# Patient Record
Sex: Male | Born: 1970 | Race: Black or African American | Hispanic: No | Marital: Married | Smoking: Never smoker
Health system: Southern US, Community
[De-identification: ages and names within clinical notes are randomized; demographics above are authoritative.]

## PROBLEM LIST (undated history)

## (undated) DIAGNOSIS — I519 Heart disease, unspecified: Principal | ICD-10-CM

## (undated) DIAGNOSIS — E119 Type 2 diabetes mellitus without complications: Secondary | ICD-10-CM

## (undated) DIAGNOSIS — N289 Disorder of kidney and ureter, unspecified: Secondary | ICD-10-CM

## (undated) DIAGNOSIS — E7800 Pure hypercholesterolemia, unspecified: Secondary | ICD-10-CM

## (undated) DIAGNOSIS — I160 Hypertensive urgency: Secondary | ICD-10-CM

## (undated) DIAGNOSIS — R079 Chest pain, unspecified: Secondary | ICD-10-CM

## (undated) DIAGNOSIS — I10 Essential (primary) hypertension: Secondary | ICD-10-CM

## (undated) HISTORY — DX: Essential (primary) hypertension: I10

## (undated) HISTORY — DX: Heart disease, unspecified: I51.9

## (undated) HISTORY — DX: Type 2 diabetes mellitus without complications: E11.9

## (undated) HISTORY — DX: Hypertensive urgency: I16.0

## (undated) HISTORY — DX: Pure hypercholesterolemia, unspecified: E78.00

## (undated) HISTORY — DX: Chest pain, unspecified: R07.9

## (undated) HISTORY — PX: KNEE ARTHROSCOPY: SUR90

---

## 2001-12-18 ENCOUNTER — Encounter: Admission: RE | Admit: 2001-12-18 | Discharge: 2002-03-18 | Payer: Self-pay | Admitting: Pulmonary Disease

## 2005-01-17 ENCOUNTER — Emergency Department (HOSPITAL_COMMUNITY): Admission: EM | Admit: 2005-01-17 | Discharge: 2005-01-17 | Payer: Self-pay | Admitting: Emergency Medicine

## 2005-01-22 ENCOUNTER — Ambulatory Visit: Payer: Self-pay | Admitting: Pulmonary Disease

## 2005-01-22 LAB — CONVERTED CEMR LAB
ALT: 42 units/L — ABNORMAL HIGH (ref 0–40)
AST: 28 units/L (ref 0–37)
BUN: 11 mg/dL (ref 6–23)
CRP, High Sensitivity: 16 — ABNORMAL HIGH (ref 0.00–5.00)
Calcium: 9.7 mg/dL (ref 8.4–10.5)
Chloride: 97 meq/L (ref 96–112)
Creatinine, Ser: 1 mg/dL (ref 0.5–1.7)
Potassium: 4.2 meq/L (ref 3.5–5.5)
TSH: 1.05 microintl units/mL (ref 0.35–5.50)

## 2005-06-17 ENCOUNTER — Encounter: Admission: RE | Admit: 2005-06-17 | Discharge: 2005-06-17 | Payer: Self-pay | Admitting: Family Medicine

## 2005-06-26 ENCOUNTER — Ambulatory Visit (HOSPITAL_COMMUNITY): Admission: RE | Admit: 2005-06-26 | Discharge: 2005-06-26 | Payer: Self-pay | Admitting: Orthopedic Surgery

## 2006-03-12 ENCOUNTER — Ambulatory Visit: Payer: Self-pay | Admitting: Pulmonary Disease

## 2006-10-27 ENCOUNTER — Ambulatory Visit: Payer: Self-pay | Admitting: Pulmonary Disease

## 2006-10-27 LAB — CONVERTED CEMR LAB
AST: 23 units/L (ref 0–37)
Albumin: 4 g/dL (ref 3.5–5.2)
Alkaline Phosphatase: 75 units/L (ref 39–117)
BUN: 10 mg/dL (ref 6–23)
Basophils Absolute: 0 10*3/uL (ref 0.0–0.1)
Basophils Relative: 0.7 % (ref 0.0–1.0)
CO2: 34 meq/L — ABNORMAL HIGH (ref 19–32)
Chloride: 101 meq/L (ref 96–112)
Creatinine, Ser: 1.1 mg/dL (ref 0.4–1.5)
HCT: 41.4 % (ref 39.0–52.0)
Hemoglobin: 14.3 g/dL (ref 13.0–17.0)
MCHC: 34.5 g/dL (ref 30.0–36.0)
Monocytes Absolute: 0.3 10*3/uL (ref 0.2–0.7)
Monocytes Relative: 5 % (ref 3.0–11.0)
Neutrophils Relative %: 56 % (ref 43.0–77.0)
Potassium: 4.1 meq/L (ref 3.5–5.1)
RBC: 5.11 M/uL (ref 4.22–5.81)
RDW: 12.6 % (ref 11.5–14.6)
TSH: 0.83 microintl units/mL (ref 0.35–5.50)
Total Bilirubin: 1.1 mg/dL (ref 0.3–1.2)
Total Protein: 7.2 g/dL (ref 6.0–8.3)

## 2006-11-25 ENCOUNTER — Ambulatory Visit: Payer: Self-pay | Admitting: Endocrinology

## 2007-03-20 ENCOUNTER — Encounter: Payer: Self-pay | Admitting: Endocrinology

## 2007-03-20 DIAGNOSIS — I10 Essential (primary) hypertension: Secondary | ICD-10-CM

## 2007-03-20 DIAGNOSIS — E119 Type 2 diabetes mellitus without complications: Secondary | ICD-10-CM

## 2007-03-20 HISTORY — DX: Type 2 diabetes mellitus without complications: E11.9

## 2007-03-20 HISTORY — DX: Essential (primary) hypertension: I10

## 2007-03-24 ENCOUNTER — Encounter: Admission: RE | Admit: 2007-03-24 | Discharge: 2007-03-24 | Payer: Self-pay | Admitting: Family Medicine

## 2007-06-25 ENCOUNTER — Telehealth: Payer: Self-pay | Admitting: Endocrinology

## 2008-07-21 ENCOUNTER — Ambulatory Visit: Payer: Self-pay | Admitting: Endocrinology

## 2008-07-21 DIAGNOSIS — E7800 Pure hypercholesterolemia, unspecified: Secondary | ICD-10-CM | POA: Insufficient documentation

## 2008-07-21 HISTORY — DX: Pure hypercholesterolemia, unspecified: E78.00

## 2008-07-21 LAB — CONVERTED CEMR LAB
Albumin: 4.1 g/dL (ref 3.5–5.2)
Alkaline Phosphatase: 68 units/L (ref 39–117)
BUN: 11 mg/dL (ref 6–23)
Bilirubin Urine: NEGATIVE
Calcium: 9.5 mg/dL (ref 8.4–10.5)
Cholesterol: 369 mg/dL (ref 0–200)
Creatinine, Ser: 1 mg/dL (ref 0.4–1.5)
Creatinine,U: 69.7 mg/dL
GFR calc Af Amer: 109 mL/min
Glucose, Bld: 408 mg/dL — ABNORMAL HIGH (ref 70–99)
HDL: 39.6 mg/dL (ref >39.0)
Hemoglobin, Urine: NEGATIVE
Ketones, ur: NEGATIVE mg/dL
Leukocytes, UA: NEGATIVE
Microalb Creat Ratio: 5.7 mg/g (ref 0.0–30.0)
Potassium: 3.9 meq/L (ref 3.5–5.1)
Total CHOL/HDL Ratio: 9.3
Total Protein: 7.5 g/dL (ref 6.0–8.3)
Triglycerides: 427 mg/dL (ref 0–149)
Urine Glucose: >=1000 mg/dL — CR
Urobilinogen, UA: 0.2 (ref 0.0–1.0)
VLDL: 85 mg/dL — ABNORMAL HIGH (ref 0–40)

## 2009-08-25 ENCOUNTER — Telehealth: Payer: Self-pay | Admitting: Endocrinology

## 2009-08-28 ENCOUNTER — Ambulatory Visit: Payer: Self-pay | Admitting: Endocrinology

## 2009-08-28 DIAGNOSIS — R079 Chest pain, unspecified: Secondary | ICD-10-CM | POA: Insufficient documentation

## 2009-08-28 HISTORY — DX: Chest pain, unspecified: R07.9

## 2009-08-31 ENCOUNTER — Telehealth (INDEPENDENT_AMBULATORY_CARE_PROVIDER_SITE_OTHER): Payer: Self-pay | Admitting: *Deleted

## 2009-09-04 ENCOUNTER — Ambulatory Visit: Payer: Self-pay | Admitting: Internal Medicine

## 2009-09-04 ENCOUNTER — Encounter (HOSPITAL_COMMUNITY): Admission: RE | Admit: 2009-09-04 | Discharge: 2009-11-07 | Payer: Self-pay | Admitting: Endocrinology

## 2009-09-04 ENCOUNTER — Ambulatory Visit: Payer: Self-pay

## 2009-09-20 ENCOUNTER — Telehealth: Payer: Self-pay | Admitting: Endocrinology

## 2010-02-08 ENCOUNTER — Telehealth: Payer: Self-pay | Admitting: Endocrinology

## 2010-08-07 NOTE — Progress Notes (Signed)
Summary: OV due  Phone Note Outgoing Call   Call placed by: Rebeca Alert MA,  February 08, 2010 8:45 AM Details for Reason: OV due Summary of Call: Per MD, pt is due for OV. Left message to callback office.  Follow-up for Phone Call        pt states that he will callback in to sched  OV Follow-up by: Rebeca Alert MA,  February 09, 2010 4:04 PM

## 2010-08-07 NOTE — Progress Notes (Signed)
  Phone Note Call from Patient Call back at Work Phone [phone]   Caller: Patient Summary of Call: pt called stating that he has made appt for CPX with MD 02/21. pt is requesting refill of Metformin, Actos and Januvia to [facility] [place] [facility] Initial call taken by: Crissie Sickles, [place],  August 25, 2009 3:21 PM    Prescriptions: JANUVIA 100 MG TABS (SITAGLIPTIN PHOSPHATE) qd  #30 Tablet x 11   Entered by:   Crissie Sickles, CMA   Authorized by:   Donavan Foil MD   Signed by:   Crissie Sickles, CMA on 08/25/2009   Method used:   Electronically to        [place]. [address] [phone]* (retail)       [address], [place]  [address]       Ph: [phone] or [phone]       Fax: [phone]   RxID:   [id number] METFORMIN HCL 500 MG XR24H-TAB (METFORMIN HCL) 4 qd  #120 Tablet x 11   Entered by:   Crissie Sickles, CMA   Authorized by:   Donavan Foil MD   Signed by:   Crissie Sickles, CMA on 08/25/2009   Method used:   Electronically to        [place]. [address] [phone]* (retail)       [address], [place]  [address]       Ph: [phone] or [phone]       Fax: [phone]   RxID:   [id number] ACTOS 45 MG TABS (PIOGLITAZONE HCL) qd  #30 Tablet x 11   Entered by:   Crissie Sickles, CMA   Authorized by:   Donavan Foil MD   Signed by:   Crissie Sickles, CMA on 08/25/2009   Method used:   Electronically to        [place]. [address] [phone]* (retail)       [address]       [place], [place]  [address]       Ph: [phone] or [phone]       Fax: [phone]   RxID:   [id number]

## 2010-08-07 NOTE — Progress Notes (Signed)
  Phone Note Call from Patient   Caller: Patient [phone] Summary of Call: pt called requesting RX for new Freestyle meter and test strips to [facility] on [address] Initial call taken by: Crissie Sickles, [place],  September 20, 2009 4:01 PM    New/Updated Medications: FREESTYLE SYSTEM  KIT (BLOOD GLUCOSE MONITORING SUPPL) use as directed FREESTYLE TEST  STRP (GLUCOSE BLOOD) use as directed two times a day Prescriptions: FREESTYLE TEST  STRP (GLUCOSE BLOOD) use as directed two times a day  #60 x 11   Entered by:   Crissie Sickles, CMA   Authorized by:   Donavan Foil MD   Signed by:   Crissie Sickles, CMA on 09/20/2009   Method used:   Electronically to        [place]. [address] [phone]* (retail)       [address]       [place], [place]  [address]       Ph: [phone] or [phone]       Fax: [phone]   RxID:   [id number] FREESTYLE SYSTEM  KIT (BLOOD GLUCOSE MONITORING SUPPL) use as directed  #1 x 0   Entered by:   Crissie Sickles, CMA   Authorized by:   Donavan Foil MD   Signed by:   Crissie Sickles, CMA on 09/20/2009   Method used:   Electronically to        [place]. [address] [phone]* (retail)       [address]       [place], [place]  [address]       Ph: [phone] or [phone]       Fax: [phone]   RxID:   [id number]

## 2010-08-07 NOTE — Assessment & Plan Note (Signed)
Summary: Cardiology Nuclear Study  Nuclear Med Background Indications for Stress Test: Evaluation for Ischemia     Symptoms: Chest Pain, Fatigue    Nuclear Pre-Procedure Cardiac Risk Factors: Hypertension, Lipids, NIDDM Caffeine/Decaff Intake: None NPO After: 8:30 AM Lungs: clear IV 0.9% NS with Angio Cath: 22g     IV Site: (R) Hand IV Started by: Irven Baltimore RN Chest Size (in) 48     Height (in): 75 Weight (lb): 245 BMI: 30.73  Nuclear Med Study 1 or 2 day study:  1 day     Stress Test Type:  Stress Reading MD:  Glori Bickers, MD     Referring MD:  S.Ellison Resting Radionuclide:  Technetium 80m Tetrofosmin     Resting Radionuclide Dose:  11.0 mCi  Stress Radionuclide:  Technetium 69m Tetrofosmin     Stress Radionuclide Dose:  33.0 mCi   Stress Protocol Exercise Time (min):  10:01 min     Max HR:  183 bpm     Predicted Max HR:  [id number] bpm  Max Systolic BP: [id number] mm Hg     Percent Max HR:  100 %     METS: 11.70 Rate Pressure Product:  [id number]    Stress Test Technologist:  Ileene Hutchinson EMT-P     Nuclear Technologist:  Mariann Laster Deal RT-N  Rest Procedure  Myocardial perfusion imaging was performed at rest 45 minutes following the intravenous administration of Myoview Technetium 47m Tetrofosmin.  Stress Procedure  The patient exercised for 10:01 mins .  The patient stopped due to leg pain/sob and denied any chest pain.  There were non specific ST-T wave changes.  Myoview was injected at peak exercise and myocardial perfusion imaging was performed after a brief delay.  QPS Raw Data Images:  Normal; no motion artifact; normal heart/lung ratio. Stress Images:  There is normal uptake in all areas. Rest Images:  Normal homogeneous uptake in all areas of the myocardium. Subtraction (SDS):  Normal Transient Ischemic Dilatation:  1.01  (Normal <1.22)  Lung/Heart Ratio:  .28  (Normal <0.45)  Quantitative Gated Spect Images QGS EDV:  89 ml QGS ESV:  42 ml QGS EF:  53 % QGS cine  images:  Normal.  Findings Normal nuclear study      Overall Impression  Exercise Capacity: Good exercise capacity. BP Response: Hypertensive blood pressure response. (222/101) Clinical Symptoms: There is dyspnea. No chest pain. ECG Impression: No significant ST segment change suggestive of ischemia. Overall Impression: Normal stress nuclear study.  Appended Document: Cardiology Nuclear Study please leave message on phone tree--normal  Appended Document: Cardiology Nuclear Study Message left on phone tree

## 2010-08-07 NOTE — Progress Notes (Signed)
Summary: Nuclear pre procedure  Phone Note Outgoing Call Call back at [facility] Phone [phone]   Call placed by: Valetta Fuller, [place],  August 31, 2009 3:03 PM Call placed to: Patient Summary of Call: Left message with information on Myoview Information Sheet (see scanned document for details).      Nuclear Med Background Indications for Stress Test: Evaluation for Ischemia     Symptoms: Chest Pain    Nuclear Pre-Procedure Cardiac Risk Factors: Hypertension, Lipids, NIDDM Height (in): 75

## 2010-08-07 NOTE — Assessment & Plan Note (Signed)
Summary: MED REFILL--FU--STC   Vital Signs:  Patient profile:   40 year old male Height:      75 inches (190.50 cm) Weight:      258 pounds (117.27 kg) BMI:     32.36 O2 Sat:      96 % on Room air Temp:     97.4 degrees F (36.33 degrees C) oral Pulse rate:   80 / minute BP sitting:   138 / 90  (left arm) Cuff size:   large  Vitals Entered By: Gardenia Phlegm RMA (August 28, 2009 4:23 PM)  O2 Flow:  Room air CC: Follow-up visit/ pt states he is no longer taking Clotrimazole/ CF Is Patient Diabetic? Yes   CC:  Follow-up visit/ pt states he is no longer taking Clotrimazole/ CF.  History of Present Illness: no cbg record, but states cbg's are usually well-controlled.   pt states intermittent episodes of 10-15 minutes of slight pain radiating from the back around both sides of the chest.  no associated sob.  sxs  are non-exertional, and happen at night.  the episode last week was the 1st he had in 6 months.  none since then.   pt says itching of his feet has recurred  Current Medications (verified): 1)  Actos 45 Mg Tabs (Pioglitazone Hcl) .... Qd 2)  Metformin Hcl 500 Mg Xr24h-Tab (Metformin Hcl) .... 4 Qd 3)  Januvia 100 Mg Tabs (Sitagliptin Phosphate) .... Qd 4)  Clotrimazole-Betamethasone 1-0.05 % Crea (Clotrimazole-Betamethasone) .... Three Times A Day As Needed Itching 5)  Lipitor 80 Mg Tabs (Atorvastatin Calcium) .... Qhs  Allergies (verified): No Known Drug Allergies  Past History:  Past Medical History: ANTIHYPERLIPIDEMIC USE, LONG TERM (ICD-V58.69) HYPERCHOLESTEROLEMIA (ICD-272.0) HYPERTENSION (ICD-401.9) DIABETES MELLITUS, TYPE II (ICD-250.00)  Social History: Reviewed history from 07/21/2008 and no changes required. 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  The patient denies chest pain and dyspnea on exertion.         correction: strike "denies chest pain."  Physical Exam  General:   normal appearance.   Neck:  Supple without thyroid enlargement or tenderness.  Chest Wall:  nontender Lungs:  Clear to auscultation bilaterally. Normal respiratory effort.  Heart:  Regular rate and rhythm without murmurs or gallops noted. Normal S1,S2.   Abdomen:  abdomen is soft, nontender.  no hepatosplenomegaly.   not distended.  no hernia  Msk:  muscle bulk and strength are grossly normal.  no obvious joint swelling.  gait is normal and steady  Pulses:  dorsalis pedis intact bilat.  no carotid bruit  Extremities:  no deformity.  no ulcer on the feet.  feet are of normal color and temp.  no edema.  no rash  Neurologic:  cn 2-12 grossly intact.   readily moves all 4's.   sensation is intact to touch on the feet  Skin:  not diaphoretic Psych:  Alert and cooperative; normal mood and affect; normal attention span and concentration.   Additional Exam:  i reviewed ecg   Impression & Recommendations:  Problem # 1:  CHEST PAIN (ICD-786.50) Assessment New  Problem # 2:  DIABETES MELLITUS, TYPE II (XX123456) uncertain control  Problem # 3:  tinea pedis  Other Orders: EKG w/ Interpretation (93000) Cardiolite (Cardiolite) Est. Patient Level IV [id number]) Pulmonary Referral (Pulmonary)  Patient Instructions: 1)  come in for fasting lipids 272.4  a1c 250.00 microalbumin 250.00 liver v58.69.  tsh 250.00  bmet 401.9  psa v76.44. 2)  pending the test results, please continue the same medications for now. 3)  aspirin 1/day. 4)  check treadmill test.  you will be called for an appointment. 5)  clotrimazole cream three times a day for the itching of your feet.

## 2010-11-20 NOTE — Consult Note (Signed)
[facility]                          ENDOCRINOLOGY CONSULTATION   Alexander [name], Alexander [name]                      MRN:          [id number]  DATE:11/25/2006                            DOB:          1971-05-04    REFERRING PHYSICIAN:  Deborra Medina. Lenna Gilford, MD   REASON FOR REFERRAL:  Diabetes.   HISTORY OF PRESENT ILLNESS:  A 40 year old man who reports a four year  history of diabetes.  He is unaware of any chronic complications.  He  was on Glucovance only.  He states that this worked well, but he ran out  of it a few months ago and returned with the very high glucose.  He has  resumed this, and his glucoses have improved at the high 100s.  He  describes his diet and exercise as good.  Symptomatically, he has  moderate weight gain over the past year but no associated numbness of  his feet.   PAST MEDICAL HISTORY:  1. Dyslipidemia.  2. Hypertension, for which he has not required medication so far.   SOCIAL HISTORY:  He is married.  He works as a English as a second language teacher.   FAMILY HISTORY:  Positive for diabetes in both parents.   REVIEW OF SYSTEMS:  Denies chest pain, shortness of breath.   PHYSICAL EXAMINATION:  VITAL SIGNS:  Blood pressure 137/91, heart rate  82, temperature 97.1.  The weight is 251.  GENERAL:  Obese.  No distress.  SKIN:  Not diaphoretic.  No rash.  HEENT:  No proptosis.  No periorbital swelling.  Pharynx is normal.  NECK:  Supple.  No goiter.  CHEST:  Clear to auscultation.  No respiratory distress.  CARDIOVASCULAR:  No edema.  Regular rate and rhythm.  No murmur.  Pedal  pulses are intact.  There is no bruit at the carotid arteries.  EXTREMITIES:  Feet:  Normal color and temperature.  There is no ulcer  present on the feet.  NEUROLOGIC:  Alert and oriented.  Does not appear anxious or depressed.  Sensation is intact to touch on the feet.   LABORATORY STUDIES:  Forwarded by Dr. Lenna Gilford, October 27, 2006 (off the  Glucovance):  Glucose 517, hemoglobin  A1C 12, TSH 0.83.  Urine  microalbumin is negative.   IMPRESSION:  1. History of nonadherence with his medication for type 2 diabetes.      There are no known chronic complications.  2. Weight gain will make therapy more difficult.  3. Hypertension, as noted today.  4. Dyslipidemia, well controlled as of the last check by Dr. Lenna Gilford.   PLAN:  1. We discussed the importance of diet and exercise therapy as well as      the important risks of diabetes.  2. Change Glucovance to Actos 45 mg a day, Glucophage XR 2000 mg a      day, and Jenuvia 100 mg a day.  3. Check lipids with next laboratory studies.  4. Return in 30 days.     Sean A. Loanne Drilling, MD  Electronically Signed    SAE/MedQ  DD: 11/26/2006  DT: 11/27/2006  Job #: [id number]   cc:  Deborra Medina. Lenna Gilford, MD

## 2010-11-23 NOTE — Op Note (Signed)
Alexander [name], Alexander [name]             ACCOUNT NO.:  [id number]   MEDICAL RECORD NO.:  [id number]          PATIENT TYPE:  AMB   LOCATION:  DAY                          FACILITY:  [facility]   PHYSICIAN:  Gaynelle Arabian, M.D.    DATE OF BIRTH:  08-May-1971   DATE OF PROCEDURE:  06/26/2005  DATE OF DISCHARGE:                                 OPERATIVE REPORT   PREOPERATIVE DIAGNOSIS:  Right knee medial meniscal tear, bucket handle.   POSTOPERATIVE DIAGNOSIS:  Right knee medial meniscal tear, bucket handle.   PROCEDURE:  Right knee arthroscopy with meniscal debridement.   SURGEON:  Dr. Wynelle Link.   ASSISTANT:  None.   ANESTHESIA:  General.   ESTIMATED BLOOD LOSS:  Minimal.   DRAINS:  None.   COMPLICATIONS:  None.   CONDITION:  Stable to recovery.   BRIEF CLINICAL NOTE:  Alexander [name] is a 40 year old male, who had an injury  approximately 2 weeks ago, sustaining a medial meniscal tear.  He has had  progressive pain and mechanical symptoms.  He presents now for arthroscopy  and debridement.   PROCEDURE IN DETAIL:  After the successful administration of general  anesthetic, a tourniquet is placed high on the right thigh and right lower  extremity prepped and draped in the usual sterile fashion.  Standard  superomedial and inferolateral incisions are made.  Inflow cannula is passed  superomedial and camera passed inferolateral.  Arthroscopic visualization  proceeds.  Undersurface of patella and the trochlea look normal.  Medial and  lateral gutters look normal.  Flexion and valgus force is applied to the  knee, and the medial compartment is entered.  He has evidence of a bucket-  handle tear of the medial meniscus.  Spinal needle is used to localize the  inferomedial portal, small incision made, dilator placed, and a probe  placed.  The tear is unstable.  It is at the body going towards the  posterior horn.  It is flipped outside the joint into the medial gutter.  I  reduced it, and then we used  the arthroscopic scissor to cut off the  fragment.  It only represented about 30% of the posterior horn.  The  fragment is removed from the main body of the meniscus and then with a  grabbing forceps, I was able to remove the fragment in 1 large piece.  We  then debrided the posterior horn and body back to a stable base with a  combination of baskets and a 4.2 mm shaver.  I then used the ArthroCare  device to seal the edge of the meniscus.  The chondral surfaces of the  medial femoral condyle and tibial plateau were normal.  Intercondylar notch  is visualized.  ACL is normal.  Lateral compartment is entered, and it is  normal.  Arthroscopic equipment is removed from the inferior portals which  are  closed with interrupted 4-0 nylon.  Marcaine 20 mL 0.25% with epinephrine  injected through the inflow cannula.  Then that is removed and that portal  closed with nylon.  A bulky sterile dressing is applied, and he is awakened  and transported to recovery in stable condition.      Gaynelle Arabian, M.D.  Electronically Signed     FA/MEDQ  D:  06/26/2005  T:  06/28/2005  Job:  [id number]

## 2011-01-08 ENCOUNTER — Encounter: Payer: Self-pay | Admitting: Endocrinology

## 2011-01-08 ENCOUNTER — Other Ambulatory Visit: Payer: Self-pay | Admitting: Endocrinology

## 2011-01-08 ENCOUNTER — Telehealth: Payer: Self-pay | Admitting: *Deleted

## 2011-01-08 ENCOUNTER — Ambulatory Visit (INDEPENDENT_AMBULATORY_CARE_PROVIDER_SITE_OTHER): Payer: BC Managed Care – PPO | Admitting: Endocrinology

## 2011-01-08 ENCOUNTER — Other Ambulatory Visit (INDEPENDENT_AMBULATORY_CARE_PROVIDER_SITE_OTHER): Payer: BC Managed Care – PPO

## 2011-01-08 DIAGNOSIS — Z79899 Other long term (current) drug therapy: Secondary | ICD-10-CM

## 2011-01-08 DIAGNOSIS — E119 Type 2 diabetes mellitus without complications: Secondary | ICD-10-CM

## 2011-01-08 DIAGNOSIS — E7800 Pure hypercholesterolemia, unspecified: Secondary | ICD-10-CM

## 2011-01-08 DIAGNOSIS — I10 Essential (primary) hypertension: Secondary | ICD-10-CM

## 2011-01-08 LAB — URINALYSIS, ROUTINE W REFLEX MICROSCOPIC
Bilirubin Urine: NEGATIVE
Ketones, ur: NEGATIVE
Leukocytes, UA: NEGATIVE
pH: 5.5 (ref 5.0–8.0)

## 2011-01-08 LAB — HEPATIC FUNCTION PANEL
ALT: 28 U/L (ref 0–53)
AST: 24 U/L (ref 0–37)
Albumin: 4.1 g/dL (ref 3.5–5.2)
Alkaline Phosphatase: 68 U/L (ref 39–117)
Total Protein: 7.4 g/dL (ref 6.0–8.3)

## 2011-01-08 LAB — BASIC METABOLIC PANEL
BUN: 15 mg/dL (ref 6–23)
GFR: 94.8 mL/min (ref >60.00)
Glucose, Bld: 599 mg/dL (ref 70–99)
Potassium: 4.3 mEq/L (ref 3.5–5.1)

## 2011-01-08 LAB — LIPID PANEL
Cholesterol: 396 mg/dL — ABNORMAL HIGH (ref 0–200)
HDL: 53 mg/dL (ref >39.00)
Total CHOL/HDL Ratio: 7
Triglycerides: 423 mg/dL — ABNORMAL HIGH (ref 0.0–149.0)
VLDL: 84.6 mg/dL — ABNORMAL HIGH (ref 0.0–40.0)

## 2011-01-08 NOTE — Telephone Encounter (Signed)
Lab called with critical on pt-pt's glucose was 599

## 2011-01-08 NOTE — Patient Instructions (Addendum)
blood tests are being ordered for you today.  please call [phone] to hear your test results.  You will be prompted to enter the 9-digit "MRN" number that appears at the top left of this page, followed by #.  Then you will hear the message. pending the test results, please reduce the metformin to just 2 pills per day.  good diet and exercise habits significanly improve the control of your diabetes.  please let me know if you wish to be referred to a dietician.  high blood sugar is very risky to your health.  you should see an eye doctor every year. controlling your blood pressure and cholesterol drastically reduces the damage diabetes does to your body.  this also applies to quitting smoking.  please discuss these with your doctor.  you should take an aspirin every day, unless you have been advised by a doctor not to. Please make a follow-up appointment in 6 months. (update: i left message on phone-tree:  You need insulin.  Refer dm educator.  you will be called with a day and time for an appointment.  i also sent rx for crestor).

## 2011-01-08 NOTE — Progress Notes (Signed)
Subjective:    Patient ID: Alexander [name], male    DOB: 1970/08/22, 40 y.o.   MRN: [id number]  HPI pt states he feels well in general.  no cbg record, but states cbg's are well-controlled.  He has few years of slight intermittent diarrhea, in the context of taking metformin.  No assoc brbpr.   Past Medical History  Diagnosis Date  . DIABETES MELLITUS, TYPE II 03/20/2007  . HYPERCHOLESTEROLEMIA 07/21/2008  . HYPERTENSION 03/20/2007  . CHEST PAIN 08/28/2009    No past surgical history on file.  History   Social History  . Marital Status: Married    Spouse Name: N/A    Number of Children: N/A  . Years of Education: N/A   Occupational History  . [employer]   Social History Main Topics  . Smoking status: Never Smoker   . Smokeless tobacco: Not on file  . Alcohol Use: Not on file  . Drug Use: Not on file  . Sexually Active: Not on file   Other Topics Concern  . Not on file   Social History Narrative  . No narrative on file    Current Outpatient Prescriptions on File Prior to Visit  Medication Sig Dispense Refill  . atorvastatin (LIPITOR) 80 MG tablet Take 80 mg by mouth at bedtime.        Marland Kitchen glucose blood (FREESTYLE LITE) test strip Use as instructed two times a day       . metFORMIN (GLUCOPHAGE-XR) 500 MG 24 hr tablet 2 tablets by mouth daily      . pioglitazone (ACTOS) 45 MG tablet Take 45 mg by mouth daily.        . sitaGLIPtin (JANUVIA) 100 MG tablet Take 100 mg by mouth daily.          No Known Allergies  Family History  Problem Relation Age of Onset  . Diabetes Mother   . Diabetes Father     BP 122/74  Pulse 96  Temp(Src) 98.4 F (36.9 C) (Oral)  Ht 6' 2.5" (1.892 m)  Wt 238 lb 9.6 oz (108.228 kg)  BMI 30.22 kg/m2  SpO2 98%    Review of Systems  Constitutional: Negative for unexpected weight change.  Eyes: Negative for visual disturbance.  Respiratory: Negative for shortness of breath.   Cardiovascular: Negative for chest pain.    Gastrointestinal: Negative for abdominal pain.  Genitourinary: Negative for frequency.  Musculoskeletal: Negative for gait problem.  Skin: Negative for wound.  Neurological: Negative for syncope.  Psychiatric/Behavioral: Negative for dysphoric mood. The patient is not nervous/anxious.        Objective:   Physical Exam GENERAL: no distress head: no deformity eyes: no periorbital swelling, no proptosis external nose and ears are normal mouth: no lesion seen Neck - No masses or thyromegaly or limitation in range of motion LUNGS:  Clear to auscultation HEART:  Regular rate and rhythm without murmurs noted. Normal S1,S2.   Pulses: dorsalis pedis intact bilat.   Feet: no deformity.  no ulcer on the feet.  feet are of normal color and temp.  no edema Neuro: sensation is intact to touch on the feet      Lab Results  Component Value Date   HGBA1C 15.7* 01/08/2011   Lab Results  Component Value Date   CHOL 396* 01/08/2011   CHOL 369* 07/21/2008   Lab Results  Component Value Date   HDL 53.00 01/08/2011   HDL 39.6 07/21/2008   No results found for this basename:  [facility]   Lab Results  Component Value Date   TRIG 423.0* 01/08/2011   TRIG 427* 07/21/2008   Lab Results  Component Value Date   CHOLHDL 7 01/08/2011   CHOLHDL 9.3 CALC 07/21/2008   Lab Results  Component Value Date   LDLDIRECT 271.5 01/08/2011   LDLDIRECT 231.5 07/21/2008    Assessment & Plan:  Dm.  Very poor control.  He needs insulin Diarrhea, due to metformin, new Dyslipidemia, severe.  High risk for ascvd.

## 2011-01-09 MED ORDER — ROSUVASTATIN CALCIUM 10 MG PO TABS: 10.0000 mg | ORAL_TABLET | Freq: Every day | ORAL | Status: DC

## 2011-01-10 ENCOUNTER — Telehealth: Payer: Self-pay | Admitting: *Deleted

## 2011-01-10 NOTE — Telephone Encounter (Signed)
Left message for pt to callback office.  

## 2011-01-10 NOTE — Telephone Encounter (Signed)
Message copied by Legrand Como on Thu Jan 10, 2011 10:05 AM ------      Message from: Renato Shin      Created: Wed Jan 09, 2011  5:53 PM       please call patient:      i also sent rx for cholesterol, as his is very high

## 2011-01-11 NOTE — Telephone Encounter (Signed)
Left message for pt to callback office.  

## 2011-01-14 ENCOUNTER — Other Ambulatory Visit: Payer: Self-pay | Admitting: *Deleted

## 2011-01-14 MED ORDER — PIOGLITAZONE HCL 45 MG PO TABS: 45.0000 mg | ORAL_TABLET | Freq: Every day | ORAL | Status: DC

## 2011-01-14 MED ORDER — SITAGLIPTIN PHOSPHATE 100 MG PO TABS: 100.0000 mg | ORAL_TABLET | Freq: Every day | ORAL | Status: DC

## 2011-01-14 MED ORDER — METFORMIN HCL ER 500 MG PO TB24: ORAL_TABLET | ORAL | Status: DC

## 2011-01-14 NOTE — Telephone Encounter (Signed)
PT requesting refill of Metformin, Actos and Januvia be sent to [facility].

## 2011-01-14 NOTE — Telephone Encounter (Signed)
Pt informed of MD's advisement and of new rx.

## 2011-07-16 ENCOUNTER — Ambulatory Visit: Payer: BC Managed Care – PPO | Admitting: Endocrinology

## 2011-07-16 DIAGNOSIS — Z0289 Encounter for other administrative examinations: Secondary | ICD-10-CM

## 2011-08-13 ENCOUNTER — Ambulatory Visit: Payer: Self-pay | Admitting: Endocrinology

## 2011-10-02 ENCOUNTER — Ambulatory Visit (INDEPENDENT_AMBULATORY_CARE_PROVIDER_SITE_OTHER): Payer: BC Managed Care – PPO | Admitting: Endocrinology

## 2011-10-02 ENCOUNTER — Other Ambulatory Visit (INDEPENDENT_AMBULATORY_CARE_PROVIDER_SITE_OTHER): Payer: BC Managed Care – PPO

## 2011-10-02 ENCOUNTER — Encounter: Payer: Self-pay | Admitting: Endocrinology

## 2011-10-02 VITALS — BP 132/82 | HR 98 | Temp 98.6°F | Ht 75.0 in | Wt 238.0 lb

## 2011-10-02 DIAGNOSIS — E119 Type 2 diabetes mellitus without complications: Secondary | ICD-10-CM

## 2011-10-02 NOTE — Patient Instructions (Addendum)
blood tests are being requested for you today.  You will receive a letter with results. Please consider the insulin, and call if you want to start.  good diet and exercise habits significanly improve the control of your diabetes.  please let me know if you wish to be referred to a dietician.  high blood sugar is very risky to your health.  you should see an eye doctor every year. controlling your blood pressure and cholesterol drastically reduces the damage diabetes does to your body.  this also applies to quitting smoking.  please discuss these with your doctor.  you should take an aspirin every day, unless you have been advised by a doctor not to. check your blood sugar 2 times a day.  vary the time of day when you check, between before the 3 meals, and at bedtime.  also check if you have symptoms of your blood sugar being too high or too low.  please keep a record of the readings and bring it to your next appointment here.  please call us sooner if your blood sugar goes below 70, or if it stays over 200. (see letter)

## 2011-10-02 NOTE — Progress Notes (Signed)
  Subjective:    Patient ID: Alexander [name], male    DOB: [birth year], 41 y.o.   MRN: [id number]  HPI Pt returns for f/u of type 2 DM (2004).  He has been advised to take insulin, but has not done so.  He takes 3 oral meds.  no cbg record, but states cbg's are "high." Past Medical History  Diagnosis Date  . DIABETES MELLITUS, TYPE II 03/20/2007  . HYPERCHOLESTEROLEMIA 07/21/2008  . HYPERTENSION 03/20/2007  . CHEST PAIN 08/28/2009    No past surgical history on file.  History   Social History  . Marital Status: Married    Spouse Name: N/A    Number of Children: N/A  . Years of Education: N/A   Occupational History  . [employer]   Social History Main Topics  . Smoking status: Never Smoker   . Smokeless tobacco: Not on file  . Alcohol Use: Not on file  . Drug Use: Not on file  . Sexually Active: Not on file   Other Topics Concern  . Not on file   Social History Narrative  . No narrative on file    Current Outpatient Prescriptions on File Prior to Visit  Medication Sig Dispense Refill  . atorvastatin (LIPITOR) 80 MG tablet Take 80 mg by mouth at bedtime.        Marland Kitchen glucose blood (FREESTYLE LITE) test strip Use as instructed two times a day       . metFORMIN (GLUCOPHAGE-XR) 500 MG 24 hr tablet 2 tablets by mouth daily  120 tablet  3  . pioglitazone (ACTOS) 45 MG tablet Take 1 tablet (45 mg total) by mouth daily.  90 tablet  3  . rosuvastatin (CRESTOR) 10 MG tablet Take 1 tablet (10 mg total) by mouth at bedtime.  30 tablet  11  . sitaGLIPtin (JANUVIA) 100 MG tablet Take 1 tablet (100 mg total) by mouth daily.  90 tablet  3    No Known Allergies  Family History  Problem Relation Age of Onset  . Diabetes Mother   . Diabetes Father     BP 132/82  Pulse 98  Temp(Src) 98.6 F (37 C) (Oral)  Ht 6\' 3"  (1.905 m)  Wt 238 lb (107.956 kg)  BMI 29.75 kg/m2  SpO2 98%  Review of Systems Denies weight change.      Objective:   Physical Exam VITAL SIGNS:  See vs  page GENERAL: no distress Pulses: dorsalis pedis intact bilat.   Feet: no deformity.  no ulcer on the feet.  feet are of normal color and temp.  no edema. Neuro: sensation is intact to touch on the feet.      Lab Results  Component Value Date   HGBA1C 15.3* 10/02/2011      Assessment & Plan:  Dm with severe hyperglycemia.  He has declined insulin, but he says he will now take it qd.  i demonstrated humalog 75/25 pen

## 2011-10-03 ENCOUNTER — Telehealth: Payer: Self-pay | Admitting: *Deleted

## 2011-10-03 NOTE — Telephone Encounter (Signed)
Called pt to inform of A1c results. Left message for pt to callback office. (Letter also mailed to pt) 

## 2011-10-08 NOTE — Telephone Encounter (Signed)
Left message on machine for pt to return my call regarding lab results.

## 2011-10-09 NOTE — Telephone Encounter (Signed)
Pt informed of lab results. 

## 2011-10-23 ENCOUNTER — Telehealth: Payer: Self-pay | Admitting: *Deleted

## 2011-10-23 NOTE — Telephone Encounter (Signed)
Ok to increase and send.  It is extremely unlikely that your blood sugar will be good unless you take insulin.

## 2011-10-23 NOTE — Telephone Encounter (Signed)
Pt wants to increase Metformin rx back to 4 tablets daily (2 pills twice daily). He has been taking 2 tablets a day but he feels that it is not as effective for controlling his CBG's as four times daily. If MD agrees, pt would like rx for Metformin 4 tablets daily for 90 day supply sent to [facility].

## 2011-10-24 MED ORDER — METFORMIN HCL ER 500 MG PO TB24: ORAL_TABLET | ORAL | Status: DC

## 2011-10-24 NOTE — Telephone Encounter (Signed)
Rx sent to pharmacy, pt informed of MD's advisement.

## 2012-01-01 ENCOUNTER — Ambulatory Visit: Payer: BC Managed Care – PPO | Admitting: Endocrinology

## 2012-01-14 ENCOUNTER — Ambulatory Visit (INDEPENDENT_AMBULATORY_CARE_PROVIDER_SITE_OTHER): Payer: BC Managed Care – PPO | Admitting: Endocrinology

## 2012-01-14 ENCOUNTER — Encounter: Payer: Self-pay | Admitting: Endocrinology

## 2012-01-14 VITALS — BP 130/82 | HR 96 | Temp 98.4°F | Ht 75.0 in | Wt 228.0 lb

## 2012-01-14 DIAGNOSIS — H579 Unspecified disorder of eye and adnexa: Secondary | ICD-10-CM

## 2012-01-14 DIAGNOSIS — E1039 Type 1 diabetes mellitus with other diabetic ophthalmic complication: Secondary | ICD-10-CM

## 2012-01-14 DIAGNOSIS — E1065 Type 1 diabetes mellitus with hyperglycemia: Secondary | ICD-10-CM

## 2012-01-14 NOTE — Patient Instructions (Addendum)
Start levemir, 10 units each morning.   Stop the 3 diabetes pills. check your blood sugar 2 times a day.  vary the time of day when you check, between before the 3 meals, and at bedtime.  also check if you have symptoms of your blood sugar being too high or too low.  please keep a record of the readings and bring it to your next appointment here.  please call us sooner if your blood sugar goes below 70, or if it stays over 200. Please come back for a follow-up appointment in 2-3 weeks.

## 2012-01-14 NOTE — Progress Notes (Signed)
  Subjective:    Patient ID: Alexander [name], male    DOB: 05/28/1971, 41 y.o.   MRN: [id number]  HPI Pt returns for f/u of type 2 DM (dx'ed 123XX123; complicated by retinopathy).  He has been advised to take insulin, but has not done so.  He takes 3 oral meds.  no cbg record, but states cbg's are "high." Past Medical History  Diagnosis Date  . DIABETES MELLITUS, TYPE II 03/20/2007  . HYPERCHOLESTEROLEMIA 07/21/2008  . HYPERTENSION 03/20/2007  . CHEST PAIN 08/28/2009    No past surgical history on file.  History   Social History  . Marital Status: Married    Spouse Name: N/A    Number of Children: N/A  . Years of Education: N/A   Occupational History  . [employer]   Social History Main Topics  . Smoking status: Never Smoker   . Smokeless tobacco: Not on file  . Alcohol Use: Not on file  . Drug Use: Not on file  . Sexually Active: Not on file   Other Topics Concern  . Not on file   Social History Narrative  . No narrative on file    Current Outpatient Prescriptions on File Prior to Visit  Medication Sig Dispense Refill  . atorvastatin (LIPITOR) 80 MG tablet Take 80 mg by mouth at bedtime.        Marland Kitchen glucose blood (FREESTYLE LITE) test strip Use as instructed two times a day       . insulin detemir (LEVEMIR) 100 UNIT/ML injection Inject 10 Units into the skin every morning.       . rosuvastatin (CRESTOR) 10 MG tablet Take 1 tablet (10 mg total) by mouth at bedtime.  30 tablet  11   No Known Allergies  Family History  Problem Relation Age of Onset  . Diabetes Mother   . Diabetes Father    BP 130/82  Pulse 96  Temp 98.4 F (36.9 C) (Oral)  Ht 6\' 3"  (1.905 m)  Wt 228 lb (103.42 kg)  BMI 28.50 kg/m2  SpO2 97%   Review of Systems Denies weight change    Objective:   Physical Exam VITAL SIGNS:  See vs page GENERAL: no distress PSYCH: Alert and oriented x 3.  Does not appear anxious nor depressed.     Assessment & Plan:  DM, very poor control.  Pt now  agrees to start insulin.  i have demonstrated insulin pen technique for him.

## 2012-02-06 ENCOUNTER — Encounter: Payer: Self-pay | Admitting: Endocrinology

## 2012-02-06 ENCOUNTER — Ambulatory Visit (INDEPENDENT_AMBULATORY_CARE_PROVIDER_SITE_OTHER): Payer: BC Managed Care – PPO | Admitting: Endocrinology

## 2012-02-06 VITALS — BP 128/82 | HR 81 | Temp 97.7°F | Wt 230.0 lb

## 2012-02-06 DIAGNOSIS — E1065 Type 1 diabetes mellitus with hyperglycemia: Secondary | ICD-10-CM

## 2012-02-06 DIAGNOSIS — E1039 Type 1 diabetes mellitus with other diabetic ophthalmic complication: Secondary | ICD-10-CM

## 2012-02-06 DIAGNOSIS — H579 Unspecified disorder of eye and adnexa: Secondary | ICD-10-CM

## 2012-02-06 NOTE — Progress Notes (Signed)
  Subjective:    Patient ID: Alexander [name], male    DOB: 30-May-1971, 41 y.o.   MRN: [id number]  HPI Pt has increased his levemir to 30 units qam.  As he has increased it, he has noted cbg's improve to the 200's.   It is in general highest in am, and lower in the afternoon.   Past Medical History  Diagnosis Date  . DIABETES MELLITUS, TYPE II 03/20/2007  . HYPERCHOLESTEROLEMIA 07/21/2008  . HYPERTENSION 03/20/2007  . CHEST PAIN 08/28/2009    No past surgical history on file.  History   Social History  . Marital Status: Married    Spouse Name: N/A    Number of Children: N/A  . Years of Education: N/A   Occupational History  . [employer]   Social History Main Topics  . Smoking status: Never Smoker   . Smokeless tobacco: Not on file  . Alcohol Use: Not on file  . Drug Use: Not on file  . Sexually Active: Not on file   Other Topics Concern  . Not on file   Social History Narrative  . No narrative on file    Current Outpatient Prescriptions on File Prior to Visit  Medication Sig Dispense Refill  . atorvastatin (LIPITOR) 80 MG tablet Take 80 mg by mouth at bedtime.        Marland Kitchen glucose blood (FREESTYLE LITE) test strip Use as instructed two times a day       . insulin detemir (LEVEMIR) 100 UNIT/ML injection Inject 40 Units into the skin every morning.       . rosuvastatin (CRESTOR) 10 MG tablet Take 1 tablet (10 mg total) by mouth at bedtime.  30 tablet  11    No Known Allergies  Family History  Problem Relation Age of Onset  . Diabetes Mother   . Diabetes Father     BP 128/82  Pulse 81  Temp 97.7 F (36.5 C) (Oral)  Wt 230 lb (104.327 kg)  SpO2 98%  Review of Systems denies hypoglycemia.      Objective:   Physical Exam VITAL SIGNS:  See vs page GENERAL: no distress.   SKIN:  Insulin injection sites at the anterior abdomen are normal.       Assessment & Plan:  DM.  needs increased rx

## 2012-02-06 NOTE — Patient Instructions (Addendum)
increase levemir to 40 units each morning.  Then continue to increase until the blood sugar comes down to the low-100's at different times of day check your blood sugar 2 times a day.  vary the time of day when you check, between before the 3 meals, and at bedtime.  also check if you have symptoms of your blood sugar being too high or too low.  please keep a record of the readings and bring it to your next appointment here.  please call us sooner if your blood sugar goes below 70, or if it stays over 200.   Please come back for a follow-up appointment in 1 month.

## 2012-03-12 ENCOUNTER — Telehealth: Payer: Self-pay | Admitting: *Deleted

## 2012-03-12 NOTE — Telephone Encounter (Signed)
Pt called for samples of Levemir insulin. Pt has appointment 04/03/2012-pt informed samples are available here for pickup at office (side B larger fridge at [facility]).

## 2012-03-25 ENCOUNTER — Other Ambulatory Visit: Payer: Self-pay | Admitting: *Deleted

## 2012-03-25 MED ORDER — INSULIN DETEMIR 100 UNIT/ML ~~LOC~~ SOLN: 40.0000 [IU] | SUBCUTANEOUS | Status: DC

## 2012-03-25 NOTE — Telephone Encounter (Signed)
Pt request Insulin refill in Flexpen form/SLS

## 2012-04-02 ENCOUNTER — Other Ambulatory Visit: Payer: Self-pay | Admitting: *Deleted

## 2012-04-02 DIAGNOSIS — E1065 Type 1 diabetes mellitus with hyperglycemia: Secondary | ICD-10-CM

## 2012-04-02 DIAGNOSIS — E1039 Type 1 diabetes mellitus with other diabetic ophthalmic complication: Secondary | ICD-10-CM

## 2012-04-02 MED ORDER — INSULIN PEN NEEDLE 31G X 8 MM MISC: Status: DC

## 2012-04-03 ENCOUNTER — Encounter: Payer: Self-pay | Admitting: Endocrinology

## 2012-04-03 ENCOUNTER — Ambulatory Visit (INDEPENDENT_AMBULATORY_CARE_PROVIDER_SITE_OTHER): Payer: BC Managed Care – PPO | Admitting: Endocrinology

## 2012-04-03 VITALS — BP 136/80 | HR 95 | Temp 98.2°F | Ht 75.0 in | Wt 230.0 lb

## 2012-04-03 DIAGNOSIS — E1065 Type 1 diabetes mellitus with hyperglycemia: Secondary | ICD-10-CM

## 2012-04-03 DIAGNOSIS — E1039 Type 1 diabetes mellitus with other diabetic ophthalmic complication: Secondary | ICD-10-CM

## 2012-04-03 DIAGNOSIS — H579 Unspecified disorder of eye and adnexa: Secondary | ICD-10-CM

## 2012-04-03 MED ORDER — INSULIN DETEMIR 100 UNIT/ML ~~LOC~~ SOLN: 55.0000 [IU] | SUBCUTANEOUS | Status: DC

## 2012-04-03 NOTE — Patient Instructions (Addendum)
increase levemir to 55 units each morning.  Then continue to increase until the blood sugar comes down to the low-100's at different times of day check your blood sugar 2 times a day.  vary the time of day when you check, between before the 3 meals, and at bedtime.  also check if you have symptoms of your blood sugar being too high or too low.  please keep a record of the readings and bring it to your next appointment here.  please call us sooner if your blood sugar goes below 70, or if it stays over 200.   Please come back for a follow-up appointment in 6 weeks.

## 2012-04-03 NOTE — Progress Notes (Signed)
  Subjective:    Patient ID: Alexander [name], male    DOB: 01/01/1971, 41 y.o.   MRN: [id number]  HPI Pt returns for f/u of type 2 DM (dx'ed 123XX123; complicated by retinopathy). Pt has increased his levemir to 45 units qam.  no cbg record, but states cbg's are still in the 200's.  It is in general higher as the day goes on. Past Medical History  Diagnosis Date  . DIABETES MELLITUS, TYPE II 03/20/2007  . HYPERCHOLESTEROLEMIA 07/21/2008  . HYPERTENSION 03/20/2007  . CHEST PAIN 08/28/2009    No past surgical history on file.  History   Social History  . Marital Status: Married    Spouse Name: N/A    Number of Children: N/A  . Years of Education: N/A   Occupational History  . [employer]   Social History Main Topics  . Smoking status: Never Smoker   . Smokeless tobacco: Not on file  . Alcohol Use: Not on file  . Drug Use: Not on file  . Sexually Active: Not on file   Other Topics Concern  . Not on file   Social History Narrative  . No narrative on file    Current Outpatient Prescriptions on File Prior to Visit  Medication Sig Dispense Refill  . atorvastatin (LIPITOR) 80 MG tablet Take 80 mg by mouth at bedtime.        Marland Kitchen glucose blood (FREESTYLE LITE) test strip Use as instructed two times a day       . Insulin Pen Needle 31G X 8 MM MISC Use as directed once daily dx 250.53  100 each  3  . DISCONTD: insulin detemir (LEVEMIR) 100 UNIT/ML injection Inject 40 Units into the skin every morning. Please dispense Flexpen.  15 mL  1  . DISCONTD: rosuvastatin (CRESTOR) 10 MG tablet Take 10 mg by mouth daily.      . rosuvastatin (CRESTOR) 10 MG tablet Take 1 tablet (10 mg total) by mouth at bedtime.  30 tablet  11    No Known Allergies  Family History  Problem Relation Age of Onset  . Diabetes Mother   . Diabetes Father     BP 136/80  Pulse 95  Temp 98.2 F (36.8 C) (Oral)  Ht 6\' 3"  (1.905 m)  Wt 230 lb (104.327 kg)  BMI 28.75 kg/m2  SpO2 97%    Review of  Systems denies hypoglycemia    Objective:   Physical Exam VITAL SIGNS:  See vs page GENERAL: no distress Pulses: dorsalis pedis intact bilat.   Feet: no deformity.  no ulcer on the feet.  feet are of normal color and temp.  no edema Neuro: sensation is intact to touch on the feet      Assessment & Plan:  DM: needs increased rx

## 2012-05-15 ENCOUNTER — Ambulatory Visit: Payer: BC Managed Care – PPO | Admitting: Endocrinology

## 2012-05-26 ENCOUNTER — Ambulatory Visit (INDEPENDENT_AMBULATORY_CARE_PROVIDER_SITE_OTHER): Payer: BC Managed Care – PPO | Admitting: Endocrinology

## 2012-05-26 ENCOUNTER — Encounter: Payer: Self-pay | Admitting: Endocrinology

## 2012-05-26 VITALS — BP 136/80 | HR 90 | Temp 98.5°F | Wt 237.0 lb

## 2012-05-26 DIAGNOSIS — E1065 Type 1 diabetes mellitus with hyperglycemia: Secondary | ICD-10-CM

## 2012-05-26 DIAGNOSIS — R209 Unspecified disturbances of skin sensation: Secondary | ICD-10-CM

## 2012-05-26 DIAGNOSIS — E291 Testicular hypofunction: Secondary | ICD-10-CM

## 2012-05-26 DIAGNOSIS — E1039 Type 1 diabetes mellitus with other diabetic ophthalmic complication: Secondary | ICD-10-CM

## 2012-05-26 DIAGNOSIS — N529 Male erectile dysfunction, unspecified: Secondary | ICD-10-CM

## 2012-05-26 DIAGNOSIS — R200 Anesthesia of skin: Secondary | ICD-10-CM | POA: Insufficient documentation

## 2012-05-26 NOTE — Patient Instructions (Addendum)
Please increase levemir to 65 units each morning.  Then continue to increase until the blood sugar comes down to the low-100's at different times of day check your blood sugar 2 times a day.  vary the time of day when you check, between before the 3 meals, and at bedtime.  also check if you have symptoms of your blood sugar being too high or too low.  please keep a record of the readings and bring it to your next appointment here.  please call us sooner if your blood sugar goes below 70, or if you have a lot of readings over 200.   Please come back for a follow-up appointment in 6 weeks.   blood tests are being requested for you today.  We'll contact you with results.   Some specialists say that taking folic acid, 4 mg daily, helps the tingling.

## 2012-05-26 NOTE — Progress Notes (Signed)
  Subjective:    Patient ID: Alexander [name], male    DOB: [birth year], 41 y.o.   MRN: [id number]  HPI Pt returns for f/u of type 2 DM (dx'ed 123XX123; complicated by retinopathy; he has requested qd insulin). Pt has increased his levemir to 55 units qam.  no cbg record, but states cbg's are still in the low-200's.  It is in general higher as the day goes on.  Pt says he has a few mos of slight tingling of the feet, and assoc numbness Past Medical History  Diagnosis Date  . DIABETES MELLITUS, TYPE II 03/20/2007  . HYPERCHOLESTEROLEMIA 07/21/2008  . HYPERTENSION 03/20/2007  . CHEST PAIN 08/28/2009    No past surgical history on file.  History   Social History  . Marital Status: Married    Spouse Name: N/A    Number of Children: N/A  . Years of Education: N/A   Occupational History  . [employer]   Social History Main Topics  . Smoking status: Never Smoker   . Smokeless tobacco: Not on file  . Alcohol Use: Not on file  . Drug Use: Not on file  . Sexually Active: Not on file   Other Topics Concern  . Not on file   Social History Narrative  . No narrative on file    Current Outpatient Prescriptions on File Prior to Visit  Medication Sig Dispense Refill  . atorvastatin (LIPITOR) 80 MG tablet Take 80 mg by mouth at bedtime.        Marland Kitchen glucose blood (FREESTYLE LITE) test strip Use as instructed two times a day       . insulin detemir (LEVEMIR) 100 UNIT/ML injection Inject 65 Units into the skin every morning. Please dispense Flexpen.      . Insulin Pen Needle 31G X 8 MM MISC Use as directed once daily dx 250.53  100 each  3  . rosuvastatin (CRESTOR) 10 MG tablet Take 1 tablet (10 mg total) by mouth at bedtime.  30 tablet  11    No Known Allergies  Family History  Problem Relation Age of Onset  . Diabetes Mother   . Diabetes Father     BP 136/80  Pulse 90  Temp 98.5 F (36.9 C) (Oral)  Wt 237 lb (107.502 kg)  SpO2 98%  Review of Systems denies hypoglycemia, but he  has ED sxs.    Objective:   Physical Exam VITAL SIGNS:  See vs page GENERAL: no distress. Pulses: dorsalis pedis intact bilat.   Feet: no deformity.  no ulcer on the feet.  feet are of normal color and temp.  no edema Neuro: sensation is intact to touch on the feet, but decreased from normal.   Lab Results  Component Value Date   HGBA1C 11.4* 05/26/2012   Lab Results  Component Value Date   TESTOSTERONE 219.47* 05/26/2012      Assessment & Plan:  DM, improved but needs increased rx Hypogonadism, new, central, uncertain etiology Paresthesias, possibly due to DM

## 2012-05-27 DIAGNOSIS — E291 Testicular hypofunction: Secondary | ICD-10-CM | POA: Insufficient documentation

## 2012-05-27 LAB — PROLACTIN: Prolactin: 12.1 ng/mL (ref 2.1–17.1)

## 2012-05-27 LAB — HEMOGLOBIN A1C: Hgb A1c MFr Bld: 11.4 % — ABNORMAL HIGH (ref <5.7)

## 2012-06-01 ENCOUNTER — Telehealth: Payer: Self-pay | Admitting: *Deleted

## 2012-06-01 NOTE — Telephone Encounter (Signed)
PATIENT NOTIFIED OF LAB RESULTS OF TESTOSTERONE AND REQUEST TO GO ON MEDICATION FOR THIS . PATIENT AWARE OF HGBA1C RESULTS AND STATES IS TAKING INSULIN AS YOU HAD INSTRUCTED AT LAST VISIT. PATIENT PHARMACY [facility].

## 2012-06-01 NOTE — Telephone Encounter (Signed)
Message copied by Ellene Route on Mon Jun 01, 2012 11:14 AM ------      Message from: Renato Shin      Created: Thu May 28, 2012 11:44 AM       please call patient:      The cause of your low testosterone is uncertain,  However, i can send a prescription to your pharmacy for a pill to help it, if you wish.  Please let me know.

## 2012-06-02 MED ORDER — CLOMIPHENE CITRATE 50 MG PO TABS: ORAL_TABLET | ORAL | Status: DC

## 2012-06-02 NOTE — Telephone Encounter (Signed)
Patient notified of medication called [facility] pharmacy.

## 2012-06-02 NOTE — Telephone Encounter (Signed)
i sent rx 

## 2012-07-15 ENCOUNTER — Ambulatory Visit (INDEPENDENT_AMBULATORY_CARE_PROVIDER_SITE_OTHER): Payer: BC Managed Care – PPO | Admitting: Endocrinology

## 2012-07-15 ENCOUNTER — Encounter: Payer: Self-pay | Admitting: Endocrinology

## 2012-07-15 VITALS — BP 122/80 | HR 88 | Wt 240.0 lb

## 2012-07-15 DIAGNOSIS — E1039 Type 1 diabetes mellitus with other diabetic ophthalmic complication: Secondary | ICD-10-CM

## 2012-07-15 DIAGNOSIS — E1065 Type 1 diabetes mellitus with hyperglycemia: Secondary | ICD-10-CM

## 2012-07-15 MED ORDER — FOLIC ACID 7.5 MG PO TABS: 1.0000 | ORAL_TABLET | Freq: Every day | ORAL | Status: DC

## 2012-07-15 NOTE — Patient Instructions (Addendum)
Please increase levemir to 80 units each morning.  Then continue to increase until the blood sugar comes down to the low-100's at different times of day check your blood sugar 2 times a day.  vary the time of day when you check, between before the 3 meals, and at bedtime.  also check if you have symptoms of your blood sugar being too high or too low.  please keep a record of the readings and bring it to your next appointment here.  please call us sooner if your blood sugar goes below 70, or if you have a lot of readings over 200.   Please come back for a follow-up appointment in 6 weeks.

## 2012-07-15 NOTE — Progress Notes (Signed)
  Subjective:    Patient ID: Alexander [name], male    DOB: 18-Sep-1970, 42 y.o.   MRN: [id number]  HPI Pt returns for f/u of type 2 DM (dx'ed 123XX123; complicated by retinopathy; he has requested qd insulin). Pt has increased his levemir to 55 units qam.  He says he does not consistently check cbg's.  When he does check, cbg's are in the high-100's.  pt states he feels well in general, except for ed sxs, and ongoing tingling of the feet. Past Medical History  Diagnosis Date  . DIABETES MELLITUS, TYPE II 03/20/2007  . HYPERCHOLESTEROLEMIA 07/21/2008  . HYPERTENSION 03/20/2007  . CHEST PAIN 08/28/2009    No past surgical history on file.  History   Social History  . Marital Status: Married    Spouse Name: N/A    Number of Children: N/A  . Years of Education: N/A   Occupational History  . [employer]   Social History Main Topics  . Smoking status: Never Smoker   . Smokeless tobacco: Not on file  . Alcohol Use: Not on file  . Drug Use: Not on file  . Sexually Active: Not on file   Other Topics Concern  . Not on file   Social History Narrative  . No narrative on file    Current Outpatient Prescriptions on File Prior to Visit  Medication Sig Dispense Refill  . atorvastatin (LIPITOR) 80 MG tablet Take 80 mg by mouth at bedtime.        . clomiPHENE (CLOMID) 50 MG tablet 1/4 tab daily  10 tablet  2  . glucose blood (FREESTYLE LITE) test strip Use as instructed two times a day       . insulin detemir (LEVEMIR) 100 UNIT/ML injection Inject 80 Units into the skin every morning. Please dispense Flexpen.      . Insulin Pen Needle 31G X 8 MM MISC Use as directed once daily dx 250.53  100 each  3  . rosuvastatin (CRESTOR) 10 MG tablet Take 1 tablet (10 mg total) by mouth at bedtime.  30 tablet  11    No Known Allergies  Family History  Problem Relation Age of Onset  . Diabetes Mother   . Diabetes Father     BP 122/80  Pulse 88  Wt 240 lb (108.863 kg)  SpO2 98%    Review  of Systems denies hypoglycemia    Objective:   Physical Exam VITAL SIGNS:  See vs page GENERAL: no distress GENITALIA: Normal male testicles, scrotum, and penis.     Lab Results  Component Value Date   HGBA1C 11.4* 05/26/2012      Assessment & Plan:  DN, therapy limited by noncompliance.  i'll do the best i can.

## 2012-08-26 ENCOUNTER — Ambulatory Visit: Payer: BC Managed Care – PPO | Admitting: Endocrinology

## 2012-08-26 DIAGNOSIS — Z0289 Encounter for other administrative examinations: Secondary | ICD-10-CM

## 2012-09-02 ENCOUNTER — Ambulatory Visit: Payer: BC Managed Care – PPO | Admitting: Endocrinology

## 2012-09-09 ENCOUNTER — Ambulatory Visit (INDEPENDENT_AMBULATORY_CARE_PROVIDER_SITE_OTHER): Payer: BC Managed Care – PPO | Admitting: Endocrinology

## 2012-09-09 ENCOUNTER — Encounter: Payer: Self-pay | Admitting: Endocrinology

## 2012-09-09 VITALS — BP 128/72 | HR 98 | Wt 247.0 lb

## 2012-09-09 DIAGNOSIS — E1039 Type 1 diabetes mellitus with other diabetic ophthalmic complication: Secondary | ICD-10-CM

## 2012-09-09 DIAGNOSIS — E1065 Type 1 diabetes mellitus with hyperglycemia: Secondary | ICD-10-CM

## 2012-09-09 MED ORDER — INSULIN DETEMIR 100 UNIT/ML ~~LOC~~ SOLN: 100.0000 [IU] | SUBCUTANEOUS | Status: DC

## 2012-09-09 NOTE — Patient Instructions (Addendum)
Please increase levemir to 80 units each morning.  Then continue to increase until the blood sugar comes down to the low-100's at different times of day check your blood sugar 2 times a day.  vary the time of day when you check, between before the 3 meals, and at bedtime.  also check if you have symptoms of your blood sugar being too high or too low.  please keep a record of the readings and bring it to your next appointment here.  please call us sooner if your blood sugar goes below 70, or if you have a lot of readings over 200.   Please come back for a follow-up appointment in 6 weeks.   Please increase the insulin to 100 units each morning.  Please call if the bruise on your toenail gets worse.

## 2012-09-09 NOTE — Progress Notes (Signed)
  Subjective:    Patient ID: Alexander [name], male    DOB: 07-01-1971, 42 y.o.   MRN: [id number]  HPI Pt returns for f/u of type 2 DM (dx'ed 123XX123; complicated by retinopathy; he has requested qd insulin).  pt states he feels well in general.  no cbg record, but states cbg's vary from 220-260.  Pt states 4 days of slight bruising of the right great toenail, but no assoc pain.  This started in the context of a minor injury  Past Medical History  Diagnosis Date  . DIABETES MELLITUS, TYPE II 03/20/2007  . HYPERCHOLESTEROLEMIA 07/21/2008  . HYPERTENSION 03/20/2007  . CHEST PAIN 08/28/2009    No past surgical history on file.  History   Social History  . Marital Status: Married    Spouse Name: N/A    Number of Children: N/A  . Years of Education: N/A   Occupational History  . [employer]   Social History Main Topics  . Smoking status: Never Smoker   . Smokeless tobacco: Not on file  . Alcohol Use: Not on file  . Drug Use: Not on file  . Sexually Active: Not on file   Other Topics Concern  . Not on file   Social History Narrative  . No narrative on file    Current Outpatient Prescriptions on File Prior to Visit  Medication Sig Dispense Refill  . atorvastatin (LIPITOR) 80 MG tablet Take 80 mg by mouth at bedtime.        . clomiPHENE (CLOMID) 50 MG tablet 1/4 tab daily  10 tablet  2  . Folic Acid 7.5 MG TABS Take 1 tablet (7.5 mg total) by mouth daily.  30 tablet  11  . glucose blood (FREESTYLE LITE) test strip Use as instructed two times a day       . Insulin Pen Needle 31G X 8 MM MISC Use as directed once daily dx 250.53  100 each  3  . rosuvastatin (CRESTOR) 10 MG tablet Take 1 tablet (10 mg total) by mouth at bedtime.  30 tablet  11   No current facility-administered medications on file prior to visit.    No Known Allergies  Family History  Problem Relation Age of Onset  . Diabetes Mother   . Diabetes Father     BP 128/72  Pulse 98  Wt 247 lb (112.038 kg)   BMI 30.87 kg/m2  SpO2 96%  Review of Systems denies hypoglycemia.  He has gained a few lbs.      Objective:   Physical Exam VITAL SIGNS:  See vs page GENERAL: no distress Pulses: dorsalis pedis intact bilat.   Feet: no deformity.  no ulcer on the feet.  feet are of normal color and temp.  no edema.  There is a 1 cm diameter ecchymosis at the right great toenail--otherwise normal.   Neuro: sensation is intact to touch on the feet     Assessment & Plan:  Ecchymosis, new DM: needs increased rx

## 2012-10-27 ENCOUNTER — Ambulatory Visit: Payer: BC Managed Care – PPO | Admitting: Endocrinology

## 2012-11-02 ENCOUNTER — Ambulatory Visit (INDEPENDENT_AMBULATORY_CARE_PROVIDER_SITE_OTHER): Payer: BC Managed Care – PPO | Admitting: Endocrinology

## 2012-11-02 VITALS — BP 126/74 | HR 76 | Wt 250.0 lb

## 2012-11-02 DIAGNOSIS — E291 Testicular hypofunction: Secondary | ICD-10-CM

## 2012-11-02 DIAGNOSIS — E1065 Type 1 diabetes mellitus with hyperglycemia: Secondary | ICD-10-CM

## 2012-11-02 DIAGNOSIS — E1039 Type 1 diabetes mellitus with other diabetic ophthalmic complication: Secondary | ICD-10-CM

## 2012-11-02 MED ORDER — CLOMIPHENE CITRATE 50 MG PO TABS: ORAL_TABLET | ORAL | Status: DC

## 2012-11-02 NOTE — Patient Instructions (Addendum)
check your blood sugar 2 times a day.  vary the time of day when you check, between before the 3 meals, and at bedtime.  also check if you have symptoms of your blood sugar being too high or too low.  please keep a record of the readings and bring it to your next appointment here.  please call us sooner if your blood sugar goes below 70, or if you have a lot of readings over 200.    Please come back for a follow-up appointment in 3 months.   On this type of insulin, it is important to eat meals on a regular schedule.

## 2012-11-02 NOTE — Progress Notes (Signed)
  Subjective:    Patient ID: Alexander [name], male    DOB: June 23, 1971, 42 y.o.   MRN: [id number]  HPI Pt returns for f/u of type 2 DM (dx'ed 123XX123; complicated by retinopathy; he has requested qd insulin).  pt states he feels well in general.  no cbg record, but states cbg's vary from 200-210.  He had 1 episode of cbg of 54, when he missed a meal.   He declines to increase the insulin today.   He has been out of clomid x 2 weeks ago Past Medical History  Diagnosis Date  . DIABETES MELLITUS, TYPE II 03/20/2007  . HYPERCHOLESTEROLEMIA 07/21/2008  . HYPERTENSION 03/20/2007  . CHEST PAIN 08/28/2009    No past surgical history on file.  History   Social History  . Marital Status: Married    Spouse Name: N/A    Number of Children: N/A  . Years of Education: N/A   Occupational History  . [employer]   Social History Main Topics  . Smoking status: Never Smoker   . Smokeless tobacco: Not on file  . Alcohol Use: Not on file  . Drug Use: Not on file  . Sexually Active: Not on file   Other Topics Concern  . Not on file   Social History Narrative  . No narrative on file    Current Outpatient Prescriptions on File Prior to Visit  Medication Sig Dispense Refill  . atorvastatin (LIPITOR) 80 MG tablet Take 80 mg by mouth at bedtime.        . Folic Acid 7.5 MG TABS Take 1 tablet (7.5 mg total) by mouth daily.  30 tablet  11  . glucose blood (FREESTYLE LITE) test strip Use as instructed two times a day       . insulin detemir (LEVEMIR FLEXPEN) 100 UNIT/ML injection Inject 100 Units into the skin every morning. And pen needles 1/day  45 mL  12  . Insulin Pen Needle 31G X 8 MM MISC Use as directed once daily dx 250.53  100 each  3  . rosuvastatin (CRESTOR) 10 MG tablet Take 1 tablet (10 mg total) by mouth at bedtime.  30 tablet  11   No current facility-administered medications on file prior to visit.    No Known Allergies  Family History  Problem Relation Age of Onset  . Diabetes  Mother   . Diabetes Father     BP 126/74  Pulse 76  Wt 250 lb (113.399 kg)  BMI 31.25 kg/m2  SpO2 98%    Review of Systems denies hypoglycemia    Objective:   Physical Exam VITAL SIGNS:  See vs page GENERAL: no distress  Pulses: dorsalis pedis intact bilat.   Feet: no deformity.  no ulcer on the feet.  feet are of normal color and temp.  no edema.  There is still a 1 cm diameter ecchymosis at the right great toenail--otherwise normal.   Neuro: sensation is intact to touch on the feet     Assessment & Plan:  DM: he prob needs increased rx Hypogonadism, therapy limited by noncompliance.  i'll do the best i can. Ecchymosis, unchanged

## 2013-03-22 ENCOUNTER — Encounter: Payer: Self-pay | Admitting: Endocrinology

## 2013-03-22 ENCOUNTER — Ambulatory Visit (INDEPENDENT_AMBULATORY_CARE_PROVIDER_SITE_OTHER): Payer: BC Managed Care – PPO | Admitting: Endocrinology

## 2013-03-22 VITALS — BP 126/80 | HR 78 | Ht 72.0 in | Wt 254.0 lb

## 2013-03-22 DIAGNOSIS — E1039 Type 1 diabetes mellitus with other diabetic ophthalmic complication: Secondary | ICD-10-CM

## 2013-03-22 DIAGNOSIS — E291 Testicular hypofunction: Secondary | ICD-10-CM

## 2013-03-22 NOTE — Progress Notes (Signed)
  Subjective:    Patient ID: Alexander [name], male    DOB: 11/16/1970, 42 y.o.   MRN: [id number]  HPI Pt returns for f/u of type 2 DM (dx'ed 123XX123; complicated by retinopathy; he has requested qd insulin).  pt states he feels well in general.  He was rx'ed with prednisone last week, for a back injury.  It then increased to the 300's, but is back to the 200's again.  Prior to the prednisone, cbg's were in the 200's.  He says he never misses his insulin.  He says cbg's can be in the 80's if a meal is delayed, but this seldom happens.   Past Medical History  Diagnosis Date  . DIABETES MELLITUS, TYPE II 03/20/2007  . HYPERCHOLESTEROLEMIA 07/21/2008  . HYPERTENSION 03/20/2007  . CHEST PAIN 08/28/2009    No past surgical history on file.  History   Social History  . Marital Status: Married    Spouse Name: N/A    Number of Children: N/A  . Years of Education: N/A   Occupational History  . [employer]   Social History Main Topics  . Smoking status: Never Smoker   . Smokeless tobacco: Not on file  . Alcohol Use: Not on file  . Drug Use: Not on file  . Sexual Activity: Not on file   Other Topics Concern  . Not on file   Social History Narrative  . No narrative on file    Current Outpatient Prescriptions on File Prior to Visit  Medication Sig Dispense Refill  . atorvastatin (LIPITOR) 80 MG tablet Take 80 mg by mouth at bedtime.        . clomiPHENE (CLOMID) 50 MG tablet 1/4 tab daily  10 tablet  11  . Folic Acid 7.5 MG TABS Take 1 tablet (7.5 mg total) by mouth daily.  30 tablet  11  . glucose blood (FREESTYLE LITE) test strip Use as instructed two times a day       . Insulin Pen Needle 31G X 8 MM MISC Use as directed once daily dx 250.53  100 each  3  . rosuvastatin (CRESTOR) 10 MG tablet Take 1 tablet (10 mg total) by mouth at bedtime.  30 tablet  11   No current facility-administered medications on file prior to visit.    No Known Allergies  Family History  Problem  Relation Age of Onset  . Diabetes Mother   . Diabetes Father     BP 126/80  Pulse 78  Ht 6' (1.829 m)  Wt 254 lb (115.214 kg)  BMI 34.44 kg/m2  SpO2 97%  Review of Systems denies hypoglycemia and weight change.      Objective:   Physical Exam VITAL SIGNS:  See vs page.   GENERAL: no distress.  Lab Results  Component Value Date   HGBA1C 10.3* 03/22/2013      Assessment & Plan:  DM: he needs increased rx.  This insulin regimen was chosen from multiple options, for its simplicity.  The benefits of glycemic control must be weighed against the risks of hypoglycemia.   Back pain: prednisone complicates the rx of DM, but he needs to take it. ED: this might improve with improved glycemic control.

## 2013-03-22 NOTE — Patient Instructions (Addendum)
check your blood sugar 2 times a day.  vary the time of day when you check, between before the 3 meals, and at bedtime.  also check if you have symptoms of your blood sugar being too high or too low.  please keep a record of the readings and bring it to your next appointment here.  please call us sooner if your blood sugar goes below 70, or if you have a lot of readings over 200.    Please come back for a follow-up appointment in 3 months.    On this type of insulin schedule, you should eat meals on a regular schedule.  If a meal is missed or significantly delayed, your blood sugar could go low.   blood and urine tests are being requested for you today.  We'll contact you with results.  Please call if you want medication for "ED" symptoms.

## 2013-03-23 LAB — BASIC METABOLIC PANEL
BUN: 19 mg/dL (ref 6–23)
CO2: 28 mEq/L (ref 19–32)
Chloride: 101 mEq/L (ref 96–112)
Creatinine, Ser: 1.2 mg/dL (ref 0.4–1.5)

## 2013-03-23 LAB — HEMOGLOBIN A1C: Hgb A1c MFr Bld: 10.3 % — ABNORMAL HIGH (ref 4.6–6.5)

## 2013-03-23 MED ORDER — INSULIN DETEMIR 100 UNIT/ML ~~LOC~~ SOLN: 125.0000 [IU] | SUBCUTANEOUS | Status: DC

## 2013-05-05 ENCOUNTER — Other Ambulatory Visit: Payer: Self-pay | Admitting: Endocrinology

## 2013-05-06 ENCOUNTER — Other Ambulatory Visit: Payer: Self-pay | Admitting: *Deleted

## 2013-05-06 DIAGNOSIS — E1039 Type 1 diabetes mellitus with other diabetic ophthalmic complication: Secondary | ICD-10-CM

## 2013-05-06 MED ORDER — INSULIN PEN NEEDLE 31G X 8 MM MISC: Status: DC

## 2013-05-13 ENCOUNTER — Other Ambulatory Visit: Payer: Self-pay

## 2013-08-04 ENCOUNTER — Telehealth: Payer: Self-pay

## 2013-08-04 NOTE — Telephone Encounter (Signed)
Pt informed. Pt stated that he has made an appointment on 09/01/2013.

## 2013-08-04 NOTE — Telephone Encounter (Signed)
Ov here is due Please ask dr Lenna Gilford about cough syrup, as he is a lung specialist

## 2013-08-04 NOTE — Telephone Encounter (Signed)
Pt called requesting that a script be sent into the pharmacy for diabetic cough medication. Pt states that he has been having trouble with a cough for a while.  Please advise, Thanks!

## 2013-08-06 ENCOUNTER — Ambulatory Visit (INDEPENDENT_AMBULATORY_CARE_PROVIDER_SITE_OTHER): Payer: BC Managed Care – PPO | Admitting: Internal Medicine

## 2013-08-06 ENCOUNTER — Ambulatory Visit (INDEPENDENT_AMBULATORY_CARE_PROVIDER_SITE_OTHER)
Admission: RE | Admit: 2013-08-06 | Discharge: 2013-08-06 | Disposition: A | Discharge status: Home / Self Care | Payer: BC Managed Care – PPO | Source: Ambulatory Visit | Attending: Internal Medicine | Admitting: Internal Medicine

## 2013-08-06 ENCOUNTER — Encounter: Payer: Self-pay | Admitting: Internal Medicine

## 2013-08-06 VITALS — BP 136/86 | HR 93 | Temp 98.1°F | Ht 75.0 in | Wt 251.0 lb

## 2013-08-06 DIAGNOSIS — R059 Cough, unspecified: Secondary | ICD-10-CM

## 2013-08-06 DIAGNOSIS — R05 Cough: Secondary | ICD-10-CM

## 2013-08-06 MED ORDER — TRAMADOL HCL 50 MG PO TABS: ORAL_TABLET | ORAL | Status: DC

## 2013-08-06 NOTE — Progress Notes (Signed)
   Subjective:    Patient ID: Alexander [name], male    DOB: 1970/08/23   MRN: [id number]  HPI  71 yobm never smoker with no resp problems self referred 08/06/2013 to pulmonary clinic for cough   08/06/2013 1st [facility] office visit/ EMR era Alexander [name]  Chief Complaint  Patient presents with  . Pulmonary Consult    Self referral. Pt c/o non prod cough on and off x 2 wks- esp worse at night. He has had fever for the past 2 days.    onset first sniffles, dry cough, nasal congestion rx with flonase from office nurse then fever 2 days prior to OV  With chills rx abx   from the office nurse but hasn't started it yet and doesn't know what it is.  No obvious day to day or daytime variabilty or assoc sob or cp or chest tightness, subjective wheeze overt   hb symptoms. No unusual exp hx or h/o childhood pna/ asthma or knowledge of premature birth.  Sleeping ok without nocturnal  or early am exacerbation  of respiratory  c/o's or need for noct saba. Also denies any obvious fluctuation of symptoms with weather or environmental changes or other aggravating or alleviating factors except as outlined above   Current Medications, Allergies, Complete Past Medical History, Past Surgical History, Family History, and Social History were reviewed in [employer] [facility] record.            Review of Systems  Constitutional: Positive for fever. Negative for chills, activity change, appetite change and unexpected weight change.  HENT: Positive for congestion and sneezing. Negative for dental problem, postnasal drip, rhinorrhea, sore throat, trouble swallowing and voice change.   Eyes: Negative for visual disturbance.  Respiratory: Positive for cough. Negative for choking and shortness of breath.   Cardiovascular: Negative for chest pain and leg swelling.  Gastrointestinal: Negative for nausea, vomiting and abdominal pain.  Genitourinary: Negative for difficulty urinating.  Musculoskeletal:  Negative for arthralgias.  Skin: Negative for rash.  Psychiatric/Behavioral: Negative for behavioral problems and confusion.       Objective:   Physical Exam   Wt Readings from Last 3 Encounters:  08/06/13 251 lb (113.853 kg)  03/22/13 254 lb (115.214 kg)  11/02/12 250 lb (113.399 kg)      HEENT: nl dentition, turbinates, and orophanx. Nl external ear canals without cough reflex   NECK :  without JVD/Nodes/TM/ nl carotid upstrokes bilaterally   LUNGS: no acc muscle use, clear to A and P bilaterally without cough on insp or exp maneuvers   CV:  RRR  no s3 or murmur or increase in P2, no edema   ABD:  soft and nontender with nl excursion in the supine position. No bruits or organomegaly, bowel sounds nl  MS:  warm without deformities, calf tenderness, cyanosis or clubbing  SKIN: warm and dry without lesions    NEURO:  alert, approp, no deficits    CXR  08/06/2013 :  No active cardiopulmonary disease.      Assessment & Plan:

## 2013-08-06 NOTE — Patient Instructions (Signed)
Take mucinex dm up to 1200 mg  every 12 hours and supplement if needed with  tramadol 50 mg up to 2 every 4 hours to suppress the urge to cough. Swallowing water or using ice chips/non mint and menthol containing candies (such as lifesavers or sugarless jolly ranchers) are also effective.  You should rest your voice and avoid activities that you know make you cough.  Once you have eliminated the cough for 3 straight days try reducing the tramadol first,  then the mucinex dm as tolerated.    Call us with the name of your antibiotic and if not better by first of next week (08/09/13)  Please remember to go to the x-ray department downstairs for your tests - we will call you with the results when they are available.  Pulmonary follow up is as needed

## 2013-08-07 NOTE — Assessment & Plan Note (Signed)
Acute onset with uri like symptoms typical for viral cause except for fever late in the course suggesting possible secondary bacterial infection. No evidence though pna so fine to go ahead and take the abx prescibed and let us know in 72 h if not improving  gen guidelines for treating uri's reviewed  See instructions for specific recommendations which were reviewed directly with the patient who was given a copy with highlighter outlining the key components.

## 2013-08-09 NOTE — Progress Notes (Signed)
Quick Note:  LMTCB ______ 

## 2013-09-01 ENCOUNTER — Encounter: Payer: Self-pay | Admitting: Endocrinology

## 2013-09-01 ENCOUNTER — Ambulatory Visit (INDEPENDENT_AMBULATORY_CARE_PROVIDER_SITE_OTHER): Payer: BC Managed Care – PPO | Admitting: Endocrinology

## 2013-09-01 VITALS — BP 132/98 | HR 92 | Temp 99.2°F | Ht 75.0 in | Wt 251.0 lb

## 2013-09-01 DIAGNOSIS — R209 Unspecified disturbances of skin sensation: Secondary | ICD-10-CM

## 2013-09-01 DIAGNOSIS — E1065 Type 1 diabetes mellitus with hyperglycemia: Principal | ICD-10-CM

## 2013-09-01 DIAGNOSIS — E291 Testicular hypofunction: Secondary | ICD-10-CM

## 2013-09-01 DIAGNOSIS — E1039 Type 1 diabetes mellitus with other diabetic ophthalmic complication: Secondary | ICD-10-CM

## 2013-09-01 DIAGNOSIS — R200 Anesthesia of skin: Secondary | ICD-10-CM

## 2013-09-01 LAB — TSH: TSH: 1.01 u[IU]/mL (ref 0.35–5.50)

## 2013-09-01 LAB — TESTOSTERONE: TESTOSTERONE: 246.45 ng/dL — AB (ref 350.00–890.00)

## 2013-09-01 LAB — VITAMIN B12: Vitamin B-12: 320 pg/mL (ref 211–911)

## 2013-09-01 LAB — HEMOGLOBIN A1C: HEMOGLOBIN A1C: 9.9 % — AB (ref 4.6–6.5)

## 2013-09-01 NOTE — Progress Notes (Signed)
   Subjective:    Patient ID: Alexander [name], male    DOB: [birth year], 43 y.o.   MRN: [id number]  HPI Pt returns for f/u of type 2 DM (dx'ed 2004; he has mild neuropathy of the lower extremities, and associated retinopathy; he has been on insulin since 2013; he has requested qd insulin; he has never had severe hypoglycemia or DKA).  no cbg record, but states cbg's are in the mid to high-100's.  It is in general higher as the day goes on.  He has slight numbness of the feet.   Past Medical History  Diagnosis Date  . DIABETES MELLITUS, TYPE II 03/20/2007  . HYPERCHOLESTEROLEMIA 07/21/2008  . HYPERTENSION 03/20/2007  . CHEST PAIN 08/28/2009    No past surgical history on file.  History   Social History  . Marital Status: Married    Spouse Name: N/A    Number of Children: N/A  . Years of Education: N/A   Occupational History  . [employer]   Social History Main Topics  . Smoking status: Never Smoker   . Smokeless tobacco: Never Used  . Alcohol Use: No  . Drug Use: No  . Sexual Activity: Not on file   Other Topics Concern  . Not on file   Social History Narrative  . No narrative on file    Current Outpatient Prescriptions on File Prior to Visit  Medication Sig Dispense Refill  . atorvastatin (LIPITOR) 80 MG tablet Take 80 mg by mouth at bedtime.        . Chlorphen-Pseudoephed-APAP (THERAFLU FLU/COLD PO) As directed as needed      . clomiPHENE (CLOMID) 50 MG tablet 1/4 tab daily  10 tablet  11  . Folic Acid 7.5 MG TABS Take 1 tablet (7.5 mg total) by mouth daily.  30 tablet  11  . glucose blood (FREESTYLE LITE) test strip Use as instructed two times a day       . Insulin Pen Needle 31G X 8 MM MISC Use as directed once daily dx 250.53  100 each  3  . traMADol (ULTRAM) 50 MG tablet 1-2 every 4 hours as needed for cough or pain  40 tablet  0   No current facility-administered medications on file prior to visit.    No Known Allergies  Family History  Problem Relation  Age of Onset  . Diabetes Mother   . Diabetes Father     BP 132/98  Pulse 92  Temp(Src) 99.2 F (37.3 C) (Oral)  Ht 6\' 3"  (1.905 m)  Wt 251 lb (113.853 kg)  BMI 31.37 kg/m2  SpO2 98%  Review of Systems Clomid helps ED sxs.  He denies hypoglycemia.    Objective:   Physical Exam VITAL SIGNS:  See vs page GENERAL: no distress  Lab Results  Component Value Date   HGBA1C 9.9* 09/01/2013   Lab Results  Component Value Date   TESTOSTERONE 246.45* 09/01/2013      Assessment & Plan:  DM: he needs increased rx.  This insulin regimen was chosen from multiple options, for its simplicity.  The benefits of glycemic control must be weighed against the risks of hypoglycemia.  Noncompliance with cbg recording and f/u appts.  This limits the rx of DM. Hypogonadism: he needs increased rx

## 2013-09-01 NOTE — Patient Instructions (Signed)
check your blood sugar 2 times a day.  vary the time of day when you check, between before the 3 meals, and at bedtime.  also check if you have symptoms of your blood sugar being too high or too low.  please keep a record of the readings and bring it to your next appointment here.  please call us sooner if your blood sugar goes below 70, or if you have a lot of readings over 200.    Please come back for a follow-up appointment in 3 months.    On this type of insulin schedule, you should eat meals on a regular schedule.  If a meal is missed or significantly delayed, your blood sugar could go low.   blood tests are being requested for you today.  We'll contact you with results.  Some specialists believe that taking a high amount of folic acid (4-5 mg daily), helps the neuropathy.

## 2013-11-30 ENCOUNTER — Ambulatory Visit: Payer: BC Managed Care – PPO | Admitting: Endocrinology

## 2013-11-30 DIAGNOSIS — Z0289 Encounter for other administrative examinations: Secondary | ICD-10-CM

## 2014-01-25 ENCOUNTER — Emergency Department (HOSPITAL_COMMUNITY)
Admission: EM | Admit: 2014-01-25 | Discharge: 2014-01-25 | Disposition: A | Discharge status: Home / Self Care | Payer: BC Managed Care – PPO | Attending: Emergency Medicine | Admitting: Emergency Medicine

## 2014-01-25 ENCOUNTER — Encounter (HOSPITAL_COMMUNITY): Payer: Self-pay | Admitting: Emergency Medicine

## 2014-01-25 ENCOUNTER — Emergency Department (HOSPITAL_COMMUNITY): Payer: BC Managed Care – PPO

## 2014-01-25 DIAGNOSIS — Y92410 Unspecified street and highway as the place of occurrence of the external cause: Secondary | ICD-10-CM | POA: Insufficient documentation

## 2014-01-25 DIAGNOSIS — S298XXA Other specified injuries of thorax, initial encounter: Secondary | ICD-10-CM | POA: Insufficient documentation

## 2014-01-25 DIAGNOSIS — S6390XA Sprain of unspecified part of unspecified wrist and hand, initial encounter: Secondary | ICD-10-CM | POA: Insufficient documentation

## 2014-01-25 DIAGNOSIS — S63601A Unspecified sprain of right thumb, initial encounter: Secondary | ICD-10-CM

## 2014-01-25 DIAGNOSIS — Z794 Long term (current) use of insulin: Secondary | ICD-10-CM | POA: Insufficient documentation

## 2014-01-25 DIAGNOSIS — E119 Type 2 diabetes mellitus without complications: Secondary | ICD-10-CM | POA: Insufficient documentation

## 2014-01-25 DIAGNOSIS — Y9389 Activity, other specified: Secondary | ICD-10-CM | POA: Insufficient documentation

## 2014-01-25 DIAGNOSIS — I10 Essential (primary) hypertension: Secondary | ICD-10-CM | POA: Insufficient documentation

## 2014-01-25 DIAGNOSIS — E7800 Pure hypercholesterolemia, unspecified: Secondary | ICD-10-CM | POA: Insufficient documentation

## 2014-01-25 DIAGNOSIS — S63509A Unspecified sprain of unspecified wrist, initial encounter: Secondary | ICD-10-CM | POA: Insufficient documentation

## 2014-01-25 DIAGNOSIS — R0789 Other chest pain: Secondary | ICD-10-CM

## 2014-01-25 DIAGNOSIS — S63501A Unspecified sprain of right wrist, initial encounter: Secondary | ICD-10-CM

## 2014-01-25 MED ORDER — IBUPROFEN 600 MG PO TABS: 600.0000 mg | ORAL_TABLET | Freq: Four times a day (QID) | ORAL | Status: DC | PRN

## 2014-01-25 MED ORDER — CYCLOBENZAPRINE HCL 10 MG PO TABS: 10.0000 mg | ORAL_TABLET | Freq: Two times a day (BID) | ORAL | Status: DC | PRN

## 2014-01-25 NOTE — ED Notes (Signed)
Discharge instructions reviewed with pt. Pt verbalized understanding.   

## 2014-01-25 NOTE — ED Notes (Signed)
PA at bedside.
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
## 2014-01-25 NOTE — ED Notes (Signed)
Pt reports pain is radiating up arm to the neck.

## 2014-01-26 NOTE — ED Provider Notes (Signed)
Medical screening examination/treatment/procedure(s) were performed by non-physician practitioner and as supervising physician I was immediately available for consultation/collaboration.   EKG Interpretation None        Elyn Peers, MD 01/26/14 [phone]

## 2014-02-02 ENCOUNTER — Telehealth: Payer: Self-pay

## 2014-02-02 NOTE — Telephone Encounter (Signed)
Diabetic Bundle. Lvom to call back and schedule appointment with Dr. Loanne Drilling

## 2014-04-01 ENCOUNTER — Ambulatory Visit (INDEPENDENT_AMBULATORY_CARE_PROVIDER_SITE_OTHER): Payer: BC Managed Care – PPO | Admitting: Endocrinology

## 2014-04-01 ENCOUNTER — Encounter: Payer: Self-pay | Admitting: Endocrinology

## 2014-04-01 VITALS — BP 132/92 | HR 90 | Temp 98.4°F | Wt 264.0 lb

## 2014-04-01 DIAGNOSIS — E1065 Type 1 diabetes mellitus with hyperglycemia: Principal | ICD-10-CM

## 2014-04-01 DIAGNOSIS — E1039 Type 1 diabetes mellitus with other diabetic ophthalmic complication: Secondary | ICD-10-CM

## 2014-04-01 NOTE — Progress Notes (Signed)
Subjective:    Patient ID: Alexander [name], male    DOB: Aug 11, 1970, 43 y.o.   MRN: [id number]  HPI Pt returns for f/u of diabetes mellitus:  DM type: insulin-requiring type 2 Dx'ed: 123XX123 Complications: sensory neuropathy and retinopathy Therapy: insulin since 2013 DKA: never Severe hypoglycemia: never Pancreatitis: never Other info: he has requested qd insulin  Interval history: no cbg record, but states cbg's vary from 149-254.  There is no trend throughout the day.  pt states he feels well in general. Past Medical History  Diagnosis Date  . DIABETES MELLITUS, TYPE II 03/20/2007  . HYPERCHOLESTEROLEMIA 07/21/2008  . HYPERTENSION 03/20/2007  . CHEST PAIN 08/28/2009    No past surgical history on file.  History   Social History  . Marital Status: Married    Spouse Name: N/A    Number of Children: N/A  . Years of Education: N/A   Occupational History  . [employer]   Social History Main Topics  . Smoking status: Never Smoker   . Smokeless tobacco: Never Used  . Alcohol Use: No  . Drug Use: No  . Sexual Activity: Not on file   Other Topics Concern  . Not on file   Social History Narrative  . No narrative on file    Current Outpatient Prescriptions on File Prior to Visit  Medication Sig Dispense Refill  . cyclobenzaprine (FLEXERIL) 10 MG tablet Take 1 tablet (10 mg total) by mouth 2 (two) times daily as needed for muscle spasms.  20 tablet  0  . Folic Acid 7.5 MG TABS Take 1 tablet (7.5 mg total) by mouth daily.  30 tablet  11  . glucose blood (FREESTYLE LITE) test strip Use as instructed two times a day       . ibuprofen (ADVIL,MOTRIN) 600 MG tablet Take 1 tablet (600 mg total) by mouth every 6 (six) hours as needed.  30 tablet  0  . insulin detemir (LEVEMIR) 100 UNIT/ML injection Inject 150 Units into the skin every morning. And pen needles 1/day       No current facility-administered medications on file prior to visit.    No Known Allergies  Family  History  Problem Relation Age of Onset  . Diabetes Mother   . Diabetes Father     BP 132/92  Pulse 90  Temp(Src) 98.4 F (36.9 C) (Oral)  Wt 264 lb (119.75 kg)  SpO2 95%    Review of Systems He denies hypoglycemia.  He has gained a few lbs.      Objective:   Physical Exam VITAL SIGNS:  See vs page GENERAL: no distress Pulses: dorsalis pedis intact bilat.   Feet: no deformity.  no edema Skin:  no ulcer on the feet.  normal color and temp. Neuro: sensation is intact to touch on the feet      Assessment & Plan:  DM: moderate exacerbation HTN: new to me, with ? Of situational component.  We'll recheck this in the future.   Weight gain: he is encouraged to re-lose.     Patient is advised the following: Patient Instructions  check your blood sugar 2 times a day.  vary the time of day when you check, between before the 3 meals, and at bedtime.  also check if you have symptoms of your blood sugar being too high or too low.  please keep a record of the readings and bring it to your next appointment here.  please call us sooner if your  blood sugar goes below 70, or if you have a lot of readings over 200.    Please come back for a follow-up appointment in January.   On this type of insulin schedule, you should eat meals on a regular schedule.  If a meal is missed or significantly delayed, your blood sugar could go low.   blood tests are being requested for you today.  We'll contact you with results.

## 2014-04-01 NOTE — Patient Instructions (Addendum)
check your blood sugar 2 times a day.  vary the time of day when you check, between before the 3 meals, and at bedtime.  also check if you have symptoms of your blood sugar being too high or too low.  please keep a record of the readings and bring it to your next appointment here.  please call us sooner if your blood sugar goes below 70, or if you have a lot of readings over 200.    Please come back for a follow-up appointment in January.   On this type of insulin schedule, you should eat meals on a regular schedule.  If a meal is missed or significantly delayed, your blood sugar could go low.   blood tests are being requested for you today.  We'll contact you with results.

## 2014-04-16 ENCOUNTER — Other Ambulatory Visit: Payer: Self-pay | Admitting: Endocrinology

## 2014-07-05 ENCOUNTER — Other Ambulatory Visit: Payer: Self-pay | Admitting: Endocrinology

## 2014-07-15 ENCOUNTER — Encounter: Payer: Self-pay | Admitting: Endocrinology

## 2014-07-15 ENCOUNTER — Ambulatory Visit (INDEPENDENT_AMBULATORY_CARE_PROVIDER_SITE_OTHER): Payer: BLUE CROSS/BLUE SHIELD | Admitting: Endocrinology

## 2014-07-15 VITALS — BP 130/82 | HR 95 | Temp 98.4°F | Ht 75.0 in | Wt 260.0 lb

## 2014-07-15 DIAGNOSIS — R200 Anesthesia of skin: Secondary | ICD-10-CM

## 2014-07-15 DIAGNOSIS — E1065 Type 1 diabetes mellitus with hyperglycemia: Principal | ICD-10-CM

## 2014-07-15 DIAGNOSIS — IMO0002 Reserved for concepts with insufficient information to code with codable children: Secondary | ICD-10-CM

## 2014-07-15 DIAGNOSIS — E1039 Type 1 diabetes mellitus with other diabetic ophthalmic complication: Secondary | ICD-10-CM

## 2014-07-15 MED ORDER — TADALAFIL 20 MG PO TABS: 20.0000 mg | ORAL_TABLET | Freq: Every day | ORAL | Status: DC | PRN

## 2014-07-15 NOTE — Progress Notes (Signed)
Subjective:    Patient ID: Alexander [name], male    DOB: 06-Feb-1971, 44 y.o.   MRN: [id number]  HPI Pt returns for f/u of diabetes mellitus: DM type: insulin-requiring type 2 Dx'ed: 123XX123 Complications: sensory neuropathy and retinopathy Therapy: insulin since 2013.   DKA: never Severe hypoglycemia: never Pancreatitis: never Other: he has requested qd insulin.  He declines weight loss surgery.   Interval history: no cbg record, but states cbg's vary from 190-210.  There is no trend throughout the day.  pt states he feels well in general.  He says he never misses the insulin.   Past Medical History  Diagnosis Date  . DIABETES MELLITUS, TYPE II 03/20/2007  . HYPERCHOLESTEROLEMIA 07/21/2008  . HYPERTENSION 03/20/2007  . CHEST PAIN 08/28/2009    No past surgical history on file.  History   Social History  . Marital Status: Married    Spouse Name: N/A    Number of Children: N/A  . Years of Education: N/A   Occupational History  . [employer]   Social History Main Topics  . Smoking status: Never Smoker   . Smokeless tobacco: Never Used  . Alcohol Use: No  . Drug Use: No  . Sexual Activity: Not on file   Other Topics Concern  . Not on file   Social History Narrative    Current Outpatient Prescriptions on File Prior to Visit  Medication Sig Dispense Refill  . cyclobenzaprine (FLEXERIL) 10 MG tablet Take 1 tablet (10 mg total) by mouth 2 (two) times daily as needed for muscle spasms. 20 tablet 0  . Folic Acid 7.5 MG TABS Take 1 tablet (7.5 mg total) by mouth daily. 30 tablet 11  . glucose blood (FREESTYLE LITE) test strip Use as instructed two times a day     . ibuprofen (ADVIL,MOTRIN) 600 MG tablet Take 1 tablet (600 mg total) by mouth every 6 (six) hours as needed. 30 tablet 0  . insulin detemir (LEVEMIR) 100 UNIT/ML injection Inject 150 Units into the skin every morning. And pen needles 1/day     No current facility-administered medications on file prior to  visit.    No Known Allergies  Family History  Problem Relation Age of Onset  . Diabetes Mother   . Diabetes Father     BP 130/82 mmHg  Pulse 95  Temp(Src) 98.4 F (36.9 C) (Oral)  Ht 6\' 3"  (1.905 m)  Wt 260 lb (117.935 kg)  BMI 32.50 kg/m2  SpO2 98%  Review of Systems He denies hypoglycemia.  He has gained weight.      Objective:   Physical Exam VITAL SIGNS:  See vs page GENERAL: no distress Pulses: dorsalis pedis intact bilat.   MSK: no deformity of the feet CV: no leg edema Skin:  no ulcer on the feet.  normal color and temp on the feet. Neuro: sensation is intact to touch on the feet, but decreased from normal Ext: There is bilateral onychomycosis of the toenails.    Lab Results  Component Value Date   TESTOSTERONE 246.45* 09/01/2013       Assessment & Plan:  Polyneuropathy, mild exacerbation.  DM: moderate exacerbation.  ED: persistent.    Patient is advised the following: Patient Instructions  check your blood sugar 2 times a day.  vary the time of day when you check, between before the 3 meals, and at bedtime.  also check if you have symptoms of your blood sugar being too high or too low.  please keep a record of the readings and bring it to your next appointment here.  please call us sooner if your blood sugar goes below 70, or if you have a lot of readings over 200.   Please come back for a follow-up appointment in 3 months.   On this type of insulin schedule, you should eat meals on a regular schedule.  If a meal is missed or significantly delayed, your blood sugar could go low.   blood tests are being requested for you today.  We'll contact you with results.    Based on the results, we will probably need to increase the insulin, and i'll send a new prescription to your pharmacy. i have sent a prescription to your pharmacy, for cialis.  Some specialists say that taking a high amount of folic acid helps the neuropathy.

## 2014-07-15 NOTE — Patient Instructions (Addendum)
check your blood sugar 2 times a day.  vary the time of day when you check, between before the 3 meals, and at bedtime.  also check if you have symptoms of your blood sugar being too high or too low.  please keep a record of the readings and bring it to your next appointment here.  please call us sooner if your blood sugar goes below 70, or if you have a lot of readings over 200.   Please come back for a follow-up appointment in 3 months.   On this type of insulin schedule, you should eat meals on a regular schedule.  If a meal is missed or significantly delayed, your blood sugar could go low.   blood tests are being requested for you today.  We'll contact you with results.    Based on the results, we will probably need to increase the insulin, and i'll send a new prescription to your pharmacy. i have sent a prescription to your pharmacy, for cialis.  Some specialists say that taking a high amount of folic acid helps the neuropathy.

## 2014-07-18 ENCOUNTER — Other Ambulatory Visit: Payer: Self-pay | Admitting: Endocrinology

## 2014-07-18 LAB — LIPID PANEL
Cholesterol: 255 mg/dL — ABNORMAL HIGH (ref 0–200)
HDL: 32.1 mg/dL — ABNORMAL LOW (ref >39.00)
NonHDL: 222.9
TRIGLYCERIDES: 301 mg/dL — AB (ref 0.0–149.0)
Total CHOL/HDL Ratio: 8
VLDL: 60.2 mg/dL — ABNORMAL HIGH (ref 0.0–40.0)

## 2014-07-18 LAB — BASIC METABOLIC PANEL
BUN: 19 mg/dL (ref 6–23)
CO2: 27 mEq/L (ref 19–32)
CREATININE: 1.5 mg/dL (ref 0.4–1.5)
Calcium: 9.1 mg/dL (ref 8.4–10.5)
Chloride: 102 mEq/L (ref 96–112)
GFR: 65.81 mL/min (ref >60.00)
Glucose, Bld: 200 mg/dL — ABNORMAL HIGH (ref 70–99)
Potassium: 3.8 mEq/L (ref 3.5–5.1)
Sodium: 136 mEq/L (ref 135–145)

## 2014-07-18 LAB — HEMOGLOBIN A1C: Hgb A1c MFr Bld: 9.8 % — ABNORMAL HIGH (ref 4.6–6.5)

## 2014-07-18 LAB — VITAMIN B12: Vitamin B-12: 236 pg/mL (ref 211–911)

## 2014-07-18 LAB — LDL CHOLESTEROL, DIRECT: Direct LDL: 164 mg/dL

## 2014-07-18 LAB — TSH: TSH: 0.93 u[IU]/mL (ref 0.35–4.50)

## 2014-07-18 MED ORDER — INSULIN DETEMIR 100 UNIT/ML ~~LOC~~ SOLN: 180.0000 [IU] | SUBCUTANEOUS | Status: DC

## 2014-09-16 ENCOUNTER — Telehealth: Payer: Self-pay | Admitting: Endocrinology

## 2014-09-16 MED ORDER — GLUCOSE BLOOD VI STRP: ORAL_STRIP | Status: DC

## 2014-09-16 NOTE — Telephone Encounter (Signed)
Rx sent 

## 2014-09-16 NOTE — Telephone Encounter (Signed)
Patient would like to have his test strips refilled   Rx: FreeStyle Lite test strips   [facility]    Thank You

## 2014-10-01 ENCOUNTER — Other Ambulatory Visit: Payer: Self-pay | Admitting: Endocrinology

## 2014-10-14 ENCOUNTER — Ambulatory Visit: Payer: BC Managed Care – PPO | Admitting: Endocrinology

## 2014-10-28 ENCOUNTER — Ambulatory Visit: Payer: BLUE CROSS/BLUE SHIELD | Admitting: Endocrinology

## 2014-10-28 ENCOUNTER — Encounter: Payer: Self-pay | Admitting: Endocrinology

## 2014-10-28 ENCOUNTER — Ambulatory Visit (INDEPENDENT_AMBULATORY_CARE_PROVIDER_SITE_OTHER): Payer: BLUE CROSS/BLUE SHIELD | Admitting: Endocrinology

## 2014-10-28 VITALS — BP 132/90 | HR 89 | Temp 98.3°F | Ht 75.0 in | Wt 265.0 lb

## 2014-10-28 DIAGNOSIS — E1039 Type 1 diabetes mellitus with other diabetic ophthalmic complication: Secondary | ICD-10-CM

## 2014-10-28 DIAGNOSIS — E1065 Type 1 diabetes mellitus with hyperglycemia: Principal | ICD-10-CM

## 2014-10-28 DIAGNOSIS — IMO0002 Reserved for concepts with insufficient information to code with codable children: Secondary | ICD-10-CM

## 2014-10-28 LAB — HEMOGLOBIN A1C: Hgb A1c MFr Bld: 7.7 % — ABNORMAL HIGH (ref 4.6–6.5)

## 2014-10-28 LAB — MICROALBUMIN / CREATININE URINE RATIO
Creatinine,U: 116.3 mg/dL
Microalb Creat Ratio: 10.6 mg/g (ref 0.0–30.0)
Microalb, Ur: 12.3 mg/dL — ABNORMAL HIGH (ref 0.0–1.9)

## 2014-10-28 MED ORDER — INSULIN DETEMIR 100 UNIT/ML ~~LOC~~ SOLN: 200.0000 [IU] | SUBCUTANEOUS | Status: DC

## 2014-10-28 NOTE — Patient Instructions (Addendum)
check your blood sugar 2 times a day.  vary the time of day when you check, between before the 3 meals, and at bedtime.  also check if you have symptoms of your blood sugar being too high or too low.  please keep a record of the readings and bring it to your next appointment here.  please call us sooner if your blood sugar goes below 70, or if you have a lot of readings over 200.   Please come back for a follow-up appointment in 3 months.   On this type of insulin schedule, you should eat meals on a regular schedule.  If a meal is missed or significantly delayed, your blood sugar could go low.   blood and urine tests are being requested for you today.  We'll contact you with results.    Based on the results, we will probably need to increase the insulin again, and i'll send a new prescription to your pharmacy.

## 2014-10-28 NOTE — Progress Notes (Signed)
Subjective:    Patient ID: Alexander [name], male    DOB: March 02, 1971, 44 y.o.   MRN: [id number]  HPI Pt returns for f/u of diabetes mellitus: DM type: insulin-requiring type 2 Dx'ed: 123XX123 Complications: sensory neuropathy and retinopathy.   Therapy: insulin since 2013.   DKA: never Severe hypoglycemia: never Pancreatitis: never Other: he has requested qd insulin.  He declines weight loss surgery.   Interval history: no cbg record, but states cbg's vary from 162-220.  It is in general higher as the day goes on.  pt states he feels well in general.  He says he never misses the insulin.   Past Medical History  Diagnosis Date  . DIABETES MELLITUS, TYPE II 03/20/2007  . HYPERCHOLESTEROLEMIA 07/21/2008  . HYPERTENSION 03/20/2007  . CHEST PAIN 08/28/2009    No past surgical history on file.  History   Social History  . Marital Status: Married    Spouse Name: N/A  . Number of Children: N/A  . Years of Education: N/A   Occupational History  . [employer]   Social History Main Topics  . Smoking status: Never Smoker   . Smokeless tobacco: Never Used  . Alcohol Use: No  . Drug Use: No  . Sexual Activity: Not on file   Other Topics Concern  . Not on file   Social History Narrative    Current Outpatient Prescriptions on File Prior to Visit  Medication Sig Dispense Refill  . cyclobenzaprine (FLEXERIL) 10 MG tablet Take 1 tablet (10 mg total) by mouth 2 (two) times daily as needed for muscle spasms. 20 tablet 0  . Folic Acid 7.5 MG TABS Take 1 tablet (7.5 mg total) by mouth daily. 30 tablet 11  . glucose blood (FREESTYLE LITE) test strip two times a day  Dx E11.9 100 each 0  . ibuprofen (ADVIL,MOTRIN) 600 MG tablet Take 1 tablet (600 mg total) by mouth every 6 (six) hours as needed. 30 tablet 0  . tadalafil (CIALIS) 20 MG tablet Take 1 tablet (20 mg total) by mouth daily as needed for erectile dysfunction. 10 tablet 11   No current facility-administered medications on file  prior to visit.    No Known Allergies  Family History  Problem Relation Age of Onset  . Diabetes Mother   . Diabetes Father     BP 132/90 mmHg  Pulse 89  Temp(Src) 98.3 F (36.8 C) (Oral)  Ht 6\' 3"  (1.905 m)  Wt 265 lb (120.203 kg)  BMI 33.12 kg/m2  SpO2 97%   Review of Systems He denies hypoglycemia and weight change    Objective:   Physical Exam VITAL SIGNS:  See vs page GENERAL: no distress. Pulses: dorsalis pedis intact bilat.   MSK: no deformity of the feet.  CV: no leg edema.  Skin:  no ulcer on the feet.  normal color and temp on the feet. Neuro: sensation is intact to touch on the feet.   Ext: There is bilateral onychomycosis of the toenails   Lab Results  Component Value Date   HGBA1C 7.7* 10/28/2014      Assessment & Plan:  DM: he needs slightly increased rx HTN: worse, but no rx needed for now, as this may be situational. Noncompliance with cbg recording, persistent: I'll work around this as best I can.  Given this and qd insulin, he is not a candidate for a1c < 7  Patient is advised the following: Patient Instructions  check your blood sugar 2 times  a day.  vary the time of day when you check, between before the 3 meals, and at bedtime.  also check if you have symptoms of your blood sugar being too high or too low.  please keep a record of the readings and bring it to your next appointment here.  please call us sooner if your blood sugar goes below 70, or if you have a lot of readings over 200.   Please come back for a follow-up appointment in 3 months.   On this type of insulin schedule, you should eat meals on a regular schedule.  If a meal is missed or significantly delayed, your blood sugar could go low.   blood and urine tests are being requested for you today.  We'll contact you with results.    Based on the results, we will probably need to increase the insulin again, and i'll send a new prescription to your pharmacy.

## 2014-12-01 ENCOUNTER — Telehealth: Payer: Self-pay

## 2014-12-01 ENCOUNTER — Ambulatory Visit (INDEPENDENT_AMBULATORY_CARE_PROVIDER_SITE_OTHER): Payer: BLUE CROSS/BLUE SHIELD | Admitting: Emergency Medicine

## 2014-12-01 VITALS — BP 142/78 | HR 88 | Temp 98.6°F | Resp 17 | Ht 74.0 in | Wt 268.8 lb

## 2014-12-01 DIAGNOSIS — R42 Dizziness and giddiness: Secondary | ICD-10-CM

## 2014-12-01 DIAGNOSIS — IMO0002 Reserved for concepts with insufficient information to code with codable children: Secondary | ICD-10-CM

## 2014-12-01 DIAGNOSIS — E1139 Type 2 diabetes mellitus with other diabetic ophthalmic complication: Secondary | ICD-10-CM | POA: Diagnosis not present

## 2014-12-01 DIAGNOSIS — E1165 Type 2 diabetes mellitus with hyperglycemia: Principal | ICD-10-CM

## 2014-12-01 LAB — POCT CBC
Granulocyte percent: 62.2 %G (ref 37–80)
HCT, POC: 39.7 % — AB (ref 43.5–53.7)
HEMOGLOBIN: 12.5 g/dL — AB (ref 14.1–18.1)
LYMPH, POC: 3.5 — AB (ref 0.6–3.4)
MCH, POC: 25.1 pg — AB (ref 27–31.2)
MCHC: 31.4 g/dL — AB (ref 31.8–35.4)
MCV: 80.1 fL (ref 80–97)
MID (cbc): 0.3 (ref 0–0.9)
MPV: 7.3 fL (ref 0–99.8)
POC Granulocyte: 6.3 (ref 2–6.9)
POC LYMPH PERCENT: 34.6 %L (ref 10–50)
POC MID %: 3.2 % (ref 0–12)
Platelet Count, POC: 309 10*3/uL (ref 142–424)
RBC: 4.96 M/uL (ref 4.69–6.13)
RDW, POC: 15.3 %
WBC: 10.1 10*3/uL (ref 4.6–10.2)

## 2014-12-01 LAB — COMPREHENSIVE METABOLIC PANEL
ALT: 24 U/L (ref 0–53)
AST: 27 U/L (ref 0–37)
Albumin: 4.2 g/dL (ref 3.5–5.2)
Alkaline Phosphatase: 65 U/L (ref 39–117)
BILIRUBIN TOTAL: 0.4 mg/dL (ref 0.2–1.2)
BUN: 26 mg/dL — ABNORMAL HIGH (ref 6–23)
CHLORIDE: 100 meq/L (ref 96–112)
CO2: 28 mEq/L (ref 19–32)
Calcium: 9.9 mg/dL (ref 8.4–10.5)
Creat: 1.61 mg/dL — ABNORMAL HIGH (ref 0.50–1.35)
Glucose, Bld: 148 mg/dL — ABNORMAL HIGH (ref 70–99)
Potassium: 4.4 mEq/L (ref 3.5–5.3)
Sodium: 138 mEq/L (ref 135–145)
Total Protein: 7.8 g/dL (ref 6.0–8.3)

## 2014-12-01 LAB — GLUCOSE, POCT (MANUAL RESULT ENTRY): POC GLUCOSE: 132 mg/dL — AB (ref 70–99)

## 2014-12-01 LAB — POCT GLYCOSYLATED HEMOGLOBIN (HGB A1C): Hemoglobin A1C: 7.5

## 2014-12-01 NOTE — Progress Notes (Signed)
Subjective:  Patient ID: Alexander [name], male    DOB: 27-Oct-1970  Age: 44 y.o. MRN: [id number]  CC: Migraine; Dizziness; and Hypertension   HPI [name] presents for evaluation of a dizzy spell today. He said last night he had similar symptoms of weakness in his legs and a sense that his legs were rubbery. He has poorly controlled insulin-dependent diabetes. He went today to a Clinical research associate at his place of employment she checked his blood sugars and was 147. He has had no nausea vomiting. No fever or chills. No diarrhea. No cough. No coryza. Frontal pressure. He is concerned that he may have a sinus infection.  He apparently does not check his sugar daily or even multiple times a day despite being on insulin. Denies any fever or chills  He has no neurologic no other neurological or visual symptoms. He has no double vision blurred vision and difficulty with gait balance or coordination. Has no motor weakness.  He denies any improvement with over-the-counter medication home medications.  History Alexander [name] has a past medical history of DIABETES MELLITUS, TYPE II (03/20/2007); HYPERCHOLESTEROLEMIA (07/21/2008); HYPERTENSION (03/20/2007); and CHEST PAIN (08/28/2009).   He has no past surgical history on file.   His  family history includes Diabetes in his brother, father, maternal grandfather, mother, and paternal grandfather; Hypertension in his brother and father.  He   reports that he has never smoked. He has never used smokeless tobacco. He reports that he does not drink alcohol or use illicit drugs.  Outpatient Prescriptions Prior to Visit  Medication Sig Dispense Refill  . insulin detemir (LEVEMIR) 100 UNIT/ML injection Inject 2 mLs (200 Units total) into the skin every morning. 70 mL 11  . cyclobenzaprine (FLEXERIL) 10 MG tablet Take 1 tablet (10 mg total) by mouth 2 (two) times daily as needed for muscle spasms. (Patient not taking: Reported on 12/01/2014) 20 tablet 0  .  Folic Acid 7.5 MG TABS Take 1 tablet (7.5 mg total) by mouth daily. (Patient not taking: Reported on 12/01/2014) 30 tablet 11  . glucose blood (FREESTYLE LITE) test strip two times a day  Dx E11.9 (Patient not taking: Reported on 12/01/2014) 100 each 0  . ibuprofen (ADVIL,MOTRIN) 600 MG tablet Take 1 tablet (600 mg total) by mouth every 6 (six) hours as needed. (Patient not taking: Reported on 12/01/2014) 30 tablet 0  . tadalafil (CIALIS) 20 MG tablet Take 1 tablet (20 mg total) by mouth daily as needed for erectile dysfunction. (Patient not taking: Reported on 12/01/2014) 10 tablet 11   No facility-administered medications prior to visit.    History   Social History  . Marital Status: Married    Spouse Name: N/A  . Number of Children: N/A  . Years of Education: N/A   Occupational History  . [employer]   Social History Main Topics  . Smoking status: Never Smoker   . Smokeless tobacco: Never Used  . Alcohol Use: No  . Drug Use: No  . Sexual Activity: Not on file   Other Topics Concern  . None   Social History Narrative     Review of Systems  Constitutional: Positive for fatigue. Negative for fever, chills and appetite change.  HENT: Positive for sinus pressure. Negative for congestion, ear pain, postnasal drip and sore throat.   Eyes: Negative for pain and redness.  Respiratory: Negative for cough, shortness of breath and wheezing.   Cardiovascular: Negative for leg swelling.  Gastrointestinal: Negative for nausea, vomiting,  abdominal pain, diarrhea, constipation and blood in stool.  Endocrine: Negative for polyuria.  Genitourinary: Negative for dysuria, urgency, frequency and flank pain.  Musculoskeletal: Negative for gait problem.  Skin: Negative for rash.  Neurological: Positive for dizziness. Negative for weakness and headaches.  Psychiatric/Behavioral: Negative for confusion and decreased concentration. The patient is not nervous/anxious.     Objective:  BP  142/78 mmHg  Pulse 88  Temp(Src) 98.6 F (37 C) (Oral)  Resp 17  Ht 6\' 2"  (1.88 m)  Wt 268 lb 12.8 oz (121.927 kg)  BMI 34.50 kg/m2  SpO2 98%  Physical Exam  Constitutional: He is oriented to person, place, and time. He appears well-developed and well-nourished. No distress.  HENT:  Head: Normocephalic and atraumatic.  Right Ear: External ear normal.  Left Ear: External ear normal.  Nose: Nose normal.  Eyes: Conjunctivae and EOM are normal. Pupils are equal, round, and reactive to light. No scleral icterus.  Neck: Normal range of motion. Neck supple. No tracheal deviation present.  Cardiovascular: Normal rate, regular rhythm and normal heart sounds.   Pulmonary/Chest: Effort normal. No respiratory distress. He has no wheezes. He has no rales.  Abdominal: He exhibits no mass. There is no tenderness. There is no rebound and no guarding.  Musculoskeletal: He exhibits no edema.  Lymphadenopathy:    He has no cervical adenopathy.  Neurological: He is alert and oriented to person, place, and time. He has normal strength. No cranial nerve deficit. He displays a negative Romberg sign. Coordination and gait normal.  Skin: Skin is warm and dry. No rash noted.  Psychiatric: He has a normal mood and affect. His behavior is normal.      Assessment & Plan:   Avien was seen today for migraine, dizziness and hypertension.  Diagnoses and all orders for this visit:  Uncontrolled diabetes mellitus with eye complications Orders: -     POCT glucose (manual entry) -     POCT glycosylated hemoglobin (Hb A1C) -     Comprehensive metabolic panel  Dizziness Orders: -     POCT glucose (manual entry) -     POCT glycosylated hemoglobin (Hb A1C) -     Comprehensive metabolic panel -     POCT CBC   I am having Mr. Shisler maintain his Folic Acid, ibuprofen, cyclobenzaprine, tadalafil, glucose blood, and insulin detemir.  No orders of the defined types were placed in this encounter.      Follow-up: No Follow-up on file.  Roselee Culver, MD   Results for orders placed or performed in visit on 12/01/14  POCT glucose (manual entry)  Result Value Ref Range   POC Glucose 132 (A) 70 - 99 mg/dl  POCT glycosylated hemoglobin (Hb A1C)  Result Value Ref Range   Hemoglobin A1C 7.5   POCT CBC  Result Value Ref Range   WBC 10.1 4.6 - 10.2 K/uL   Lymph, poc 3.5 (A) 0.6 - 3.4   POC LYMPH PERCENT 34.6 10 - 50 %L   MID (cbc) 0.3 0 - 0.9   POC MID % 3.2 0 - 12 %M   POC Granulocyte 6.3 2 - 6.9   Granulocyte percent 62.2 37 - 80 %G   RBC 4.96 4.69 - 6.13 M/uL   Hemoglobin 12.5 (A) 14.1 - 18.1 g/dL   HCT, POC 39.7 (A) 43.5 - 53.7 %   MCV 80.1 80 - 97 fL   MCH, POC 25.1 (A) 27 - 31.2 pg   MCHC 31.4 (A)  31.8 - 35.4 g/dL   RDW, POC 15.3 %   Platelet Count, POC 309 142 - 424 K/uL   MPV 7.3 0 - 99.8 fL

## 2014-12-01 NOTE — Telephone Encounter (Signed)
See note below to be advised.  Linzie Collin, RN with patients work called to report symptoms the patient has been having. Starting after lunch today the patient began to feel shaky, and light headed. Patients BP was 152/100, pluse 88 and blood sugar 147. Patient stated he was not feeling well at all and felt like his speech was slurring. Patient had obvious drooping in his facial features reported by Linzie Collin. I advised RN Dr. Loanne Drilling was not in the office this afternoon and he should seek care at a urgent care or ED. RN agreed upon this and stated she would advise the patient.

## 2014-12-01 NOTE — Patient Instructions (Signed)

## 2014-12-01 NOTE — Telephone Encounter (Signed)
please call back: Please call PCP with this message, as I only manage DM

## 2014-12-02 NOTE — Telephone Encounter (Signed)
Please call one of our offices, to request an a new pcp

## 2014-12-02 NOTE — Telephone Encounter (Signed)
Patient called our office because he does not have PCP.

## 2014-12-02 NOTE — Telephone Encounter (Signed)
I contacted the patient and advised him to contact one of the [place] primary care locations to request a new PCP. Requested call back if patient would like to discuss.

## 2015-01-20 ENCOUNTER — Encounter: Payer: Self-pay | Admitting: Internal Medicine

## 2015-01-20 ENCOUNTER — Other Ambulatory Visit (INDEPENDENT_AMBULATORY_CARE_PROVIDER_SITE_OTHER): Payer: BLUE CROSS/BLUE SHIELD

## 2015-01-20 ENCOUNTER — Ambulatory Visit (INDEPENDENT_AMBULATORY_CARE_PROVIDER_SITE_OTHER): Payer: BLUE CROSS/BLUE SHIELD | Admitting: Internal Medicine

## 2015-01-20 VITALS — BP 162/100 | HR 90 | Temp 98.3°F | Resp 16 | Ht 75.0 in | Wt 263.0 lb

## 2015-01-20 DIAGNOSIS — E1039 Type 1 diabetes mellitus with other diabetic ophthalmic complication: Secondary | ICD-10-CM | POA: Diagnosis not present

## 2015-01-20 DIAGNOSIS — Z0000 Encounter for general adult medical examination without abnormal findings: Secondary | ICD-10-CM | POA: Diagnosis not present

## 2015-01-20 DIAGNOSIS — I10 Essential (primary) hypertension: Secondary | ICD-10-CM

## 2015-01-20 DIAGNOSIS — I110 Hypertensive heart disease with heart failure: Secondary | ICD-10-CM | POA: Insufficient documentation

## 2015-01-20 DIAGNOSIS — IMO0002 Reserved for concepts with insufficient information to code with codable children: Secondary | ICD-10-CM

## 2015-01-20 DIAGNOSIS — E1065 Type 1 diabetes mellitus with hyperglycemia: Secondary | ICD-10-CM

## 2015-01-20 LAB — COMPREHENSIVE METABOLIC PANEL
ALT: 18 U/L (ref 0–53)
AST: 20 U/L (ref 0–37)
Albumin: 4 g/dL (ref 3.5–5.2)
Alkaline Phosphatase: 84 U/L (ref 39–117)
BILIRUBIN TOTAL: 0.6 mg/dL (ref 0.2–1.2)
BUN: 21 mg/dL (ref 6–23)
CHLORIDE: 104 meq/L (ref 96–112)
CO2: 28 mEq/L (ref 19–32)
Calcium: 9.5 mg/dL (ref 8.4–10.5)
Creatinine, Ser: 1.62 mg/dL — ABNORMAL HIGH (ref 0.40–1.50)
GFR: 60.07 mL/min (ref >60.00)
Glucose, Bld: 73 mg/dL (ref 70–99)
POTASSIUM: 4.2 meq/L (ref 3.5–5.1)
SODIUM: 142 meq/L (ref 135–145)
TOTAL PROTEIN: 7.5 g/dL (ref 6.0–8.3)

## 2015-01-20 LAB — PSA: PSA: 0.68 ng/mL (ref 0.10–4.00)

## 2015-01-20 MED ORDER — LISINOPRIL 20 MG PO TABS: 20.0000 mg | ORAL_TABLET | Freq: Every day | ORAL | Status: DC

## 2015-01-20 NOTE — Assessment & Plan Note (Signed)
BP moderately elevated today. Will start ACE-I due to concurrent diabetes. Back in about 2 months for BMP and BP recheck. Checking BMP today as some signs of CKD on last 2 labs and concern for end organ damage from elevated blood pressure.

## 2015-01-20 NOTE — Assessment & Plan Note (Signed)
Per patient stable changes of the eyes. 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 He is currently taking 200 units of levemir daily and this is his only medicine for the diabetes. Adding ACE-I today. He does not check sugars regularly and reminded him to do so. Also some numbness in his feet which could be related to his diabetes.

## 2015-01-20 NOTE — Progress Notes (Signed)
Pre visit review using our clinic review tool, if applicable. No additional management support is needed unless otherwise documented below in the visit note. 

## 2015-01-20 NOTE — Patient Instructions (Signed)
We are going to check some blood work today and start a medicine for the blood pressure.   It is called lisinopril and it also helps to protect the kidneys from damage from the sugars and diabetes. This is especially important as you have gotten the diabetes so young in life.   Come back in 2-3 months so we can check the blood pressure.   Diabetes and Exercise Exercising regularly is important. It is not just about losing weight. It has many health benefits, such as:  Improving your overall fitness, flexibility, and endurance.  Increasing your bone density.  Helping with weight control.  Decreasing your body fat.  Increasing your muscle strength.  Reducing stress and tension.  Improving your overall health. People with diabetes who exercise gain additional benefits because exercise:  Reduces appetite.  Improves the body's use of blood sugar (glucose).  Helps lower or control blood glucose.  Decreases blood pressure.  Helps control blood lipids (such as cholesterol and triglycerides).  Improves the body's use of the hormone insulin by:  Increasing the body's insulin sensitivity.  Reducing the body's insulin needs.  Decreases the risk for heart disease because exercising:  Lowers cholesterol and triglycerides levels.  Increases the levels of good cholesterol (such as high-density lipoproteins [HDL]) in the body.  Lowers blood glucose levels. YOUR ACTIVITY PLAN  Choose an activity that you enjoy and set realistic goals. Your health care provider or diabetes educator can help you make an activity plan that works for you. Exercise regularly as directed by your health care provider. This includes:  Performing resistance training twice a week such as push-ups, sit-ups, lifting weights, or using resistance bands.  Performing 150 minutes of cardio exercises each week such as walking, running, or playing sports.  Staying active and spending no more than 90 minutes at one  time being inactive. Even short bursts of exercise are good for you. Three 10-minute sessions spread throughout the day are just as beneficial as a single 30-minute session. Some exercise ideas include:  Taking the dog for a walk.  Taking the stairs instead of the elevator.  Dancing to your favorite song.  Doing an exercise video.  Doing your favorite exercise with a friend. RECOMMENDATIONS FOR EXERCISING WITH TYPE 1 OR TYPE 2 DIABETES   Check your blood glucose before exercising. If blood glucose levels are greater than 240 mg/dL, check for urine ketones. Do not exercise if ketones are present.  Avoid injecting insulin into areas of the body that are going to be exercised. For example, avoid injecting insulin into:  The arms when playing tennis.  The legs when jogging.  Keep a record of:  Food intake before and after you exercise.  Expected peak times of insulin action.  Blood glucose levels before and after you exercise.  The type and amount of exercise you have done.  Review your records with your health care provider. Your health care provider will help you to develop guidelines for adjusting food intake and insulin amounts before and after exercising.  If you take insulin or oral hypoglycemic agents, watch for signs and symptoms of hypoglycemia. They include:  Dizziness.  Shaking.  Sweating.  Chills.  Confusion.  Drink plenty of water while you exercise to prevent dehydration or heat stroke. Body water is lost during exercise and must be replaced.  Talk to your health care provider before starting an exercise program to make sure it is safe for you. Remember, almost any type of activity is  better than none. Document Released: 09/14/2003 Document Revised: 11/08/2013 Document Reviewed: 12/01/2012 [facility] Patient Information 2015 [place], [place]. This information is not intended to replace advice given to you by your health care provider. Make sure you discuss any  questions you have with your health care provider.

## 2015-01-20 NOTE — Progress Notes (Signed)
   Subjective:    Patient ID: Alexander [name], male    DOB: June 14, 1971, 44 y.o.   MRN: [id number]  HPI The patient is a 44 YO man coming in new about his blood pressure. He has been having higher blood pressures the last several months. He has never been on blood pressure medications before. He has not been exercising the last 6 months as they have been moving and temporarily staying with his sister. The diet has also been a little different. He has concomitant diabetes and is insulin dependent. He is only able to do a one shot per day regimen so his control is not ideal.   PMH, [facility], social history reviewed and updated.   Review of Systems  Constitutional: Positive for activity change. Negative for fever, appetite change, fatigue and unexpected weight change.  HENT: Negative.   Eyes: Negative.   Respiratory: Negative for cough, chest tightness, shortness of breath and wheezing.   Cardiovascular: Negative for chest pain, palpitations and leg swelling.  Gastrointestinal: Negative for nausea, abdominal pain, diarrhea, constipation and abdominal distention.  Musculoskeletal: Negative.   Skin: Negative.   Neurological: Positive for numbness. Negative for dizziness, speech difficulty, weakness, light-headedness and headaches.  Psychiatric/Behavioral: Negative.       Objective:   Physical Exam  Constitutional: He is oriented to person, place, and time. He appears well-developed and well-nourished.  Overweight  HENT:  Head: Normocephalic and atraumatic.  Eyes: EOM are normal.  Neck: Normal range of motion.  Cardiovascular: Normal rate and regular rhythm.   Pulmonary/Chest: Effort normal and breath sounds normal. No respiratory distress. He has no wheezes. He has no rales.  Abdominal: Soft. Bowel sounds are normal. He exhibits no distension. There is no tenderness.  Musculoskeletal: He exhibits no edema.  Neurological: He is alert and oriented to person, place, and time. Coordination normal.   Skin: Skin is warm and dry.  Psychiatric: He has a normal mood and affect.   Filed Vitals:   01/20/15 0812  BP: 162/100  Pulse: 90  Temp: 98.3 F (36.8 C)  TempSrc: Oral  Resp: 16  Height: 6\' 3"  (1.905 m)  Weight: 263 lb (119.296 kg)  SpO2: 97%      Assessment & Plan:

## 2015-01-27 ENCOUNTER — Telehealth: Payer: Self-pay | Admitting: Internal Medicine

## 2015-01-27 ENCOUNTER — Ambulatory Visit (INDEPENDENT_AMBULATORY_CARE_PROVIDER_SITE_OTHER): Payer: BLUE CROSS/BLUE SHIELD | Admitting: Endocrinology

## 2015-01-27 VITALS — BP 158/98 | HR 90 | Temp 98.5°F | Resp 16 | Ht 75.0 in | Wt 270.0 lb

## 2015-01-27 DIAGNOSIS — IMO0002 Reserved for concepts with insufficient information to code with codable children: Secondary | ICD-10-CM

## 2015-01-27 DIAGNOSIS — E1065 Type 1 diabetes mellitus with hyperglycemia: Principal | ICD-10-CM

## 2015-01-27 DIAGNOSIS — E1039 Type 1 diabetes mellitus with other diabetic ophthalmic complication: Secondary | ICD-10-CM

## 2015-01-27 LAB — POCT GLYCOSYLATED HEMOGLOBIN (HGB A1C): Hemoglobin A1C: 9.5

## 2015-01-27 NOTE — Telephone Encounter (Signed)
Patient stated you called and left a message for him to call you, he is returning your call.

## 2015-01-27 NOTE — Progress Notes (Signed)
Subjective:    Patient ID: Alexander [name], male    DOB: [birth year], 44 y.o.   MRN: [id number]  HPI Pt returns for f/u of diabetes mellitus: DM type: insulin-requiring type 2 Dx'ed: [id number] Complications: sensory neuropathy and retinopathy.   Therapy: insulin since 2013.   DKA: never Severe hypoglycemia: never Pancreatitis: never Other: he has requested qd insulin.  He declines weight loss surgery.   Interval history: no cbg record, but states cbg's are in the 100's.  There is no trend throughout the day.  pt states he feels well in general.  He says he never misses the insulin.   He just started zestril a few days ago.  Past Medical History  Diagnosis Date  . DIABETES MELLITUS, TYPE II 03/20/2007  . HYPERCHOLESTEROLEMIA 07/21/2008  . HYPERTENSION 03/20/2007  . CHEST PAIN 08/28/2009    No past surgical history on file.  History   Social History  . Marital Status: Married    Spouse Name: N/A  . Number of Children: N/A  . Years of Education: N/A   Occupational History  . [employer]   Social History Main Topics  . Smoking status: Never Smoker   . Smokeless tobacco: Never Used  . Alcohol Use: No  . Drug Use: No  . Sexual Activity: Not on file   Other Topics Concern  . Not on file   Social History Narrative    Current Outpatient Prescriptions on File Prior to Visit  Medication Sig Dispense Refill  . cyclobenzaprine (FLEXERIL) 10 MG tablet Take 1 tablet (10 mg total) by mouth 2 (two) times daily as needed for muscle spasms. 20 tablet 0  . Folic Acid 7.5 MG TABS Take 1 tablet (7.5 mg total) by mouth daily. 30 tablet 11  . glucose blood (FREESTYLE LITE) test strip two times a day  Dx E11.9 100 each 0  . ibuprofen (ADVIL,MOTRIN) 600 MG tablet Take 1 tablet (600 mg total) by mouth every 6 (six) hours as needed. 30 tablet 0  . insulin detemir (LEVEMIR) 100 UNIT/ML injection Inject 2 mLs (200 Units total) into the skin every morning. 70 mL 11  . lisinopril  (PRINIVIL,ZESTRIL) 20 MG tablet Take 1 tablet (20 mg total) by mouth daily. 90 tablet 3  . tadalafil (CIALIS) 20 MG tablet Take 1 tablet (20 mg total) by mouth daily as needed for erectile dysfunction. 10 tablet 11   No current facility-administered medications on file prior to visit.    No Known Allergies  Family History  Problem Relation Age of Onset  . Diabetes Mother   . Diabetes Father   . Hypertension Father   . Diabetes Brother   . Hypertension Brother   . Diabetes Maternal Grandfather   . Diabetes Paternal Grandfather     BP 158/98 mmHg  Pulse 90  Temp(Src) 98.5 F (36.9 C) (Oral)  Resp 16  Ht 6\' 3"  (1.905 m)  Wt 270 lb (122.471 kg)  BMI 33.75 kg/m2  SpO2 98%   Review of Systems He denies hypoglycemia and weight change    Objective:   Physical Exam VITAL SIGNS:  See vs page GENERAL: no distress Pulses: dorsalis pedis intact bilat.   MSK: no deformity of the feet CV: 1+ bilat leg edema.   Skin:  no ulcer on the feet.  normal color and temp on the feet. Neuro: sensation is intact to touch on the feet, but decreased from normal. Ext: There is bilateral onychomycosis of the toenails.  Lab Results  Component Value Date   HGBA1C 7.5 12/01/2014   Today: A1c=9.5%    Assessment & Plan:  DM: worse: pt declines to increase insulin. i advised him of risks HTN: improved with increased rx  Patient is advised the following: Patient Instructions  check your blood sugar 2 times a day.  vary the time of day when you check, between before the 3 meals, and at bedtime.  also check if you have symptoms of your blood sugar being too high or too low.  please keep a record of the readings and bring it to your next appointment here.  please call us sooner if your blood sugar goes below 70, or if you have a lot of readings over 200.   Please come back for a follow-up appointment in 2 months.   Please continue the same levemir for now.   On this type of insulin schedule, you  should eat meals on a regular schedule.  If a meal is missed or significantly delayed, your blood sugar could go low.   Please continue the same medications for your blood pressure.

## 2015-01-27 NOTE — Patient Instructions (Addendum)
check your blood sugar 2 times a day.  vary the time of day when you check, between before the 3 meals, and at bedtime.  also check if you have symptoms of your blood sugar being too high or too low.  please keep a record of the readings and bring it to your next appointment here.  please call us sooner if your blood sugar goes below 70, or if you have a lot of readings over 200.   Please come back for a follow-up appointment in 2 months.   Please continue the same levemir for now.   On this type of insulin schedule, you should eat meals on a regular schedule.  If a meal is missed or significantly delayed, your blood sugar could go low.   Please continue the same medications for your blood pressure.

## 2015-03-27 ENCOUNTER — Encounter (HOSPITAL_COMMUNITY): Payer: Self-pay | Admitting: Emergency Medicine

## 2015-03-27 ENCOUNTER — Emergency Department (HOSPITAL_COMMUNITY): Payer: BLUE CROSS/BLUE SHIELD

## 2015-03-27 ENCOUNTER — Observation Stay (HOSPITAL_COMMUNITY)
Admission: EM | Admit: 2015-03-27 | Discharge: 2015-03-30 | Disposition: A | Discharge status: Home / Self Care | Payer: BLUE CROSS/BLUE SHIELD | Attending: Internal Medicine | Admitting: Internal Medicine

## 2015-03-27 DIAGNOSIS — I5041 Acute combined systolic (congestive) and diastolic (congestive) heart failure: Secondary | ICD-10-CM

## 2015-03-27 DIAGNOSIS — E119 Type 2 diabetes mellitus without complications: Secondary | ICD-10-CM | POA: Diagnosis not present

## 2015-03-27 DIAGNOSIS — R079 Chest pain, unspecified: Principal | ICD-10-CM | POA: Insufficient documentation

## 2015-03-27 DIAGNOSIS — Z794 Long term (current) use of insulin: Secondary | ICD-10-CM | POA: Diagnosis not present

## 2015-03-27 DIAGNOSIS — E1121 Type 2 diabetes mellitus with diabetic nephropathy: Secondary | ICD-10-CM

## 2015-03-27 DIAGNOSIS — E780 Pure hypercholesterolemia: Secondary | ICD-10-CM | POA: Insufficient documentation

## 2015-03-27 DIAGNOSIS — Z79899 Other long term (current) drug therapy: Secondary | ICD-10-CM | POA: Insufficient documentation

## 2015-03-27 DIAGNOSIS — I10 Essential (primary) hypertension: Secondary | ICD-10-CM | POA: Diagnosis not present

## 2015-03-27 DIAGNOSIS — E1122 Type 2 diabetes mellitus with diabetic chronic kidney disease: Secondary | ICD-10-CM

## 2015-03-27 DIAGNOSIS — R0600 Dyspnea, unspecified: Secondary | ICD-10-CM | POA: Diagnosis not present

## 2015-03-27 DIAGNOSIS — E1165 Type 2 diabetes mellitus with hyperglycemia: Secondary | ICD-10-CM | POA: Diagnosis present

## 2015-03-27 DIAGNOSIS — N179 Acute kidney failure, unspecified: Secondary | ICD-10-CM | POA: Diagnosis present

## 2015-03-27 DIAGNOSIS — I160 Hypertensive urgency: Secondary | ICD-10-CM | POA: Diagnosis present

## 2015-03-27 DIAGNOSIS — E1169 Type 2 diabetes mellitus with other specified complication: Secondary | ICD-10-CM | POA: Diagnosis present

## 2015-03-27 DIAGNOSIS — E785 Hyperlipidemia, unspecified: Secondary | ICD-10-CM | POA: Diagnosis not present

## 2015-03-27 DIAGNOSIS — R0789 Other chest pain: Secondary | ICD-10-CM | POA: Diagnosis not present

## 2015-03-27 DIAGNOSIS — R7989 Other specified abnormal findings of blood chemistry: Secondary | ICD-10-CM | POA: Diagnosis not present

## 2015-03-27 DIAGNOSIS — R9431 Abnormal electrocardiogram [ECG] [EKG]: Secondary | ICD-10-CM

## 2015-03-27 HISTORY — DX: Hypertensive urgency: I16.0

## 2015-03-27 LAB — CREATININE, SERUM
Creatinine, Ser: 1.69 mg/dL — ABNORMAL HIGH (ref 0.61–1.24)
GFR calc Af Amer: 56 mL/min — ABNORMAL LOW (ref >60)
GFR calc non Af Amer: 48 mL/min — ABNORMAL LOW (ref >60)

## 2015-03-27 LAB — CBC WITH DIFFERENTIAL/PLATELET
BASOS ABS: 0 10*3/uL (ref 0.0–0.1)
Basophils Relative: 0 %
Eosinophils Absolute: 0.6 10*3/uL (ref 0.0–0.7)
Eosinophils Relative: 7 %
HEMATOCRIT: 33.6 % — AB (ref 39.0–52.0)
Hemoglobin: 10.7 g/dL — ABNORMAL LOW (ref 13.0–17.0)
LYMPHS ABS: 2.4 10*3/uL (ref 0.7–4.0)
LYMPHS PCT: 28 %
MCH: 25.5 pg — ABNORMAL LOW (ref 26.0–34.0)
MCHC: 31.8 g/dL (ref 30.0–36.0)
MCV: 80 fL (ref 78.0–100.0)
MONO ABS: 0.5 10*3/uL (ref 0.1–1.0)
Monocytes Relative: 6 %
NEUTROS ABS: 5 10*3/uL (ref 1.7–7.7)
Neutrophils Relative %: 59 %
Platelets: 244 10*3/uL (ref 150–400)
RBC: 4.2 MIL/uL — ABNORMAL LOW (ref 4.22–5.81)
RDW: 14.1 % (ref 11.5–15.5)
WBC: 8.5 10*3/uL (ref 4.0–10.5)

## 2015-03-27 LAB — COMPREHENSIVE METABOLIC PANEL
ALBUMIN: 3.6 g/dL (ref 3.5–5.0)
ALT: 18 U/L (ref 17–63)
AST: 38 U/L (ref 15–41)
Alkaline Phosphatase: 63 U/L (ref 38–126)
Anion gap: 9 (ref 5–15)
BUN: 17 mg/dL (ref 6–20)
CHLORIDE: 106 mmol/L (ref 101–111)
CO2: 25 mmol/L (ref 22–32)
CREATININE: 1.76 mg/dL — AB (ref 0.61–1.24)
Calcium: 8.9 mg/dL (ref 8.9–10.3)
GFR calc Af Amer: 53 mL/min — ABNORMAL LOW (ref >60)
GFR calc non Af Amer: 46 mL/min — ABNORMAL LOW (ref >60)
GLUCOSE: 118 mg/dL — AB (ref 65–99)
Potassium: 4.5 mmol/L (ref 3.5–5.1)
SODIUM: 140 mmol/L (ref 135–145)
Total Bilirubin: 1.1 mg/dL (ref 0.3–1.2)
Total Protein: 7.2 g/dL (ref 6.5–8.1)

## 2015-03-27 LAB — CBC
HEMATOCRIT: 33.5 % — AB (ref 39.0–52.0)
Hemoglobin: 10.9 g/dL — ABNORMAL LOW (ref 13.0–17.0)
MCH: 26.1 pg (ref 26.0–34.0)
MCHC: 32.5 g/dL (ref 30.0–36.0)
MCV: 80.1 fL (ref 78.0–100.0)
PLATELETS: 283 10*3/uL (ref 150–400)
RBC: 4.18 MIL/uL — ABNORMAL LOW (ref 4.22–5.81)
RDW: 14 % (ref 11.5–15.5)
WBC: 9.6 10*3/uL (ref 4.0–10.5)

## 2015-03-27 LAB — I-STAT TROPONIN, ED: Troponin i, poc: 0.02 ng/mL (ref 0.00–0.08)

## 2015-03-27 LAB — TROPONIN I: Troponin I: 0.03 ng/mL (ref <0.031)

## 2015-03-27 LAB — BRAIN NATRIURETIC PEPTIDE: B NATRIURETIC PEPTIDE 5: 186.7 pg/mL — AB (ref 0.0–100.0)

## 2015-03-27 LAB — LIPASE, BLOOD: Lipase: 21 U/L — ABNORMAL LOW (ref 22–51)

## 2015-03-27 LAB — GLUCOSE, CAPILLARY: GLUCOSE-CAPILLARY: 81 mg/dL (ref 65–99)

## 2015-03-27 LAB — TSH: TSH: 2.261 u[IU]/mL (ref 0.350–4.500)

## 2015-03-27 IMAGING — DX DG CHEST 1V PORT
1 series · 1 of 1 positions shown · non-contrast
Comparison: [DATE]

CLINICAL DATA: Chest pain and shortness of breath for 2 days.

EXAM:
PORTABLE CHEST - 1 VIEW

[chest ap]
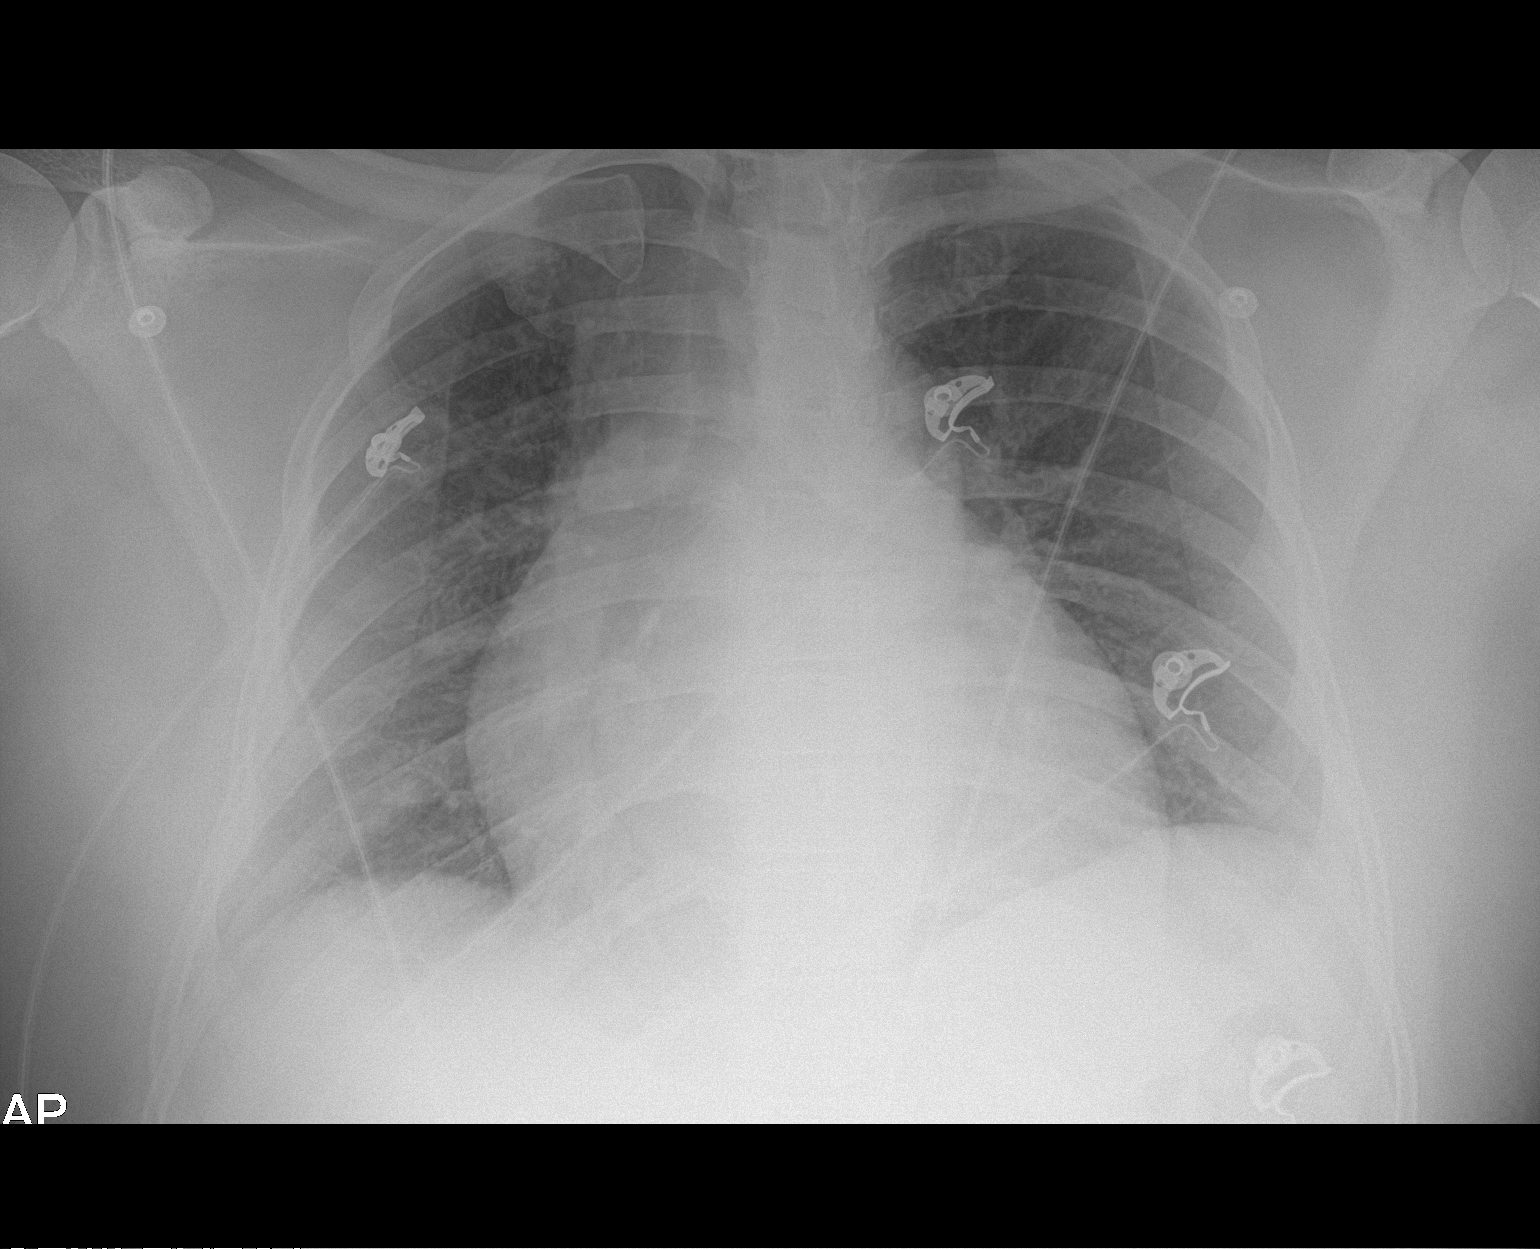

[1 of 1 positions shown; findings below may reference images not displayed]

FINDINGS: AP view is likely accentuating the size of the heart. Lungs are
clear without airspace disease or pulmonary edema. Patient is mildly
rotated towards the right. No large pleural effusions. Bony thorax
is intact.
IMPRESSION: No acute chest findings.

## 2015-03-27 MED ORDER — ACETAMINOPHEN 325 MG PO TABS
650.0000 mg | ORAL_TABLET | ORAL | Status: DC | PRN
  Administered 2015-03-28 – 2015-03-29 (×3): 650 mg via ORAL
  Filled 2015-03-27 (×3): qty 2

## 2015-03-27 MED ORDER — HEPARIN (PORCINE) IN NACL 100-0.45 UNIT/ML-% IJ SOLN
1400.0000 [IU]/h | INTRAMUSCULAR | Status: DC
  Administered 2015-03-27: 1400 [IU]/h via INTRAVENOUS
  Filled 2015-03-27: qty 250

## 2015-03-27 MED ORDER — HYDRALAZINE HCL 25 MG PO TABS
25.0000 mg | ORAL_TABLET | Freq: Three times a day (TID) | ORAL | Status: DC
  Administered 2015-03-27 – 2015-03-28 (×2): 25 mg via ORAL
  Filled 2015-03-27 (×2): qty 1

## 2015-03-27 MED ORDER — ATORVASTATIN CALCIUM 40 MG PO TABS
40.0000 mg | ORAL_TABLET | Freq: Every day | ORAL | Status: DC
  Administered 2015-03-28 – 2015-03-29 (×2): 40 mg via ORAL
  Filled 2015-03-27 (×3): qty 1

## 2015-03-27 MED ORDER — ACETAMINOPHEN 325 MG PO TABS
325.0000 mg | ORAL_TABLET | Freq: Once | ORAL | Status: AC
  Administered 2015-03-27: 325 mg via ORAL
  Filled 2015-03-27: qty 1

## 2015-03-27 MED ORDER — ENOXAPARIN SODIUM 40 MG/0.4ML ~~LOC~~ SOLN
40.0000 mg | SUBCUTANEOUS | Status: DC
  Administered 2015-03-28 – 2015-03-29 (×2): 40 mg via SUBCUTANEOUS
  Filled 2015-03-27 (×2): qty 0.4

## 2015-03-27 MED ORDER — METOPROLOL TARTRATE 12.5 MG HALF TABLET
12.5000 mg | ORAL_TABLET | Freq: Two times a day (BID) | ORAL | Status: DC
  Administered 2015-03-27 – 2015-03-28 (×2): 12.5 mg via ORAL
  Filled 2015-03-27 (×2): qty 1

## 2015-03-27 MED ORDER — ONDANSETRON HCL 4 MG/2ML IJ SOLN
4.0000 mg | Freq: Four times a day (QID) | INTRAMUSCULAR | Status: DC | PRN
  Filled 2015-03-27: qty 2

## 2015-03-27 MED ORDER — NITROGLYCERIN 0.4 MG SL SUBL: 0.4000 mg | SUBLINGUAL_TABLET | SUBLINGUAL | Status: DC | PRN

## 2015-03-27 MED ORDER — HEPARIN BOLUS VIA INFUSION
4000.0000 [IU] | Freq: Once | INTRAVENOUS | Status: AC
  Administered 2015-03-27: 4000 [IU] via INTRAVENOUS
  Filled 2015-03-27: qty 4000

## 2015-03-27 MED ORDER — SODIUM CHLORIDE 0.9 % IV SOLN
INTRAVENOUS | Status: DC
  Administered 2015-03-27: 21:00:00 via INTRAVENOUS

## 2015-03-27 MED ORDER — ASPIRIN EC 81 MG PO TBEC
81.0000 mg | DELAYED_RELEASE_TABLET | Freq: Every day | ORAL | Status: DC
  Administered 2015-03-28 – 2015-03-30 (×3): 81 mg via ORAL
  Filled 2015-03-27 (×3): qty 1

## 2015-03-27 MED ORDER — NITROGLYCERIN 0.4 MG SL SUBL
0.4000 mg | SUBLINGUAL_TABLET | SUBLINGUAL | Status: AC | PRN
  Administered 2015-03-27 (×3): 0.4 mg via SUBLINGUAL
  Filled 2015-03-27 (×2): qty 1

## 2015-03-27 MED ORDER — INSULIN ASPART 100 UNIT/ML ~~LOC~~ SOLN
0.0000 [IU] | Freq: Three times a day (TID) | SUBCUTANEOUS | Status: DC
  Administered 2015-03-29: 1 [IU] via SUBCUTANEOUS
  Administered 2015-03-29 – 2015-03-30 (×2): 3 [IU] via SUBCUTANEOUS
  Administered 2015-03-30: 5 [IU] via SUBCUTANEOUS

## 2015-03-27 MED ORDER — FOLIC ACID 7.5 MG PO TABS: 1.0000 | ORAL_TABLET | Freq: Every day | ORAL | Status: DC

## 2015-03-27 NOTE — Progress Notes (Signed)
ANTICOAGULATION CONSULT NOTE - Initial Consult  Pharmacy Consult for heparin  Indication: ACS / STEMI  No Known Allergies  Patient Measurements: Height: 6\' 3"  (190.5 cm) Weight: 260 lb (117.935 kg) IBW/kg (Calculated) : 84.5 Heparin Dosing Weight: 109 kg  Vital Signs: Temp: 98.3 F (36.8 C) (09/19 1617) Temp Source: Oral (09/19 1617) BP: 172/103 mmHg (09/19 1700) Pulse Rate: 79 (09/19 1700)  Labs:  Recent Labs  03/27/15 1656  HGB 10.7*  HCT 33.6*  PLT 244    CrCl cannot be calculated (Patient has no serum creatinine result on file.).   Medical History: Past Medical History  Diagnosis Date  . DIABETES MELLITUS, TYPE II 03/20/2007  . HYPERCHOLESTEROLEMIA 07/21/2008  . HYPERTENSION 03/20/2007  . CHEST PAIN 08/28/2009    Assessment: 44 yo M presents on 9/19 with worsening SOB and CP. Pharmacy consulted to start heparin for ACS. Hgb low at 10.7, plts wnl. No s/s of bleed. Not on any anticoag at home.  Goal of Therapy:  Heparin level 0.3-0.7 units/ml Monitor platelets by anticoagulation protocol: Yes   Plan:  Give 4,000 unit heparin BOLUS Start heparin gtt at 1400 units/hr Check 6hr HL Monitor daily HL, CBC, s/s of bleed  BATCHELDER,NATHAN J 03/27/2015,5:40 PM

## 2015-03-27 NOTE — ED Notes (Signed)
Pt states he woke up early yesterday morning feeling short of breath while laying flat and had some chest pain. Woke up this morning still feeling short of breath and "discomfort" in his chest, went to health services and had an "abnormal ekg" and was referred to ED and brought via EMS

## 2015-03-27 NOTE — H&P (Signed)
Patient ID: HEROD CONNERTON MRN: [id number], DOB/AGE: March 17, 1971   Admit date: 03/27/2015   Primary Physician: Olga Millers, MD Primary Cardiologist: New  Pt. Profile:  Alexander [name] is a 44 y.o. male with a history of HTN, HL, DM who presented to [facility] ED for evaluation of worsening SOB and CP.   HPI: Hx as above. Saturday night he woke up with a sudden SOB, that intermittently persisted throughout yesterday. He used his son's albuterol nebulizer twice yesterday that helped to improved breathing. Last night he slept without any difficultly. However this morning he again he had intermittent SOB and some nausea. He went to his work this morning, where he had some chest tightness while sitting at desk that lasted for about 20 minutes then stated to easing off. No radiation or nausea at that time. He then went on to be evaluated by the nurse at work, EKG was changed from his previous EKG and thus he was referred to the emergency department. For the past week, he has noticed intermittent SOB, not particularly related to exertion. No exertional CP. Denies palpitation, LE edema, headache, cough, recent illness, recent travel, melena or blood in stool.   Patient took 325 of aspirin prior to arrival. In ED, he still having some chest tightness. He was given SL nitro x 3 with somewhat improved. Started on IV heparin. POC trop is negative. Creatinine of 1.76. Hemoglobin of 10.7. CXR without acute finding. BP elevated to 183/106. EKG showed NSR at rate of 82 with non specific T wave abnormality in anterior lateral lead.   He was recently placed on lisinopril in July. He states that he has been not taking it for the past few days. However endorse taking this morning. Never smoker. Walks on treadmill for about 3 mins 1-2 times a week. Never seen by cardiologist. Last use of Cialis greater than 4 months ago. He denies use of any other erectile dysfunction medications.    Problem List  Past  Medical History  Diagnosis Date  . DIABETES MELLITUS, TYPE II 03/20/2007  . HYPERCHOLESTEROLEMIA 07/21/2008  . HYPERTENSION 03/20/2007  . CHEST PAIN 08/28/2009    History reviewed. No pertinent past surgical history.   Allergies  No Known Allergies   Home Medications  Prior to Admission medications   Medication Sig Start Date End Date Taking? Authorizing Provider  insulin detemir (LEVEMIR) 100 UNIT/ML injection Inject 2 mLs (200 Units total) into the skin every morning. Patient taking differently: Inject 200 Units into the skin every morning. Patient states he is taking 200 units daily per patient 10/28/14  Yes Renato Shin, MD  lisinopril (PRINIVIL,ZESTRIL) 20 MG tablet Take 1 tablet (20 mg total) by mouth daily. 01/20/15  Yes Olga Millers, MD  tadalafil (CIALIS) 20 MG tablet Take 1 tablet (20 mg total) by mouth daily as needed for erectile dysfunction. 07/15/14  Yes Renato Shin, MD  cyclobenzaprine (FLEXERIL) 10 MG tablet Take 1 tablet (10 mg total) by mouth 2 (two) times daily as needed for muscle spasms. Patient not taking: Reported on 03/27/2015 01/25/14   Jeannett Senior, PA-C  Folic Acid 7.5 MG TABS Take 1 tablet (7.5 mg total) by mouth daily. Patient not taking: Reported on 03/27/2015 07/15/12   Renato Shin, MD  glucose blood (FREESTYLE LITE) test strip two times a day  Dx E11.9 Patient not taking: Reported on 03/27/2015 09/16/14   Renato Shin, MD  ibuprofen (ADVIL,MOTRIN) 600 MG tablet Take 1 tablet (600 mg total)  by mouth every 6 (six) hours as needed. Patient not taking: Reported on 03/27/2015 01/25/14   Jeannett Senior, PA-C    Family History  Family History  Problem Relation Age of Onset  . Diabetes Mother   . Diabetes Father   . Hypertension Father   . Diabetes Brother   . Hypertension Brother   . Diabetes Maternal Grandfather   . Diabetes Paternal Grandfather    Family Status  Relation Status Death Age  . Mother Alive   . Father Alive   . Maternal  Grandmother Alive   . Maternal Grandfather Deceased   . Paternal Grandmother Deceased   . Paternal Grandfather Deceased      Social History  Social History   Social History  . Marital Status: Married    Spouse Name: N/A  . Number of Children: N/A  . Years of Education: N/A   Occupational History  . [employer]   Social History Main Topics  . Smoking status: Never Smoker   . Smokeless tobacco: Never Used  . Alcohol Use: No  . Drug Use: No  . Sexual Activity: Not on file   Other Topics Concern  . Not on file   Social History Narrative     Review of Systems General:  No chills, fever, night sweats or weight changes.  Cardiovascular:  No chest pain, dyspnea on exertion, edema, orthopnea, palpitations, paroxysmal nocturnal dyspnea. Dermatological: No rash, lesions/masses Respiratory: No cough, dyspnea Urologic: No hematuria, dysuria Abdominal:   No nausea, vomiting, diarrhea, bright red blood per rectum, melena, or hematemesis Neurologic:  No visual changes, wkns, changes in mental status. All other systems reviewed and are otherwise negative except as noted above.  Physical Exam  Blood pressure 180/96, pulse 81, temperature 98.3 F (36.8 C), temperature source Oral, resp. rate 21, height 6\' 3"  (1.905 m), weight 260 lb (117.935 kg), SpO2 99 %.  General: Pleasant, NAD Psych: Normal affect. Neuro: Alert and oriented X 3. Moves all extremities spontaneously. HEENT: Normal  Neck: Supple without bruits. + JVD? Lungs:  Resp regular and unlabored. Diminished breath sound bibasilar.  Heart: RRR no s3, s4, or murmurs. Abdomen: Soft, non-tender, non-distended, BS + x 4.  Extremities: No clubbing, cyanosis. Trace BL LE edema. DP/PT/Radials 2+ and equal bilaterally.  Labs  No results for input(s): CKTOTAL, CKMB, TROPONINI in the last 72 hours. Lab Results  Component Value Date   WBC 8.5 03/27/2015   HGB 10.7* 03/27/2015   HCT 33.6* 03/27/2015   MCV 80.0 03/27/2015    PLT 244 03/27/2015    Recent Labs Lab 03/27/15 1656  NA 140  K 4.5  CL 106  CO2 25  BUN 17  CREATININE 1.76*  CALCIUM 8.9  PROT 7.2  BILITOT 1.1  ALKPHOS 63  ALT 18  AST 38  GLUCOSE 118*   Lab Results  Component Value Date   CHOL 255* 07/15/2014   HDL 32.10* 07/15/2014   TRIG 301.0* 07/15/2014   No results found for: DDIMER   Radiology/Studies  Dg Chest Port 1 View  03/27/2015   CLINICAL DATA:  Chest pain and shortness of breath for 2 days.  EXAM: PORTABLE CHEST - 1 VIEW  COMPARISON:  01/25/2014  FINDINGS: AP view is likely accentuating the size of the heart. Lungs are clear without airspace disease or pulmonary edema. Patient is mildly rotated towards the right. No large pleural effusions. Bony thorax is intact.  IMPRESSION: No acute chest findings.   Electronically Signed   By: Quita Skye  Anselm Pancoast M.D.   On: 03/27/2015 17:03    ECG  Vent. rate 82 BPM PR interval 132 ms QRS duration 80 ms QT/QTc 384/448 ms P-R-T axes 68 33 86  ASSESSMENT AND PLAN   1. Atypical chest pain with SOB - His symptoms is concerning for cardiac etiology. Less suspicious of PE. He as multiple risk factor including uncontrolled HTN, DM and HL. EKG with new non specific TW abnormality in anterior lateral lead.  - Will admit him for observation and cycle troponin. Repeat EKG in morning.  - Will get echo. Will keep NPO tonight, and reevaluate in morning.  - Discontinue IV heparin.   2. HTN - Presented with BP of 183/106. Non compliant to medications. Could be a cause of CP.  - Will start on BB. Hold lisinopril due to AKI. If not controlled, need secondary HTN work up given recent increased in creatinine.  - Add amlodipine if needed.   3. AKI - Creatinine of 1.76. Creatinine is up since May 2016. GFR today 46. Will gently hydrate overnight and reassess in morning. BNP of 186.7. Monitor closely.   - hold lisinopril for now.   4. HL 07/15/2014: Cholesterol 255*; HDL 32.10*; Triglycerides 301.0*;  VLDL 60.2*  - Will get lipid panel.   5. DM - HgbA1c of 9.5 -  01/27/15. Managed by endocrinologist, Dr. Loanne Drilling.  - Will place him on SSI  6. Obesity - Estimated body mass index is 32.5 kg/(m^2) as calculated from the following:   Height as of this encounter: 6\' 3"  (1.905 m).   Weight as of this encounter: 260 lb (117.935 kg).  - Encouraged daily exeresis  7. Anemia - Hemoglobin of 10.7. Denies melena or blood in stool. Follow closely. ?due to kidney disease.   Signed, Leanor Kail, PA-C 03/27/2015, 6:05 PM

## 2015-03-27 NOTE — ED Provider Notes (Signed)
CSN: [id number]     Arrival date & time 03/27/15  1612 History   First MD Initiated Contact with Patient 03/27/15 1622     Chief Complaint  Patient presents with  . Chest Pain     (Consider location/radiation/quality/duration/timing/severity/associated sxs/prior Treatment) HPI Patient took 325 of aspirin prior to arrival. Patient states night before last, he awoke at about 3 in the morning feeling very short of breath. He continued to feel short of breath over the course of the day and had some episodes of nausea. Today while at work he developed chest tightness that was "very uncomfortable", he denies radiation of pain. He then went on to be evaluated by the nurse, they did an EKG that was changed from his previous EKG and thus he was referred to the emergency department. He still endorses some sense of chest tightness at this time. He qualifies it is less intense than it had been previously. He has not had recent fever, chills or cough. Last use of Cialis greater than 4 months ago. He denies use of any other erectile dysfunction medications. Past Medical History  Diagnosis Date  . DIABETES MELLITUS, TYPE II 03/20/2007  . HYPERCHOLESTEROLEMIA 07/21/2008  . HYPERTENSION 03/20/2007  . CHEST PAIN 08/28/2009   History reviewed. No pertinent past surgical history. Family History  Problem Relation Age of Onset  . Diabetes Mother   . Diabetes Father   . Hypertension Father   . Diabetes Brother   . Hypertension Brother   . Diabetes Maternal Grandfather   . Diabetes Paternal Grandfather    Social History  Substance Use Topics  . Smoking status: Never Smoker   . Smokeless tobacco: Never Used  . Alcohol Use: No    Review of Systems  10 Systems reviewed and are negative for acute change except as noted in the HPI.   Allergies  Review of patient's allergies indicates no known allergies.  Home Medications   Prior to Admission medications   Medication Sig Start Date End Date Taking?  Authorizing Provider  insulin detemir (LEVEMIR) 100 UNIT/ML injection Inject 2 mLs (200 Units total) into the skin every morning. Patient taking differently: Inject 200 Units into the skin every morning. Patient states he is taking 200 units daily per patient 10/28/14  Yes Renato Shin, MD  lisinopril (PRINIVIL,ZESTRIL) 20 MG tablet Take 1 tablet (20 mg total) by mouth daily. 01/20/15  Yes Olga Millers, MD  tadalafil (CIALIS) 20 MG tablet Take 1 tablet (20 mg total) by mouth daily as needed for erectile dysfunction. 07/15/14  Yes Renato Shin, MD  cyclobenzaprine (FLEXERIL) 10 MG tablet Take 1 tablet (10 mg total) by mouth 2 (two) times daily as needed for muscle spasms. Patient not taking: Reported on 03/27/2015 01/25/14   Jeannett Senior, PA-C  Folic Acid 7.5 MG TABS Take 1 tablet (7.5 mg total) by mouth daily. Patient not taking: Reported on 03/27/2015 07/15/12   Renato Shin, MD  glucose blood (FREESTYLE LITE) test strip two times a day  Dx E11.9 Patient not taking: Reported on 03/27/2015 09/16/14   Renato Shin, MD  ibuprofen (ADVIL,MOTRIN) 600 MG tablet Take 1 tablet (600 mg total) by mouth every 6 (six) hours as needed. Patient not taking: Reported on 03/27/2015 01/25/14   Tatyana Kirichenko, PA-C   BP 145/97 mmHg  Pulse 91  Temp(Src) 98.9 F (37.2 C) (Oral)  Resp 16  Ht 6\' 3"  (1.905 m)  Wt 265 lb 1.6 oz (120.249 kg)  BMI 33.14 kg/m2  SpO2  98% Physical Exam  Constitutional: He is oriented to person, place, and time. He appears well-developed and well-nourished.  HENT:  Head: Normocephalic and atraumatic.  Eyes: EOM are normal. Pupils are equal, round, and reactive to light.  Neck: Neck supple.  Cardiovascular: Normal rate, regular rhythm, normal heart sounds and intact distal pulses.   Pulmonary/Chest: Effort normal and breath sounds normal.  Abdominal: Soft. Bowel sounds are normal. He exhibits no distension. There is no tenderness.  Musculoskeletal: Normal range of motion. He  exhibits no edema.  Neurological: He is alert and oriented to person, place, and time. He has normal strength. Coordination normal. GCS eye subscore is 4. GCS verbal subscore is 5. GCS motor subscore is 6.  Skin: Skin is warm, dry and intact.  Psychiatric: He has a normal mood and affect.    ED Course  Procedures (including critical care time) Labs Review Labs Reviewed  COMPREHENSIVE METABOLIC PANEL - Abnormal; Notable for the following:    Glucose, Bld 118 (*)    Creatinine, Ser 1.76 (*)    GFR calc non Af Amer 46 (*)    GFR calc Af Amer 53 (*)    All other components within normal limits  LIPASE, BLOOD - Abnormal; Notable for the following:    Lipase 21 (*)    All other components within normal limits  BRAIN NATRIURETIC PEPTIDE - Abnormal; Notable for the following:    B Natriuretic Peptide 186.7 (*)    All other components within normal limits  CBC WITH DIFFERENTIAL/PLATELET - Abnormal; Notable for the following:    RBC 4.20 (*)    Hemoglobin 10.7 (*)    HCT 33.6 (*)    MCH 25.5 (*)    All other components within normal limits  CBC - Abnormal; Notable for the following:    RBC 4.18 (*)    Hemoglobin 10.9 (*)    HCT 33.5 (*)    All other components within normal limits  CREATININE, SERUM - Abnormal; Notable for the following:    Creatinine, Ser 1.69 (*)    GFR calc non Af Amer 48 (*)    GFR calc Af Amer 56 (*)    All other components within normal limits  TSH  TROPONIN I  GLUCOSE, CAPILLARY  CBC  TROPONIN I  TROPONIN I  LIPID PANEL  BASIC METABOLIC PANEL  Randolm Idol, ED    Imaging Review Dg Chest Port 1 View  03/27/2015   CLINICAL DATA:  Chest pain and shortness of breath for 2 days.  EXAM: PORTABLE CHEST - 1 VIEW  COMPARISON:  01/25/2014  FINDINGS: AP view is likely accentuating the size of the heart. Lungs are clear without airspace disease or pulmonary edema. Patient is mildly rotated towards the right. No large pleural effusions. Bony thorax is intact.   IMPRESSION: No acute chest findings.   Electronically Signed   By: Markus Daft M.D.   On: 03/27/2015 17:03   I have personally reviewed and evaluated these images and lab results as part of my medical decision-making.   EKG Interpretation   Date/Time:  Monday March 27 2015 16:13:31 EDT Ventricular Rate:  82 PR Interval:  132 QRS Duration: 80 QT Interval:  384 QTC Calculation: 448 R Axis:   33 Text Interpretation:  Sinus rhythm Probable left atrial enlargement  Nonspecific T abnrm, anterolateral leads agree. dynamic t wave change from  previous Confirmed by Johnney Killian, MD, Jeannie Done [phone]) on 03/27/2015 4:30:45 PM     Consult: Cardiology patient will  be seen in the emergency department and assessed for admission. MDM   Final diagnoses:  Chest pain, unspecified chest pain type  Dyspnea  EKG abnormality   Patient resents with acute onset of dyspnea that awoke him from sleep. He continued had dyspnea and then developed chest tightness as well. Today EKG abnormality is identified. Findings are suggestive of ACS. Patient be admitted to cardiology for further rule out of MI.     Charlesetta Shanks, MD 03/28/15 [phone]

## 2015-03-27 NOTE — ED Notes (Signed)
MD at bedside. 

## 2015-03-27 NOTE — ED Notes (Signed)
Attempted to call report, told by Butch Penny that the RN will call back to receive report.

## 2015-03-28 ENCOUNTER — Encounter (HOSPITAL_COMMUNITY): Payer: Self-pay | Admitting: General Practice

## 2015-03-28 ENCOUNTER — Observation Stay (HOSPITAL_COMMUNITY): Payer: BLUE CROSS/BLUE SHIELD

## 2015-03-28 DIAGNOSIS — E1165 Type 2 diabetes mellitus with hyperglycemia: Secondary | ICD-10-CM | POA: Diagnosis not present

## 2015-03-28 DIAGNOSIS — E1122 Type 2 diabetes mellitus with diabetic chronic kidney disease: Secondary | ICD-10-CM | POA: Diagnosis not present

## 2015-03-28 DIAGNOSIS — N189 Chronic kidney disease, unspecified: Secondary | ICD-10-CM

## 2015-03-28 DIAGNOSIS — I519 Heart disease, unspecified: Secondary | ICD-10-CM

## 2015-03-28 DIAGNOSIS — N179 Acute kidney failure, unspecified: Secondary | ICD-10-CM | POA: Diagnosis not present

## 2015-03-28 DIAGNOSIS — R0600 Dyspnea, unspecified: Secondary | ICD-10-CM

## 2015-03-28 DIAGNOSIS — E1121 Type 2 diabetes mellitus with diabetic nephropathy: Secondary | ICD-10-CM | POA: Diagnosis not present

## 2015-03-28 DIAGNOSIS — I10 Essential (primary) hypertension: Secondary | ICD-10-CM | POA: Diagnosis not present

## 2015-03-28 DIAGNOSIS — I209 Angina pectoris, unspecified: Secondary | ICD-10-CM | POA: Diagnosis not present

## 2015-03-28 DIAGNOSIS — E785 Hyperlipidemia, unspecified: Secondary | ICD-10-CM | POA: Diagnosis not present

## 2015-03-28 DIAGNOSIS — I5041 Acute combined systolic (congestive) and diastolic (congestive) heart failure: Secondary | ICD-10-CM | POA: Diagnosis not present

## 2015-03-28 HISTORY — DX: Heart disease, unspecified: I51.9

## 2015-03-28 LAB — CBC
HCT: 32.8 % — ABNORMAL LOW (ref 39.0–52.0)
HEMOGLOBIN: 10.5 g/dL — AB (ref 13.0–17.0)
MCH: 25.6 pg — AB (ref 26.0–34.0)
MCHC: 32 g/dL (ref 30.0–36.0)
MCV: 80 fL (ref 78.0–100.0)
Platelets: 276 10*3/uL (ref 150–400)
RBC: 4.1 MIL/uL — ABNORMAL LOW (ref 4.22–5.81)
RDW: 14.1 % (ref 11.5–15.5)
WBC: 7.9 10*3/uL (ref 4.0–10.5)

## 2015-03-28 LAB — BASIC METABOLIC PANEL
Anion gap: 6 (ref 5–15)
BUN: 20 mg/dL (ref 6–20)
CALCIUM: 8.6 mg/dL — AB (ref 8.9–10.3)
CHLORIDE: 108 mmol/L (ref 101–111)
CO2: 25 mmol/L (ref 22–32)
CREATININE: 1.59 mg/dL — AB (ref 0.61–1.24)
GFR calc non Af Amer: 52 mL/min — ABNORMAL LOW (ref >60)
GFR, EST AFRICAN AMERICAN: 60 mL/min — AB (ref >60)
GLUCOSE: 81 mg/dL (ref 65–99)
Potassium: 4.1 mmol/L (ref 3.5–5.1)
Sodium: 139 mmol/L (ref 135–145)

## 2015-03-28 LAB — LIPID PANEL
CHOL/HDL RATIO: 6.1 ratio
CHOLESTEROL: 200 mg/dL (ref 0–200)
HDL: 33 mg/dL — AB (ref >40)
LDL Cholesterol: 151 mg/dL — ABNORMAL HIGH (ref 0–99)
TRIGLYCERIDES: 81 mg/dL (ref <150)
VLDL: 16 mg/dL (ref 0–40)

## 2015-03-28 LAB — GLUCOSE, CAPILLARY
GLUCOSE-CAPILLARY: 78 mg/dL (ref 65–99)
Glucose-Capillary: 75 mg/dL (ref 65–99)
Glucose-Capillary: 95 mg/dL (ref 65–99)

## 2015-03-28 LAB — TROPONIN I: Troponin I: <0.03 ng/mL (ref <0.031)

## 2015-03-28 MED ORDER — INFLUENZA VAC SPLIT QUAD 0.5 ML IM SUSY
0.5000 mL | PREFILLED_SYRINGE | INTRAMUSCULAR | Status: AC
  Administered 2015-03-29: 0.5 mL via INTRAMUSCULAR
  Filled 2015-03-28: qty 0.5

## 2015-03-28 MED ORDER — FUROSEMIDE 10 MG/ML IJ SOLN
40.0000 mg | Freq: Once | INTRAMUSCULAR | Status: AC
  Administered 2015-03-28: 40 mg via INTRAVENOUS
  Filled 2015-03-28: qty 4

## 2015-03-28 MED ORDER — ISOSORBIDE MONONITRATE ER 30 MG PO TB24
30.0000 mg | ORAL_TABLET | Freq: Every day | ORAL | Status: DC
  Administered 2015-03-28 – 2015-03-30 (×3): 30 mg via ORAL
  Filled 2015-03-28 (×3): qty 1

## 2015-03-28 MED ORDER — LISINOPRIL 20 MG PO TABS
20.0000 mg | ORAL_TABLET | Freq: Every day | ORAL | Status: DC
  Administered 2015-03-28: 20 mg via ORAL
  Filled 2015-03-28: qty 1

## 2015-03-28 MED ORDER — HYDRALAZINE HCL 50 MG PO TABS
50.0000 mg | ORAL_TABLET | Freq: Three times a day (TID) | ORAL | Status: DC
  Administered 2015-03-28 – 2015-03-29 (×3): 50 mg via ORAL
  Filled 2015-03-28 (×3): qty 1

## 2015-03-28 MED ORDER — CARVEDILOL 6.25 MG PO TABS
6.2500 mg | ORAL_TABLET | Freq: Two times a day (BID) | ORAL | Status: DC
  Administered 2015-03-28 – 2015-03-30 (×4): 6.25 mg via ORAL
  Filled 2015-03-28 (×4): qty 1

## 2015-03-28 NOTE — Progress Notes (Signed)
  Echocardiogram 2D Echocardiogram has been performed.  Alexander [name] 03/28/2015, 1:47 PM

## 2015-03-28 NOTE — Progress Notes (Signed)
Patient Profile: 44 year old male with no prior cardiac history, however risk factors including hypertension, dyslipidemia and poorly controlled diabetes, who presented  to the hospital 9/19 for worsening shortness of breath and chest pain, in the setting of hypertensive urgency, dehydration, acute on chronic renal insufficieny and elevated blood glucose over 350 mg/dL.   BNP only 187. Cardiac enzymes negative x 3.   Subjective: Feels better. CP resolved but still with occasional dyspnea.   Objective: Vital signs in last 24 hours: Temp:  [98 F (36.7 C)-98.9 F (37.2 C)] 98 F (36.7 C) (09/20 0609) Pulse Rate:  [79-91] 82 (09/20 0609) Resp:  [15-28] 16 (09/20 0609) BP: (145-209)/(84-112) 165/112 mmHg (09/20 0609) SpO2:  [98 %-100 %] 98 % (09/20 0609) Weight:  [260 lb (117.935 kg)-265 lb 9.6 oz (120.475 kg)] 265 lb 9.6 oz (120.475 kg) (09/20 [id number])    Intake/Output from previous day: 09/19 0701 - 09/20 0700 In: -  Out: 1150 [Urine:1150] Intake/Output this shift:    Medications Current Facility-Administered Medications  Medication Dose Route Frequency Provider Last Rate Last Dose  . 0.9 %  sodium chloride infusion   Intravenous Continuous Bhavinkumar Bhagat, PA 75 mL/hr at 03/27/15 2052    . acetaminophen (TYLENOL) tablet 650 mg  650 mg Oral Q4H PRN Bhavinkumar Bhagat, PA      . aspirin EC tablet 81 mg  81 mg Oral Daily Bhavinkumar Bhagat, PA   81 mg at 03/28/15 0810  . atorvastatin (LIPITOR) tablet 40 mg  40 mg Oral q1800 Bhavinkumar Bhagat, PA      . enoxaparin (LOVENOX) injection 40 mg  40 mg Subcutaneous Q24H Bhavinkumar Bhagat, PA   40 mg at 03/27/15 2054  . hydrALAZINE (APRESOLINE) tablet 25 mg  25 mg Oral 3 times per day Larey Dresser, MD   25 mg at 03/28/15 0610  . insulin aspart (novoLOG) injection 0-15 Units  0-15 Units Subcutaneous TID WC Bhavinkumar Bhagat, PA   0 Units at 03/28/15 0757  . metoprolol tartrate (LOPRESSOR) tablet 12.5 mg  12.5 mg Oral BID  Bhavinkumar Bhagat, PA   12.5 mg at 03/28/15 0809  . nitroGLYCERIN (NITROSTAT) SL tablet 0.4 mg  0.4 mg Sublingual Q5 Min x 3 PRN Bhavinkumar Bhagat, PA      . ondansetron (ZOFRAN) injection 4 mg  4 mg Intravenous Q6H PRN Bhavinkumar Bhagat, PA        PE: General appearance: alert, cooperative and moderately obese Neck: no carotid bruit and no JVD Lungs: slightly decreased BS at the bases, otherwise CTAB Heart: regular rate and rhythm, S1, S2 normal, no murmur, click, rub or gallop Extremities: no LEE Pulses: 2+ and symmetric Skin: warm and dry Neurologic: Grossly normal  Lab Results:   Recent Labs  03/27/15 1656 03/27/15 2128 03/28/15 0242  WBC 8.5 9.6 7.9  HGB 10.7* 10.9* 10.5*  HCT 33.6* 33.5* 32.8*  PLT 244 283 276   BMET  Recent Labs  03/27/15 1656 03/27/15 2128 03/28/15 0242  NA 140  --  139  K 4.5  --  4.1  CL 106  --  108  CO2 25  --  25  GLUCOSE 118*  --  81  BUN 17  --  20  CREATININE 1.76* 1.69* 1.59*  CALCIUM 8.9  --  8.6*   PT/INR No results for input(s): LABPROT, INR in the last 72 hours. Cholesterol  Recent Labs  03/28/15 0242  CHOL 200   Cardiac Panel (last 3 results)  Recent Labs  03/27/15 2128 03/28/15 0242  TROPONINI 0.03 <0.03    Studies/Results: 2D echo pending   Assessment/Plan  Active Problems:   Atypical chest pain   Acute renal failure syndrome   Poorly controlled diabetes mellitus   Dyslipidemia   Hypertensive urgency    1. Hypertensive Urgency: systolic BP improved some, however diastolic pressure remains elevated. Most recent BP 165/112. ACE-I is on hold due to renal insufficieny. He is on 25 mg of hydralazine TID and 12.5 of metoprolol BID. Will refrain from use of HCTZ given renal function. Can further increase hydralazine and metoprolol for better pressure control. 2D echo pending to assess for hypertensive cardiac disease.    2. Chest Pain: resolved. Cardiac enzymes are negative x 3. Given risk factors, will  need stress test at some point, however may be best to get BP under control prior to stress.  3. Acute on Chronic Renal Insufficieny: Improving with IVFs. SCr down trending towards his baseline of ~1.5 (1.76-->1.69-->1.59). Lisinopril on hold.   4. Diabetes, Poorly Controlled: plasma glucose improved at 81 mg/dL this am. His last Hgb A1c 2 months ago was 9.5. Continue insulin. Will need close OP f/u with his PCP.      Brittainy M. Rosita Fire, PA-C 03/28/2015 8:10 AM   I have seen and examined the patient along with Brittainy M. Rosita Fire, PA-C.  I have reviewed the chart, notes and new data.  I agree with PA's note.   PLAN: Switch to carvedilol and increase hydralazine/add isosorbide for better BP control. Resume lisinopril. Echo pending  Sanda Klein, MD, [place] [phone] 03/28/2015, 9:44 AM

## 2015-03-28 NOTE — Progress Notes (Signed)
He describes mild orthopnea. Echo shows depressed LV function with LVH and global hypokinesis, moderate diastolic dysfunction and increased mean left atrial pressure. Stop IV fluids. One dose of loop diuretic.BMET in AM.  Sanda Klein, MD, [facility] [phone] office [phone] pager

## 2015-03-29 ENCOUNTER — Observation Stay (HOSPITAL_COMMUNITY): Payer: BLUE CROSS/BLUE SHIELD

## 2015-03-29 ENCOUNTER — Ambulatory Visit (HOSPITAL_COMMUNITY): Payer: BLUE CROSS/BLUE SHIELD

## 2015-03-29 DIAGNOSIS — E785 Hyperlipidemia, unspecified: Secondary | ICD-10-CM | POA: Diagnosis not present

## 2015-03-29 DIAGNOSIS — E1121 Type 2 diabetes mellitus with diabetic nephropathy: Secondary | ICD-10-CM

## 2015-03-29 DIAGNOSIS — N179 Acute kidney failure, unspecified: Secondary | ICD-10-CM | POA: Diagnosis not present

## 2015-03-29 DIAGNOSIS — E1122 Type 2 diabetes mellitus with diabetic chronic kidney disease: Secondary | ICD-10-CM

## 2015-03-29 DIAGNOSIS — R079 Chest pain, unspecified: Secondary | ICD-10-CM | POA: Diagnosis not present

## 2015-03-29 DIAGNOSIS — I5041 Acute combined systolic (congestive) and diastolic (congestive) heart failure: Secondary | ICD-10-CM

## 2015-03-29 DIAGNOSIS — I10 Essential (primary) hypertension: Secondary | ICD-10-CM | POA: Diagnosis not present

## 2015-03-29 LAB — BASIC METABOLIC PANEL
Anion gap: 9 (ref 5–15)
BUN: 20 mg/dL (ref 6–20)
CHLORIDE: 108 mmol/L (ref 101–111)
CO2: 23 mmol/L (ref 22–32)
CREATININE: 1.71 mg/dL — AB (ref 0.61–1.24)
Calcium: 9.5 mg/dL (ref 8.9–10.3)
GFR calc Af Amer: 55 mL/min — ABNORMAL LOW (ref >60)
GFR calc non Af Amer: 47 mL/min — ABNORMAL LOW (ref >60)
GLUCOSE: 154 mg/dL — AB (ref 65–99)
POTASSIUM: 4.9 mmol/L (ref 3.5–5.1)
Sodium: 140 mmol/L (ref 135–145)

## 2015-03-29 LAB — GLUCOSE, CAPILLARY
GLUCOSE-CAPILLARY: 182 mg/dL — AB (ref 65–99)
GLUCOSE-CAPILLARY: 160 mg/dL — AB (ref 65–99)
GLUCOSE-CAPILLARY: 221 mg/dL — AB (ref 65–99)
Glucose-Capillary: 192 mg/dL — ABNORMAL HIGH (ref 65–99)

## 2015-03-29 LAB — CBC
HCT: 33.1 % — ABNORMAL LOW (ref 39.0–52.0)
HEMOGLOBIN: 10.6 g/dL — AB (ref 13.0–17.0)
MCH: 25.5 pg — ABNORMAL LOW (ref 26.0–34.0)
MCHC: 32 g/dL (ref 30.0–36.0)
MCV: 79.8 fL (ref 78.0–100.0)
PLATELETS: 313 10*3/uL (ref 150–400)
RBC: 4.15 MIL/uL — ABNORMAL LOW (ref 4.22–5.81)
RDW: 14.1 % (ref 11.5–15.5)
WBC: 8.2 10*3/uL (ref 4.0–10.5)

## 2015-03-29 LAB — NM MYOCAR MULTI W/SPECT W/WALL MOTION / EF
CHL CUP MPHR: 177 {beats}/min
CHL CUP NUCLEAR SDS: 5
CHL CUP NUCLEAR SRS: 2
CSEPHR: 81 %
Exercise duration (min): 8 min
Exercise duration (sec): 15 s
LHR: 0.25
LV sys vol: 132 mL
LVDIAVOL: 197 mL
NUC STRESS TID: 0.95
Peak HR: 144 {beats}/min
Rest HR: 93 {beats}/min
SSS: 5

## 2015-03-29 MED ORDER — HYDRALAZINE HCL 50 MG PO TABS
75.0000 mg | ORAL_TABLET | Freq: Three times a day (TID) | ORAL | Status: DC
  Administered 2015-03-29 – 2015-03-30 (×3): 75 mg via ORAL
  Filled 2015-03-29 (×6): qty 1

## 2015-03-29 MED ORDER — LISINOPRIL 20 MG PO TABS
20.0000 mg | ORAL_TABLET | Freq: Every day | ORAL | Status: DC
  Administered 2015-03-29 – 2015-03-30 (×2): 20 mg via ORAL
  Filled 2015-03-29: qty 2
  Filled 2015-03-29: qty 1

## 2015-03-29 MED ORDER — POLYETHYLENE GLYCOL 3350 17 G PO PACK
17.0000 g | PACK | Freq: Every day | ORAL | Status: DC | PRN
  Administered 2015-03-29 – 2015-03-30 (×2): 17 g via ORAL
  Filled 2015-03-29 (×2): qty 1

## 2015-03-29 MED ORDER — TECHNETIUM TC 99M SESTAMIBI GENERIC - CARDIOLITE
10.0000 | Freq: Once | INTRAVENOUS | Status: AC | PRN
  Administered 2015-03-29: 10 via INTRAVENOUS

## 2015-03-29 MED ORDER — HYDRALAZINE HCL 25 MG PO TABS
25.0000 mg | ORAL_TABLET | Freq: Once | ORAL | Status: AC
  Administered 2015-03-29: 25 mg via ORAL
  Filled 2015-03-29: qty 1

## 2015-03-29 MED ORDER — IBUPROFEN 200 MG PO TABS
400.0000 mg | ORAL_TABLET | Freq: Once | ORAL | Status: AC
  Administered 2015-03-29: 400 mg via ORAL
  Filled 2015-03-29: qty 2

## 2015-03-29 MED ORDER — POLYETHYLENE GLYCOL 3350 17 G PO PACK: 17.0000 g | PACK | Freq: Every day | ORAL | Status: DC | PRN

## 2015-03-29 MED ORDER — REGADENOSON 0.4 MG/5ML IV SOLN
0.4000 mg | Freq: Once | INTRAVENOUS | Status: AC
  Administered 2015-03-29: 0.4 mg via INTRAVENOUS
  Filled 2015-03-29: qty 5

## 2015-03-29 MED ORDER — TECHNETIUM TC 99M SESTAMIBI GENERIC - CARDIOLITE
30.0000 | Freq: Once | INTRAVENOUS | Status: AC | PRN
  Administered 2015-03-29: 30 via INTRAVENOUS

## 2015-03-29 MED ORDER — REGADENOSON 0.4 MG/5ML IV SOLN
INTRAVENOUS | Status: AC
  Filled 2015-03-29: qty 5

## 2015-03-29 NOTE — Progress Notes (Signed)
     The patient was seen in nuclear medicine for a ETT myoview. He tolerated the procedure well. No acute ST or TW changes on ECG. He walked 8 min 15 sec on bruce protocol. He reached max HR of 144 (target 150 bpm). Test was stopped due to leg burning. Await nuclear images.   Perry Mount PA-C  [facility]

## 2015-03-29 NOTE — Progress Notes (Signed)
Pt politely refused to have bed alarm placed on overnight for safety purpose. Low Fall Risk. Will continue to monitor closely.

## 2015-03-29 NOTE — Progress Notes (Signed)
Patient Name: Alexander [name] Date of Encounter: 03/29/2015  Primary Cardiologist: Dr. Debara Pickett   Active Problems:   Atypical chest pain   Acute renal failure syndrome   Poorly controlled diabetes mellitus   Dyslipidemia   Hypertensive urgency    SUBJECTIVE  Denies any CP or SOB overnight.   CURRENT MEDS . aspirin EC  81 mg Oral Daily  . atorvastatin  40 mg Oral q1800  . carvedilol  6.25 mg Oral BID WC  . enoxaparin (LOVENOX) injection  40 mg Subcutaneous Q24H  . hydrALAZINE  50 mg Oral 3 times per day  . Influenza vac split quadrivalent PF  0.5 mL Intramuscular Tomorrow-1000  . insulin aspart  0-15 Units Subcutaneous TID WC  . isosorbide mononitrate  30 mg Oral Daily  . lisinopril  20 mg Oral Daily    OBJECTIVE  Filed Vitals:   03/28/15 1900 03/28/15 2033 03/29/15 0016 03/29/15 0418  BP: 142/71 152/87 154/68 151/87  Pulse:  81 79 85  Temp:  98 F (36.7 C) 98.3 F (36.8 C) 98 F (36.7 C)  TempSrc:  Oral Oral Oral  Resp:   18   Height:      Weight:    261 lb 0.4 oz (118.4 kg)  SpO2:  99% 96% 99%    Intake/Output Summary (Last 24 hours) at 03/29/15 0757 Last data filed at 03/29/15 0510  Gross per 24 hour  Intake   1250 ml  Output   4400 ml  Net  -3150 ml   Filed Weights   03/27/15 2042 03/28/15 0611 03/29/15 0418  Weight: 265 lb 1.6 oz (120.249 kg) 265 lb 9.6 oz (120.475 kg) 261 lb 0.4 oz (118.4 kg)    PHYSICAL EXAM  General: Pleasant, NAD. Neuro: Alert and oriented X 3. Moves all extremities spontaneously. Psych: Normal affect. HEENT:  Normal  Neck: Supple without bruits or JVD. Lungs:  Resp regular and unlabored, CTA. Heart: RRR no s3, s4, or murmurs. Abdomen: Soft, non-tender, non-distended, BS + x 4.  Extremities: No clubbing, cyanosis or edema. DP/PT/Radials 2+ and equal bilaterally.  Accessory Clinical Findings  CBC  Recent Labs  03/27/15 1656  03/28/15 0242 03/29/15 0352  WBC 8.5  < > 7.9 8.2  NEUTROABS 5.0  --   --   --   HGB  10.7*  < > 10.5* 10.6*  HCT 33.6*  < > 32.8* 33.1*  MCV 80.0  < > 80.0 79.8  PLT 244  < > 276 313  < > = values in this interval not displayed. Basic Metabolic Panel  Recent Labs  03/27/15 1656 03/27/15 2128 03/28/15 0242  NA 140  --  139  K 4.5  --  4.1  CL 106  --  108  CO2 25  --  25  GLUCOSE 118*  --  81  BUN 17  --  20  CREATININE 1.76* 1.69* 1.59*  CALCIUM 8.9  --  8.6*   Liver Function Tests  Recent Labs  03/27/15 1656  AST 38  ALT 18  ALKPHOS 63  BILITOT 1.1  PROT 7.2  ALBUMIN 3.6    Recent Labs  03/27/15 1656  LIPASE 21*   Cardiac Enzymes  Recent Labs  03/27/15 2128 03/28/15 0242 03/28/15 1012  TROPONINI 0.03 <0.03 <0.03   Fasting Lipid Panel  Recent Labs  03/28/15 0242  CHOL 200  HDL 33*  LDLCALC 151*  TRIG 81  CHOLHDL 6.1   Thyroid Function Tests  Recent Labs  03/27/15 2221  TSH 2.261    TELE NSR without significant ventricular ectopy    ECG  No new EKG  Echocardiogram 03/28/2015  LV EF: 40% -  45%  ------------------------------------------------------------------- Indications:   Dyspnea 786.09.  ------------------------------------------------------------------- Study Conclusions  - Left ventricle: The cavity size was normal. There was mild concentric hypertrophy. Systolic function was mildly to moderately reduced. The estimated ejection fraction was in the range of 40% to 45%. Diffuse hypokinesis with no identifiable regional variations. Features are consistent with a pseudonormal left ventricular filling pattern, with concomitant abnormal relaxation and increased filling pressure (grade 2 diastolic dysfunction). - Mitral valve: There was mild regurgitation. - Left atrium: The atrium was moderately dilated. - Right ventricle: The cavity size was mildly dilated. Systolic function was mildly reduced. - Right atrium: The atrium was mildly dilated.    Radiology/Studies  Dg Chest Port 1  View  03/27/2015   CLINICAL DATA:  Chest pain and shortness of breath for 2 days.  EXAM: PORTABLE CHEST - 1 VIEW  COMPARISON:  01/25/2014  FINDINGS: AP view is likely accentuating the size of the heart. Lungs are clear without airspace disease or pulmonary edema. Patient is mildly rotated towards the right. No large pleural effusions. Bony thorax is intact.  IMPRESSION: No acute chest findings.   Electronically Signed   By: Markus Daft M.D.   On: 03/27/2015 17:03    ASSESSMENT AND PLAN  Alexander [name] is a 44 y.o. male with a history of HTN, HL, DM who presented to [address] [facility] ED for evaluation of worsening SOB and CP, in the setting of hypertensive urgency, dehydration, acute on chronic renal insufficieny and elevated blood glucose over 350 mg/dL.   1. Chest pain with SOB  - serial trop negative.   - Echo 03/28/2015 EF 40-45%, diffuse hypokinesis with no identifiable regional variation, grade 2 diastolic dysfunction, mild MR.  - initially received IVF for dehydration, received 40mg  IV lasix yesterday, will check BMET today.   - Decreased EF can certainly be explained by uncontrolled HTN, however cannot r/o underlying CAD either, therefore will proceed with treadmill nuc this morning. Made NPO. Instructed nurse to hold coreg, but give all other BP meds. I ordered extra dose of hydralazine and changed starting time of his lisinopril so he will get his BP meds before going down for nuc   - expect to stay at least 1 more day for BP management even if myoview if low risk  2. Hypertensive urgency  - Currently on coreg 6.25mg  BID, hydralazine 50mg  TID, Imdur 30mg , and lisinopril 20mg  . SBP 140-160s, does have significant headache since Monday, Imdur was started on Tue, if continue to have headache may need to D/C imdur. Will increase hydralazine to 75mg  q8HR. Will increase coreg later if SBP still high  3. Acute on chronic renal insufficiency  - given 40mg  IV lasix, recheck BMET this morning  4. HL  -  07/15/2014: Cholesterol 255*; HDL 32.10*; Triglycerides 301.0*; VLDL 60.2*   - 03/28/2015 LDL 151, Chol 200, HDL 33  5. DM  - HgbA1c of 9.5 - 01/27/15. Managed by endocrinologist, Dr. Loanne Drilling.    - Will place him on SSI  6. Obesity  7. Anemia: stable  Signed, Woodward Ku Pager: [phone]  I have seen and examined the patient along with Almyra Deforest PA-C.  I have reviewed the chart, notes and new data.  I agree with PA's note.  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 Will try to avoid coronary angio due to renal problems, unless there is a clear perfusion abnormality. Gradually titrate up BP meds, preferentially using ACEi and carvedilol and "Bidil-equivalent" combo. Consider Entresto if BP still high and EF does not improve in 2-3 months. Switch to PO diuretics. Daily BMET. Possible DC tomorrow if appears euvolemic and stress test negative and renal function OK.  Sanda Klein, MD, [place] [phone] 03/29/2015, 9:56 AM

## 2015-03-30 ENCOUNTER — Other Ambulatory Visit: Payer: Self-pay | Admitting: Physician Assistant

## 2015-03-30 DIAGNOSIS — E1121 Type 2 diabetes mellitus with diabetic nephropathy: Secondary | ICD-10-CM | POA: Diagnosis not present

## 2015-03-30 DIAGNOSIS — I10 Essential (primary) hypertension: Secondary | ICD-10-CM | POA: Diagnosis not present

## 2015-03-30 DIAGNOSIS — N189 Chronic kidney disease, unspecified: Secondary | ICD-10-CM

## 2015-03-30 DIAGNOSIS — E1122 Type 2 diabetes mellitus with diabetic chronic kidney disease: Secondary | ICD-10-CM | POA: Diagnosis not present

## 2015-03-30 DIAGNOSIS — N289 Disorder of kidney and ureter, unspecified: Principal | ICD-10-CM

## 2015-03-30 DIAGNOSIS — E1165 Type 2 diabetes mellitus with hyperglycemia: Secondary | ICD-10-CM | POA: Diagnosis not present

## 2015-03-30 LAB — CBC
HEMATOCRIT: 35.9 % — AB (ref 39.0–52.0)
Hemoglobin: 11.6 g/dL — ABNORMAL LOW (ref 13.0–17.0)
MCH: 26 pg (ref 26.0–34.0)
MCHC: 32.3 g/dL (ref 30.0–36.0)
MCV: 80.5 fL (ref 78.0–100.0)
Platelets: 344 10*3/uL (ref 150–400)
RBC: 4.46 MIL/uL (ref 4.22–5.81)
RDW: 14.2 % (ref 11.5–15.5)
WBC: 9.5 10*3/uL (ref 4.0–10.5)

## 2015-03-30 LAB — GLUCOSE, CAPILLARY
Glucose-Capillary: 175 mg/dL — ABNORMAL HIGH (ref 65–99)
Glucose-Capillary: 214 mg/dL — ABNORMAL HIGH (ref 65–99)

## 2015-03-30 MED ORDER — HYDRALAZINE HCL 50 MG PO TABS
100.0000 mg | ORAL_TABLET | Freq: Three times a day (TID) | ORAL | Status: DC
  Administered 2015-03-30: 100 mg via ORAL
  Filled 2015-03-30: qty 2

## 2015-03-30 MED ORDER — ATORVASTATIN CALCIUM 40 MG PO TABS: 40.0000 mg | ORAL_TABLET | Freq: Every day | ORAL | Status: DC

## 2015-03-30 MED ORDER — ONDANSETRON HCL 4 MG PO TABS
4.0000 mg | ORAL_TABLET | Freq: Three times a day (TID) | ORAL | Status: DC | PRN
  Filled 2015-03-30: qty 1

## 2015-03-30 MED ORDER — ASPIRIN 81 MG PO TBEC: 81.0000 mg | DELAYED_RELEASE_TABLET | Freq: Every day | ORAL | Status: AC

## 2015-03-30 MED ORDER — NITROGLYCERIN 0.4 MG SL SUBL: 0.4000 mg | SUBLINGUAL_TABLET | SUBLINGUAL | Status: DC | PRN

## 2015-03-30 MED ORDER — CARVEDILOL 6.25 MG PO TABS: 6.2500 mg | ORAL_TABLET | Freq: Two times a day (BID) | ORAL | Status: DC

## 2015-03-30 MED ORDER — HYDRALAZINE HCL 100 MG PO TABS: 100.0000 mg | ORAL_TABLET | Freq: Three times a day (TID) | ORAL | Status: DC

## 2015-03-30 MED ORDER — IBUPROFEN 200 MG PO TABS
400.0000 mg | ORAL_TABLET | Freq: Four times a day (QID) | ORAL | Status: DC | PRN
  Administered 2015-03-30: 400 mg via ORAL
  Filled 2015-03-30: qty 2

## 2015-03-30 NOTE — Progress Notes (Signed)
Patient Name: Alexander [name] Date of Encounter: 03/30/2015  Primary Cardiologist: Dr. Debara Pickett   Active Problems:   Atypical chest pain   Acute renal failure syndrome   Poorly controlled diabetes mellitus   Dyslipidemia   Hypertensive urgency   Diabetic nephropathy   Type 2 diabetes mellitus with diabetic chronic kidney disease   Acute combined systolic and diastolic heart failure    SUBJECTIVE  Denies any CP or SOB overnight.   CURRENT MEDS . aspirin EC  81 mg Oral Daily  . atorvastatin  40 mg Oral q1800  . carvedilol  6.25 mg Oral BID WC  . enoxaparin (LOVENOX) injection  40 mg Subcutaneous Q24H  . hydrALAZINE  75 mg Oral 3 times per day  . insulin aspart  0-15 Units Subcutaneous TID WC  . isosorbide mononitrate  30 mg Oral Daily  . lisinopril  20 mg Oral Daily    OBJECTIVE  Filed Vitals:   03/29/15 1422 03/29/15 2033 03/30/15 0422 03/30/15 0842  BP: 155/82 148/88 166/77 137/78  Pulse: 87 91    Temp: 98 F (36.7 C) 98.8 F (37.1 C) 99.3 F (37.4 C)   TempSrc: Oral Oral Oral   Resp: 16     Height:      Weight:   257 lb 1.6 oz (116.62 kg)   SpO2: 99% 97% 97%     Intake/Output Summary (Last 24 hours) at 03/30/15 0944 Last data filed at 03/30/15 0220  Gross per 24 hour  Intake    240 ml  Output   1885 ml  Net  -1645 ml   Filed Weights   03/28/15 0611 03/29/15 0418 03/30/15 0422  Weight: 265 lb 9.6 oz (120.475 kg) 261 lb 0.4 oz (118.4 kg) 257 lb 1.6 oz (116.62 kg)    PHYSICAL EXAM  General: Pleasant, NAD. Neuro: Alert and oriented X 3. Moves all extremities spontaneously. Psych: Normal affect. HEENT:  Normal  Neck: Supple without bruits or JVD. Lungs:  Resp regular and unlabored, CTA. Heart: RRR no s3, s4, or murmurs. Abdomen: Soft, non-tender, non-distended, BS + x 4.  Extremities: No clubbing, cyanosis or edema. DP/PT/Radials 2+ and equal bilaterally.  Accessory Clinical Findings  CBC  Recent Labs  03/27/15 1656  03/29/15 0352  03/30/15 0500  WBC 8.5  < > 8.2 9.5  NEUTROABS 5.0  --   --   --   HGB 10.7*  < > 10.6* 11.6*  HCT 33.6*  < > 33.1* 35.9*  MCV 80.0  < > 79.8 80.5  PLT 244  < > 313 344  < > = values in this interval not displayed. Basic Metabolic Panel  Recent Labs  03/28/15 0242 03/29/15 0925  NA 139 140  K 4.1 4.9  CL 108 108  CO2 25 23  GLUCOSE 81 154*  BUN 20 20  CREATININE 1.59* 1.71*  CALCIUM 8.6* 9.5   Liver Function Tests  Recent Labs  03/27/15 1656  AST 38  ALT 18  ALKPHOS 63  BILITOT 1.1  PROT 7.2  ALBUMIN 3.6    Recent Labs  03/27/15 1656  LIPASE 21*   Cardiac Enzymes  Recent Labs  03/27/15 2128 03/28/15 0242 03/28/15 1012  TROPONINI 0.03 <0.03 <0.03   Fasting Lipid Panel  Recent Labs  03/28/15 0242  CHOL 200  HDL 33*  LDLCALC 151*  TRIG 81  CHOLHDL 6.1   Thyroid Function Tests  Recent Labs  03/27/15 2221  TSH 2.261    TELE NSR without  significant ventricular ectopy    ECG  No new EKG  Echocardiogram 03/28/2015  LV EF: 40% -  45%  ------------------------------------------------------------------- Indications:   Dyspnea 786.09.  ------------------------------------------------------------------- Study Conclusions  - Left ventricle: The cavity size was normal. There was mild concentric hypertrophy. Systolic function was mildly to moderately reduced. The estimated ejection fraction was in the range of 40% to 45%. Diffuse hypokinesis with no identifiable regional variations. Features are consistent with a pseudonormal left ventricular filling pattern, with concomitant abnormal relaxation and increased filling pressure (grade 2 diastolic dysfunction). - Mitral valve: There was mild regurgitation. - Left atrium: The atrium was moderately dilated. - Right ventricle: The cavity size was mildly dilated. Systolic function was mildly reduced. - Right atrium: The atrium was mildly dilated.     Radiology/Studies  Nm Myocar Multi W/spect W/wall Motion / Ef  03/29/2015    There was no ST segment deviation noted during stress.  Defect 1: There is a small defect of mild severity.  This is a high risk study.  Nuclear stress EF: 33%.   Patchy mild perfusion defects at rest and stress which do not correlate  with significant vascular ischemia. LVEF 33% with severe global  hypokinesis. This is a high-risk study due to severely reduced LV systolic  function.   Dg Chest Port 1 View  03/27/2015   CLINICAL DATA:  Chest pain and shortness of breath for 2 days.  EXAM: PORTABLE CHEST - 1 VIEW  COMPARISON:  01/25/2014  FINDINGS: AP view is likely accentuating the size of the heart. Lungs are clear without airspace disease or pulmonary edema. Patient is mildly rotated towards the right. No large pleural effusions. Bony thorax is intact.  IMPRESSION: No acute chest findings.   Electronically Signed   By: Markus Daft M.D.   On: 03/27/2015 17:03    ASSESSMENT AND PLAN  Alexander [name] is a 44 y.o. male with a history of HTN, HL, DM who presented to [facility] ED for evaluation of worsening SOB and CP, in the setting of hypertensive urgency, dehydration, acute on chronic renal insufficieny and elevated blood glucose over 350 mg/dL.   1. Chest pain with SOB  - serial trop negative.   - Echo 03/28/2015 EF 40-45%, diffuse hypokinesis with no identifiable regional variation, grade 2 diastolic dysfunction, mild MR.  - Myoview 03/30/2015 EF 33%, high risk study, no ST changes, no significant ischemia.   - discharge today, per Dr. Sallyanne Kuster, will arrange 1-2 weeks followup and continue to uptitrate his medication as outpatient. Likely will uptitrate his coreg next.   - no sign of fluid overload on exam this morning.   2. Hypertensive urgency  - Currently on coreg 6.25mg  BID, hydralazine 75mg  TID, Imdur 30mg , and lisinopril 20mg  .   - BP still high, will increase hydralazine from 75mg  TID to 100mg  TID. Plan to  increase coreg later if SBP still high. He continue to have headache, will drop Imdur for now.    3. Acute on chronic renal insufficiency  - will monitor as outpatient.  4. HL  - 07/15/2014: Cholesterol 255*; HDL 32.10*; Triglycerides 301.0*; VLDL 60.2*   - 03/28/2015 LDL 151, Chol 200, HDL 33  5. DM  - HgbA1c of 9.5 - 01/27/15. Managed by endocrinologist, Dr. Loanne Drilling.    - Will place him on SSI  6. Obesity  7. Anemia: stable  Signed, Woodward Ku Pager: [phone]  I have seen and examined the patient along with Almyra Deforest PA-C.  I  have reviewed the chart, notes and new data.  I agree with PA's note.  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  PLAN: DC home with early f/u Will need gradual increase in beta blocker and ACEi to maximum doses. DC imdur due to HA. Increase hydralazine. Discussed sodium restriction, daily weights, signs and symptoms of CHF in detail. Reevaluate LVEF in 3 months. If low EF persists, may still be worthwhile to perform coronary angio, but risk of nephrotoxicity seems to be higher than benefit at this point.  Sanda Klein, MD, [place] [phone] 03/30/2015, 11:51 AM

## 2015-03-30 NOTE — Discharge Summary (Signed)
Discharge Summary   Patient ID: Alexander [name],  MRN: [id number], DOB/AGE: 01/04/1971 44 y.o.  Admit date: 03/27/2015 Discharge date: 03/30/2015  Primary Care Provider: Vertell Novak A Primary Cardiologist: Dr. Debara Pickett  Discharge Diagnoses Principal Problem:   Atypical chest pain Active Problems:   Acute renal failure syndrome   Poorly controlled diabetes mellitus   Dyslipidemia   Hypertensive urgency   Diabetic nephropathy   Type 2 diabetes mellitus with diabetic chronic kidney disease   Acute combined systolic and diastolic heart failure   Allergies No Known Allergies  Procedures  Echocardiogram 03/28/2015 LV EF: 40% -  45%  ------------------------------------------------------------------- Indications:   Dyspnea 786.09.  ------------------------------------------------------------------- Study Conclusions  - Left ventricle: The cavity size was normal. There was mild concentric hypertrophy. Systolic function was mildly to moderately reduced. The estimated ejection fraction was in the range of 40% to 45%. Diffuse hypokinesis with no identifiable regional variations. Features are consistent with a pseudonormal left ventricular filling pattern, with concomitant abnormal relaxation and increased filling pressure (grade 2 diastolic dysfunction). - Mitral valve: There was mild regurgitation. - Left atrium: The atrium was moderately dilated. - Right ventricle: The cavity size was mildly dilated. Systolic function was mildly reduced. - Right atrium: The atrium was mildly dilated.    Myoview 03/29/2015   There was no ST segment deviation noted during stress.  Defect 1: There is a small defect of mild severity.  This is a high risk study.  Nuclear stress EF: 33%.  Patchy mild perfusion defects at rest and stress which do not correlate with significant vascular ischemia. LVEF 33% with severe global hypokinesis. This is a high-risk study due  to severely reduced LV systolic function.    Hospital Course  Alexander [name] is a pleasant 44 year old African-American male with past medical history of HTN, HL, DM who presented to [facility] on 03/27/2015 for evaluation of worsening shortness of breath and chest pain. He states, on Saturday night, he woke up with sudden onset shortness of breath that intermittently persisted throughout the day. He used his son's albuterol nebulizer twice that helped improve the breathing. In the morning of the presentation, he had a recurrent intermittent shortness of breath and some nausea. He went to his work where he had some chest tightness while sitting at the desk which lasted about 20 minutes and then started easing off. He decided to seek medical attention at [facility] He received 325 mg aspirin prior to arrival. On presentation, he still had some shortness of breath. He was started on IV heparin. He was given 3 sublingual nitroglycerin with some improvement in the symptom. Creatinine was 1.76. Hemoglobin is 10.7. Chest x-ray without acute finding. Systolic blood pressure was significantly elevated at 183/106. EKG showed normal sinus rhythm with nonspecific T-wave abnormalities in the anterior lead.  He was admitted to cardiology service for treatment of hypertensive urgency. Echocardiogram was obtained which showed EF 40-45%, diffuse hypokinesis with no identifiable regional variation, grade 2 diastolic dysfunction, mild MR. He was placed on hydralazine PO 3 times a day with some improvement in his blood pressure. Given the mildly decreased EF on echo, he underwent treadmill stress test on 03/29/2015 which came back high risk with EF 33%, no ST changes, no significant ischemia. The decreased ejection fraction was likely related to uncontrolled high blood pressure.  He was seen in the morning of 03/30/2015, he did have some nausea which was treated with Zofran. Otherwise he denies any significant  chest discomfort or  shortness breath. He appears to be euvolemic on physical exam. Repeat emphasis has been placed on aggressive control of his blood pressure. Given persistent headache, we had to take him off of Imdur. His hydralazine was increased to 100 mg 3 times a day. He will continue on Coreg 6.25 mg twice a day and lisinopril 20 mg daily. Close outpatient follow-up in 1 to 2 weeks has been arranged for him along with labs to monitor renal function. We have also discussed sodium restriction, daily weight, sinus symptom of heart failure in detail.  Discharge Vitals Blood pressure 137/78, pulse 91, temperature 99.3 F (37.4 C), temperature source Oral, resp. rate 16, height 6\' 3"  (1.905 m), weight 257 lb 1.6 oz (116.62 kg), SpO2 97 %.  Filed Weights   03/28/15 0611 03/29/15 0418 03/30/15 0422  Weight: 265 lb 9.6 oz (120.475 kg) 261 lb 0.4 oz (118.4 kg) 257 lb 1.6 oz (116.62 kg)    Labs  CBC  Recent Labs  03/27/15 1656  03/29/15 0352 03/30/15 0500  WBC 8.5  < > 8.2 9.5  NEUTROABS 5.0  --   --   --   HGB 10.7*  < > 10.6* 11.6*  HCT 33.6*  < > 33.1* 35.9*  MCV 80.0  < > 79.8 80.5  PLT 244  < > 313 344  < > = values in this interval not displayed. Basic Metabolic Panel  Recent Labs  03/28/15 0242 03/29/15 0925  NA 139 140  K 4.1 4.9  CL 108 108  CO2 25 23  GLUCOSE 81 154*  BUN 20 20  CREATININE 1.59* 1.71*  CALCIUM 8.6* 9.5   Liver Function Tests  Recent Labs  03/27/15 1656  AST 38  ALT 18  ALKPHOS 63  BILITOT 1.1  PROT 7.2  ALBUMIN 3.6    Recent Labs  03/27/15 1656  LIPASE 21*   Cardiac Enzymes  Recent Labs  03/27/15 2128 03/28/15 0242 03/28/15 1012  TROPONINI 0.03 <0.03 <0.03   Fasting Lipid Panel  Recent Labs  03/28/15 0242  CHOL 200  HDL 33*  LDLCALC 151*  TRIG 81  CHOLHDL 6.1   Thyroid Function Tests  Recent Labs  03/27/15 2221  TSH 2.261    Disposition  Pt is being discharged home today in good condition.  Follow-up  Plans & Appointments      Follow-up Information    Follow up with [name], PA-C On 04/12/2015.   Specialties:  Cardiology, Radiology   Why:  2:00pm. Cardiology followup   Contact information:   [address] [place] [address] [phone]       Follow up with [facility] On 04/06/2015.   Why:  Obtain BMET lab in 1 week after discharge to monitor kindey function.   Contact information:   [address] [place] SSN-[id number]      Discharge Medications    Medication List    STOP taking these medications        cyclobenzaprine 10 MG tablet  Commonly known as:  FLEXERIL     Folic Acid 7.5 MG Tabs     glucose blood test strip  Commonly known as:  FREESTYLE LITE     ibuprofen 600 MG tablet  Commonly known as:  ADVIL,MOTRIN      TAKE these medications        aspirin 81 MG EC tablet  Take 1 tablet (81 mg total) by mouth daily.     atorvastatin  40 MG tablet  Commonly known as:  LIPITOR  Take 1 tablet (40 mg total) by mouth daily at 6 PM.     carvedilol 6.25 MG tablet  Commonly known as:  COREG  Take 1 tablet (6.25 mg total) by mouth 2 (two) times daily with a meal.     hydrALAZINE 100 MG tablet  Commonly known as:  APRESOLINE  Take 1 tablet (100 mg total) by mouth every 8 (eight) hours.     insulin detemir 100 UNIT/ML injection  Commonly known as:  LEVEMIR  Inject 2 mLs (200 Units total) into the skin every morning.     lisinopril 20 MG tablet  Commonly known as:  PRINIVIL,ZESTRIL  Take 1 tablet (20 mg total) by mouth daily.     nitroGLYCERIN 0.4 MG SL tablet  Commonly known as:  NITROSTAT  Place 1 tablet (0.4 mg total) under the tongue every 5 (five) minutes x 3 doses as needed for chest pain. Don't take within 24 hrs of Cialis     tadalafil 20 MG tablet  Commonly known as:  CIALIS  Take 1 tablet (20 mg total) by mouth daily as needed for erectile dysfunction.         Duration of  Discharge Encounter   Greater than 30 minutes including physician time.  Hilbert Corrigan PA-C Pager: [phone] 03/30/2015, 4:29 PM

## 2015-03-31 ENCOUNTER — Telehealth: Payer: Self-pay | Admitting: Internal Medicine

## 2015-03-31 ENCOUNTER — Ambulatory Visit: Payer: BLUE CROSS/BLUE SHIELD | Admitting: Internal Medicine

## 2015-03-31 NOTE — Telephone Encounter (Signed)
TOC Phone Call .Marland Kitchen Appt is on 04/12/15 at 2pm w/ [name] at the [facility]

## 2015-04-03 NOTE — Telephone Encounter (Signed)
TOC call to patient no answer.LMTC. °

## 2015-04-05 NOTE — Telephone Encounter (Signed)
[facility] contacted regarding [facility] .  Patient understands to follow up with provider BARRETT on 04/12/15 at 2 PM at [facility]. Patient understands discharge instructions? yes Patient understands medications and regiment? yes Patient understands to bring all medications to this visit? yes

## 2015-04-07 ENCOUNTER — Telehealth: Payer: Self-pay | Admitting: Internal Medicine

## 2015-04-07 ENCOUNTER — Other Ambulatory Visit: Payer: Self-pay | Admitting: Physician Assistant

## 2015-04-07 NOTE — Telephone Encounter (Signed)
Lab order for BMET in system  Printed out requisition and sent to [facility] :lab [facility]

## 2015-04-08 LAB — BASIC METABOLIC PANEL
BUN: 31 mg/dL — ABNORMAL HIGH (ref 7–25)
CALCIUM: 9.1 mg/dL (ref 8.6–10.3)
CO2: 23 mmol/L (ref 20–31)
Chloride: 100 mmol/L (ref 98–110)
Creat: 2.04 mg/dL — ABNORMAL HIGH (ref 0.60–1.35)
Glucose, Bld: 106 mg/dL — ABNORMAL HIGH (ref 65–99)
POTASSIUM: 4.3 mmol/L (ref 3.5–5.3)
SODIUM: 136 mmol/L (ref 135–146)

## 2015-04-11 NOTE — Progress Notes (Signed)
Cardiology Office Note   Date:  04/12/2015   ID:  Alexander [name], DOB Jun 01, 1971, MRN [id number]  PCP:  Hoyt Koch, MD  Cardiologist:  Dr Marchelle Folks, PA-C   Chief Complaint  Patient presents with  . Hospitalization Follow-up    patient reports fatigue, lightheadedness - r/t hydralazine and coreg - it has eased up, shortness of breath, 1 episode of chest pain - took 1 NTG with relief    History of Present Illness: Alexander [name] is a 44 y.o. male with a history of DM, HL, HTN, d/c 09/22 after hospitalization for atyp CP, hypertensive urgency, EF 40-45% by echo w/ gr 2 DD, MV w/ small defect EF 33%. ARF on CKD  Alexander [name] presents for post-hospital evaluation.  Since discharge, he feels he has done very well. He had one brief episode of chest pain which resolved with one sublingual nitroglycerin. His shortness of breath has improved.   He has been checking his blood pressure at work occasionally, and it is likely improved, but still as high as 0000000 systolic at times. He is tracking his weight and his weight is down 4 pounds since discharge.   He has been tracking his blood sugars as well and in the evenings, he has had some low blood sugars, as low as 66. His morning blood sugars tend to be higher and this morning it was 253.  He is compliant with his medications and he and his wife are eating a much healthier diet. He has no lower extremity edema, no orthopnea, and no PND.   Past Medical History  Diagnosis Date  . DIABETES MELLITUS, TYPE II 03/20/2007  . HYPERCHOLESTEROLEMIA 07/21/2008  . HYPERTENSION 03/20/2007  . CHEST PAIN 08/28/2009    Past Surgical History  Procedure Laterality Date  . Knee arthroscopy Right     Current Outpatient Prescriptions  Medication Sig Dispense Refill  . aspirin EC 81 MG EC tablet Take 1 tablet (81 mg total) by mouth daily.    Marland Kitchen atorvastatin (LIPITOR) 40 MG tablet Take 1 tablet (40 mg total) by mouth daily at  6 PM. 90 tablet 3  . carvedilol (COREG) 6.25 MG tablet Take 1 tablet (6.25 mg total) by mouth 2 (two) times daily with a meal. 60 tablet 5  . hydrALAZINE (APRESOLINE) 100 MG tablet Take 1 tablet (100 mg total) by mouth every 8 (eight) hours. 90 tablet 5  . insulin detemir (LEVEMIR) 100 UNIT/ML injection Inject 2 mLs (200 Units total) into the skin every morning. (Patient taking differently: Inject 200 Units into the skin every morning. Patient states he is taking 200 units daily per patient) 70 mL 11  . lisinopril (PRINIVIL,ZESTRIL) 20 MG tablet Take 1 tablet (20 mg total) by mouth daily. 90 tablet 3  . nitroGLYCERIN (NITROSTAT) 0.4 MG SL tablet Place 1 tablet (0.4 mg total) under the tongue every 5 (five) minutes x 3 doses as needed for chest pain. Don't take within 24 hrs of Cialis 25 tablet 3  . tadalafil (CIALIS) 20 MG tablet Take 1 tablet (20 mg total) by mouth daily as needed for erectile dysfunction. 10 tablet 11   No current facility-administered medications for this visit.    Allergies:   Review of patient's allergies indicates no known allergies.    Social History:  The patient  reports that he has never smoked. He has never used smokeless tobacco. He reports that he does not drink alcohol or use illicit drugs.  Family History:  The patient's family history includes Diabetes in his brother, father, maternal grandfather, mother, and paternal grandfather; Hypertension in his brother and father.    ROS:  Please see the history of present illness. All other systems are reviewed and negative.    PHYSICAL EXAM: VS:  BP 130/72 mmHg  Pulse 82  Ht 6\' 3"  (1.905 m)  Wt 261 lb 11.2 oz (118.706 kg)  BMI 32.71 kg/m2 , BMI Body mass index is 32.71 kg/(m^2). GEN: Well nourished, well developed, in no acute distress HEENT: normal Neck: no JVD, carotid bruits, or masses Cardiac: RRR; no murmurs, rubs, or gallops,no edema  Respiratory:  clear to auscultation bilaterally, normal work of  breathing GI: soft, nontender, nondistended, + BS MS: no deformity or atrophy Skin: warm and dry, no rash Neuro:  Strength and sensation are intact Psych: euthymic mood, full affect   EKG:  EKG is ordered today. The ekg ordered today demonstrates sinus rhythm with nonspecific T-wave changes, unchanged from previous ECGs   Recent Labs: 03/27/2015: ALT 18; B Natriuretic Peptide 186.7*; TSH 2.261 03/30/2015: Hemoglobin 11.6*; Platelets 344 04/07/2015: BUN 31*; Creat 2.04*; Potassium 4.3; Sodium 136    Lipid Panel    Component Value Date/Time   CHOL 200 03/28/2015 0242   TRIG 81 03/28/2015 0242   HDL 33* 03/28/2015 0242   CHOLHDL 6.1 03/28/2015 0242   VLDL 16 03/28/2015 0242   LDLCALC 151* 03/28/2015 0242   LDLDIRECT 164.0 07/15/2014 1703     Wt Readings from Last 3 Encounters:  04/12/15 261 lb 11.2 oz (118.706 kg)  03/30/15 257 lb 1.6 oz (116.62 kg)  01/27/15 270 lb (122.471 kg)     Other studies Reviewed: Additional studies/ records that were reviewed today include: Hospital records, old ECGs.  ASSESSMENT AND PLAN:  1.  Left ventricular dysfunction: This is felt to be secondary to hypertensive heart disease, a nonischemic cardiomyopathy. He is having no symptoms of volume overload. He is successfully increasing his activity and is doing well. He is compliant with his medications. We will recheck an echocardiogram in 3 months.  2. Hypertensive urgency: His blood pressure is greatly improved but is still high at times. He hopes to have a home blood pressure cuff soon. Because of his renal function, we will discontinue the lisinopril and increase the carvedilol. He is to follow his blood pressures at home, and at work, and let us know what they're running.  3. Renal insufficiency: I discharged from the hospital, his BUN was 20 with a creatinine of 1.71. He is not on a diuretic. He had his it checked on 09/30, and his BUN was up to 31 with a creatinine of 2.04. The cause is  unclear but he is not on a diuretic and had no acute illnesses. Therefore, I will stop the lisinopril and he can get a BMET in 2 weeks to follow this.  3. Diabetes: He is compliant with his medications and has an appointment with his endocrinologist later this month. I requested that he contact the endocrinologist and let him know about the low blood sugars at night. Because he he is living and healthy lifestyle, he may need some diabetic medication changes.  Current medicines are reviewed at length with the patient today.  The patient does not have concerns regarding medicines.  The following changes have been made:  Stop the lisinopril, increase the carvedilol  Labs/ tests ordered today include:   Orders Placed This Encounter  Procedures  . EKG 12-Lead  .  ECHOCARDIOGRAM COMPLETE     Disposition:   FU with Dr. Debara Pickett in 3 months with an echo.  Augusto Garbe  04/12/2015 4:53 PM    [facility] [address], [place], [place]  [address] Phone: [phone]; Fax: [phone]

## 2015-04-12 ENCOUNTER — Ambulatory Visit (INDEPENDENT_AMBULATORY_CARE_PROVIDER_SITE_OTHER): Payer: BLUE CROSS/BLUE SHIELD | Admitting: Physician Assistant

## 2015-04-12 ENCOUNTER — Encounter: Payer: Self-pay | Admitting: Physician Assistant

## 2015-04-12 VITALS — BP 130/72 | HR 82 | Ht 75.0 in | Wt 261.7 lb

## 2015-04-12 DIAGNOSIS — I519 Heart disease, unspecified: Secondary | ICD-10-CM | POA: Diagnosis not present

## 2015-04-12 DIAGNOSIS — I119 Hypertensive heart disease without heart failure: Secondary | ICD-10-CM

## 2015-04-12 DIAGNOSIS — I10 Essential (primary) hypertension: Secondary | ICD-10-CM

## 2015-04-12 MED ORDER — CARVEDILOL 12.5 MG PO TABS: 12.5000 mg | ORAL_TABLET | Freq: Two times a day (BID) | ORAL | Status: DC

## 2015-04-12 MED ORDER — CARVEDILOL 6.25 MG PO TABS
ORAL_TABLET | ORAL | Status: DC
Start: 2015-04-12 — End: 2015-04-12

## 2015-04-12 NOTE — Patient Instructions (Addendum)
Your physician wants you to follow-up in: 3 Months with Dr Debara Pickett. You will receive a reminder letter in the mail two months in advance. If you don't receive a letter, please call our office to schedule the follow-up appointment.   Your physician has requested that you have an echocardiogram in 3 Months. Echocardiography is a painless test that uses sound waves to create images of your heart. It provides your doctor with information about the size and shape of your heart and how well your heart's chambers and valves are working. This procedure takes approximately one hour. There are no restrictions for this procedure.  Your physician has recommended you make the following change in your medication: Increase Carvedilol 12.5 mg twice a day and HOLD Lisinopril until further notice

## 2015-04-12 NOTE — Addendum Note (Signed)
Addended by: Vennie Homans on: 04/12/2015 05:07 PM   Modules accepted: Orders

## 2015-04-21 ENCOUNTER — Other Ambulatory Visit (INDEPENDENT_AMBULATORY_CARE_PROVIDER_SITE_OTHER): Payer: BLUE CROSS/BLUE SHIELD

## 2015-04-21 ENCOUNTER — Ambulatory Visit (INDEPENDENT_AMBULATORY_CARE_PROVIDER_SITE_OTHER): Payer: BLUE CROSS/BLUE SHIELD | Admitting: Internal Medicine

## 2015-04-21 ENCOUNTER — Encounter: Payer: Self-pay | Admitting: Internal Medicine

## 2015-04-21 VITALS — BP 142/78 | HR 81 | Temp 98.7°F | Resp 14 | Ht 75.0 in | Wt 256.8 lb

## 2015-04-21 DIAGNOSIS — E1165 Type 2 diabetes mellitus with hyperglycemia: Secondary | ICD-10-CM

## 2015-04-21 DIAGNOSIS — I10 Essential (primary) hypertension: Secondary | ICD-10-CM

## 2015-04-21 DIAGNOSIS — IMO0002 Reserved for concepts with insufficient information to code with codable children: Secondary | ICD-10-CM

## 2015-04-21 DIAGNOSIS — E118 Type 2 diabetes mellitus with unspecified complications: Secondary | ICD-10-CM

## 2015-04-21 DIAGNOSIS — N179 Acute kidney failure, unspecified: Secondary | ICD-10-CM

## 2015-04-21 DIAGNOSIS — Z794 Long term (current) use of insulin: Secondary | ICD-10-CM

## 2015-04-21 DIAGNOSIS — Z23 Encounter for immunization: Secondary | ICD-10-CM | POA: Diagnosis not present

## 2015-04-21 DIAGNOSIS — E785 Hyperlipidemia, unspecified: Secondary | ICD-10-CM

## 2015-04-21 LAB — COMPREHENSIVE METABOLIC PANEL
ALK PHOS: 67 U/L (ref 39–117)
ALT: 16 U/L (ref 0–53)
AST: 17 U/L (ref 0–37)
Albumin: 4 g/dL (ref 3.5–5.2)
BILIRUBIN TOTAL: 0.6 mg/dL (ref 0.2–1.2)
BUN: 26 mg/dL — ABNORMAL HIGH (ref 6–23)
CALCIUM: 9.4 mg/dL (ref 8.4–10.5)
CO2: 26 mEq/L (ref 19–32)
Chloride: 104 mEq/L (ref 96–112)
Creatinine, Ser: 1.81 mg/dL — ABNORMAL HIGH (ref 0.40–1.50)
GFR: 52.8 mL/min — AB (ref >60.00)
Glucose, Bld: 123 mg/dL — ABNORMAL HIGH (ref 70–99)
Potassium: 4.2 mEq/L (ref 3.5–5.1)
Sodium: 139 mEq/L (ref 135–145)
TOTAL PROTEIN: 7.9 g/dL (ref 6.0–8.3)

## 2015-04-21 LAB — LIPID PANEL
Cholesterol: 107 mg/dL (ref 0–200)
HDL: 32.9 mg/dL — ABNORMAL LOW (ref >39.00)
LDL Cholesterol: 61 mg/dL (ref 0–99)
NonHDL: 74.47
Total CHOL/HDL Ratio: 3
Triglycerides: 69 mg/dL (ref 0.0–149.0)
VLDL: 13.8 mg/dL (ref 0.0–40.0)

## 2015-04-21 LAB — TSH: TSH: 1.08 u[IU]/mL (ref 0.35–4.50)

## 2015-04-21 LAB — HEMOGLOBIN A1C: Hgb A1c MFr Bld: 6.8 % — ABNORMAL HIGH (ref 4.6–6.5)

## 2015-04-21 LAB — VITAMIN B12: Vitamin B-12: 396 pg/mL (ref 211–911)

## 2015-04-21 LAB — FOLATE: FOLATE: 16.2 ng/mL (ref >5.9)

## 2015-04-21 NOTE — Assessment & Plan Note (Signed)
LDL markedly above goal at last check so rechecking today. Currently taking lipitor 40 mg daily, adjust as needed. No side effects.

## 2015-04-21 NOTE — Progress Notes (Signed)
   Subjective:    Patient ID: Alexander [name], male    DOB: September 19, 1970, 44 y.o.   MRN: [id number]  HPI The patient is a 44 YO man coming in for follow up of his blood pressure. He has been in the hospital since the last visit for chest pains. He followed up with his cardiologist and they stopped his ACE-I due to decreased kidney function without other good explanation. He denies chest pains since leaving the hospital. Denies fevers or chills. Denies SOB. He denies low sugars (told him cardiologist about them 1-2 weeks ago). Sugar this morning was 120 and has been a little more stable. Has not changed his insulin dosing (200 units). Working on exercising more and is working on losing some weight.   Review of Systems  Constitutional: Negative for fever, activity change, appetite change, fatigue and unexpected weight change.  HENT: Negative.   Eyes: Negative.   Respiratory: Negative for cough, chest tightness, shortness of breath and wheezing.   Cardiovascular: Negative for chest pain, palpitations and leg swelling.  Gastrointestinal: Negative for nausea, abdominal pain, diarrhea, constipation and abdominal distention.  Musculoskeletal: Negative.   Skin: Negative.   Neurological: Positive for numbness. Negative for dizziness, speech difficulty, weakness, light-headedness and headaches.       Chronic, stable  Psychiatric/Behavioral: Negative.       Objective:   Physical Exam  Constitutional: He is oriented to person, place, and time. He appears well-developed and well-nourished.  Overweight  HENT:  Head: Normocephalic and atraumatic.  Eyes: EOM are normal.  Neck: Normal range of motion.  Cardiovascular: Normal rate and regular rhythm.   Pulmonary/Chest: Effort normal and breath sounds normal. No respiratory distress. He has no wheezes. He has no rales.  Abdominal: Soft. Bowel sounds are normal. He exhibits no distension. There is no tenderness.  Musculoskeletal: He exhibits no edema.    Neurological: He is alert and oriented to person, place, and time. Coordination normal.  Skin: Skin is warm and dry.  Psychiatric: He has a normal mood and affect.   Filed Vitals:   04/21/15 0831  BP: 150/90  Pulse: 81  Temp: 98.7 F (37.1 C)  TempSrc: Oral  Resp: 14  Height: 6\' 3"  (1.905 m)  Weight: 256 lb 12.8 oz (116.484 kg)  SpO2: 98%      Assessment & Plan:  Tdap and pneumonia 23 given at visit.

## 2015-04-21 NOTE — Progress Notes (Signed)
Pre visit review using our clinic review tool, if applicable. No additional management support is needed unless otherwise documented below in the visit note. 

## 2015-04-21 NOTE — Assessment & Plan Note (Signed)
He is taking hydralazine and coreg and BP mildly elevated today. Recheck was improved. He is current off his ACE-I which is why he is above goal. If he needs to be off ACE-I then will need replacement for his BP.

## 2015-04-21 NOTE — Patient Instructions (Signed)
We are checking the blood work today and will call you back with the results. We will forward the kidney function to the heart doctor.   You can try zyrtec or flonase over the counter for the allergies and those should go away on their own.   Diabetes and Exercise Exercising regularly is important. It is not just about losing weight. It has many health benefits, such as:  Improving your overall fitness, flexibility, and endurance.  Increasing your bone density.  Helping with weight control.  Decreasing your body fat.  Increasing your muscle strength.  Reducing stress and tension.  Improving your overall health. People with diabetes who exercise gain additional benefits because exercise:  Reduces appetite.  Improves the body's use of blood sugar (glucose).  Helps lower or control blood glucose.  Decreases blood pressure.  Helps control blood lipids (such as cholesterol and triglycerides).  Improves the body's use of the hormone insulin by:  Increasing the body's insulin sensitivity.  Reducing the body's insulin needs.  Decreases the risk for heart disease because exercising:  Lowers cholesterol and triglycerides levels.  Increases the levels of good cholesterol (such as high-density lipoproteins [HDL]) in the body.  Lowers blood glucose levels. YOUR ACTIVITY PLAN  Choose an activity that you enjoy, and set realistic goals. To exercise safely, you should begin practicing any new physical activity slowly, and gradually increase the intensity of the exercise over time. Your health care provider or diabetes educator can help create an activity plan that works for you. General recommendations include:  Encouraging children to engage in at least 60 minutes of physical activity each day.  Stretching and performing strength training exercises, such as yoga or weight lifting, at least 2 times per week.  Performing a total of at least 150 minutes of moderate-intensity  exercise each week, such as brisk walking or water aerobics.  Exercising at least 3 days per week, making sure you allow no more than 2 consecutive days to pass without exercising.  Avoiding long periods of inactivity (90 minutes or more). When you have to spend an extended period of time sitting down, take frequent breaks to walk or stretch. RECOMMENDATIONS FOR EXERCISING WITH TYPE 1 OR TYPE 2 DIABETES   Check your blood glucose before exercising. If blood glucose levels are greater than 240 mg/dL, check for urine ketones. Do not exercise if ketones are present.  Avoid injecting insulin into areas of the body that are going to be exercised. For example, avoid injecting insulin into:  The arms when playing tennis.  The legs when jogging.  Keep a record of:  Food intake before and after you exercise.  Expected peak times of insulin action.  Blood glucose levels before and after you exercise.  The type and amount of exercise you have done.  Review your records with your health care provider. Your health care provider will help you to develop guidelines for adjusting food intake and insulin amounts before and after exercising.  If you take insulin or oral hypoglycemic agents, watch for signs and symptoms of hypoglycemia. They include:  Dizziness.  Shaking.  Sweating.  Chills.  Confusion.  Drink plenty of water while you exercise to prevent dehydration or heat stroke. Body water is lost during exercise and must be replaced.  Talk to your health care provider before starting an exercise program to make sure it is safe for you. Remember, almost any type of activity is better than none.   This information is not intended to  replace advice given to you by your health care provider. Make sure you discuss any questions you have with your health care provider.   Document Released: 09/14/2003 Document Revised: 11/08/2014 Document Reviewed: 12/01/2012 Elsevier Interactive Patient  Education [employer].

## 2015-04-21 NOTE — Assessment & Plan Note (Signed)
ACE-I stopped 1-2 weeks ago, rechecking BMP today and will also forward to cardiology. Given his diabetes and heart failure he should be on ACE-I if able. Concern for progression of his kidney disease.

## 2015-04-21 NOTE — Assessment & Plan Note (Addendum)
Checking HgA1c today (due to see endo next week). Denies low sugars today (had 1-2 since hospitalization). Working on exercising more and given information today about how exercise affects his sugars especially since he is on insulin. Complicated by neuropathy and nephropathy (which may be worsening). Currently off ACE-I and rechecking kidney function today.

## 2015-05-01 ENCOUNTER — Ambulatory Visit (INDEPENDENT_AMBULATORY_CARE_PROVIDER_SITE_OTHER): Payer: BLUE CROSS/BLUE SHIELD | Admitting: Endocrinology

## 2015-05-01 ENCOUNTER — Encounter: Payer: Self-pay | Admitting: Endocrinology

## 2015-05-01 VITALS — BP 132/87 | HR 78 | Temp 98.3°F | Ht 75.0 in | Wt 258.0 lb

## 2015-05-01 DIAGNOSIS — E1122 Type 2 diabetes mellitus with diabetic chronic kidney disease: Secondary | ICD-10-CM | POA: Diagnosis not present

## 2015-05-01 DIAGNOSIS — N181 Chronic kidney disease, stage 1: Secondary | ICD-10-CM

## 2015-05-01 DIAGNOSIS — E1039 Type 1 diabetes mellitus with other diabetic ophthalmic complication: Secondary | ICD-10-CM | POA: Diagnosis not present

## 2015-05-01 DIAGNOSIS — IMO0002 Reserved for concepts with insufficient information to code with codable children: Secondary | ICD-10-CM

## 2015-05-01 DIAGNOSIS — Z794 Long term (current) use of insulin: Secondary | ICD-10-CM

## 2015-05-01 DIAGNOSIS — E1065 Type 1 diabetes mellitus with hyperglycemia: Principal | ICD-10-CM

## 2015-05-01 MED ORDER — INSULIN DETEMIR 100 UNIT/ML ~~LOC~~ SOLN: 180.0000 [IU] | SUBCUTANEOUS | Status: DC

## 2015-05-01 NOTE — Progress Notes (Signed)
Subjective:    Patient ID: Alexander [name], male    DOB: 1970-09-30, 44 y.o.   MRN: [id number]  HPI Pt returns for f/u of diabetes mellitus: DM type: insulin-requiring type 2 Dx'ed: [id number] Complications: polyneuropathy, renal insufficiency, and retinopathy.   Therapy: insulin since 2013.   DKA: never Severe hypoglycemia: never Pancreatitis: never Other: he has requested qd insulin.  He declines weight loss surgery.   Interval history: he brings a record of his cbg's which i have reviewed today.  It varies from 43-200's.  There is no trend throughout the day.  pt states he feels well in general.  He says he never misses the insulin.   Past Medical History  Diagnosis Date  . DIABETES MELLITUS, TYPE II 03/20/2007  . HYPERCHOLESTEROLEMIA 07/21/2008  . HYPERTENSION 03/20/2007  . CHEST PAIN 08/28/2009  . Hypertensive urgency 03/27/2015  . Left ventricular dysfunction 03/28/2015    EF 40-45%, no WMA, grade 2 diastolic dysfunction    Past Surgical History  Procedure Laterality Date  . Knee arthroscopy Right     Social History   Social History  . Marital Status: Married    Spouse Name: N/A  . Number of Children: N/A  . Years of Education: N/A   Occupational History  . [employer]   Social History Main Topics  . Smoking status: Never Smoker   . Smokeless tobacco: Never Used  . Alcohol Use: No  . Drug Use: No  . Sexual Activity: Not on file   Other Topics Concern  . Not on file   Social History Narrative    Current Outpatient Prescriptions on File Prior to Visit  Medication Sig Dispense Refill  . aspirin EC 81 MG EC tablet Take 1 tablet (81 mg total) by mouth daily.    Marland Kitchen atorvastatin (LIPITOR) 40 MG tablet Take 1 tablet (40 mg total) by mouth daily at 6 PM. 90 tablet 3  . carvedilol (COREG) 12.5 MG tablet Take 1 tablet (12.5 mg total) by mouth 2 (two) times daily. 60 tablet 9  . hydrALAZINE (APRESOLINE) 100 MG tablet Take 1 tablet (100 mg total) by mouth every 8  (eight) hours. 90 tablet 5  . lisinopril (PRINIVIL,ZESTRIL) 20 MG tablet Take 1 tablet (20 mg total) by mouth daily. 90 tablet 3  . nitroGLYCERIN (NITROSTAT) 0.4 MG SL tablet Place 1 tablet (0.4 mg total) under the tongue every 5 (five) minutes x 3 doses as needed for chest pain. Don't take within 24 hrs of Cialis 25 tablet 3  . tadalafil (CIALIS) 20 MG tablet Take 1 tablet (20 mg total) by mouth daily as needed for erectile dysfunction. 10 tablet 11   No current facility-administered medications on file prior to visit.    No Known Allergies  Family History  Problem Relation Age of Onset  . Diabetes Mother   . Diabetes Father   . Hypertension Father   . Diabetes Brother   . Hypertension Brother   . Diabetes Maternal Grandfather   . Diabetes Paternal Grandfather     BP 132/87 mmHg  Pulse 78  Temp(Src) 98.3 F (36.8 C) (Oral)  Ht 6\' 3"  (1.905 m)  Wt 258 lb (117.028 kg)  BMI 32.25 kg/m2  SpO2 98%    Review of Systems Denies LOC.  He has lost weight, due to his efforts.    Objective:   Physical Exam VITAL SIGNS:  See vs page GENERAL: no distress SKIN:  Insulin injection sites at the anterior abdomen are normal.  Lab Results  Component Value Date   HGBA1C 6.8* 04/21/2015      Assessment & Plan:  DM: overcontrolled, given this regimen, which does match insulin to his changing needs throughout the day.  Patient is advised the following: Patient Instructions  check your blood sugar 2 times a day.  vary the time of day when you check, between before the 3 meals, and at bedtime.  also check if you have symptoms of your blood sugar being too high or too low.  please keep a record of the readings and bring it to your next appointment here.  please call us sooner if your blood sugar goes below 70, or if you have a lot of readings over 200.   Please come back for a follow-up appointment in 4 months.   Please reduce the levemir to 180 units each morning.   On this type of  insulin schedule, you should eat meals on a regular schedule.  If a meal is missed or significantly delayed, your blood sugar could go low.

## 2015-05-01 NOTE — Patient Instructions (Addendum)
check your blood sugar 2 times a day.  vary the time of day when you check, between before the 3 meals, and at bedtime.  also check if you have symptoms of your blood sugar being too high or too low.  please keep a record of the readings and bring it to your next appointment here.  please call us sooner if your blood sugar goes below 70, or if you have a lot of readings over 200.   Please come back for a follow-up appointment in 4 months.   Please reduce the levemir to 180 units each morning.   On this type of insulin schedule, you should eat meals on a regular schedule.  If a meal is missed or significantly delayed, your blood sugar could go low.

## 2015-05-02 ENCOUNTER — Telehealth: Payer: Self-pay | Admitting: Endocrinology

## 2015-05-02 MED ORDER — INSULIN DETEMIR 100 UNIT/ML ~~LOC~~ SOLN: 180.0000 [IU] | SUBCUTANEOUS | Status: DC

## 2015-05-02 NOTE — Telephone Encounter (Signed)
I contacted the pt and verified if he is currently using the vials or pens and he stated his is using the Levemir vials at this time. Pharmacy notified to dispense vials.

## 2015-05-02 NOTE — Telephone Encounter (Signed)
Patient Name: Alexander [name] Gender: Male DOB: May 28, 1971 Age: 44 Y 56 M 23 D Return Phone Number: Address: City/State/Zip: [place] [employer] Endocrinology Night - Client Client Site [facility] Physician Renato Shin Contact Type Call Call Type Triage / Clinical Caller Name Darnelle Maffucci at [facility] CB# [phone] Relationship To Patient Other Return Phone Number Please choose phone number Chief Complaint Paging or Request for Consult Initial Comment Caller states he is Darnelle Maffucci with [facility]. Needs to verify a prescription for insulin. CB# [phone] Nurse Assessment Nurse: Jimmye Norman, RN, Museum/gallery conservator Date/Time (Eastern Time): 05/01/2015 8:49:18 PM Please select the assessment type ---Pharmacy clarification Additional Documentation ---caller is Beverely Low from [place] [facility], he wants to know if the patient is getting the flex pen this time. He usually gets vials but he is not sure. The notes at the end it says and pen needles- two per day. It is for levemir. He just wants to send the information to the office for them to see tomorrow. They are about to close for the night. Is there an on-call physician for the client? ---Yes Do the client directives allow paging the on call for medication concerns? ---No Additional Documentation ---Caller states he will call the office in the morning as well.

## 2015-06-29 ENCOUNTER — Other Ambulatory Visit: Payer: Self-pay | Admitting: Endocrinology

## 2015-07-13 ENCOUNTER — Other Ambulatory Visit: Payer: Self-pay

## 2015-07-13 ENCOUNTER — Ambulatory Visit (HOSPITAL_COMMUNITY): Payer: BLUE CROSS/BLUE SHIELD | Attending: Cardiovascular Disease

## 2015-07-13 DIAGNOSIS — E119 Type 2 diabetes mellitus without complications: Secondary | ICD-10-CM | POA: Diagnosis not present

## 2015-07-13 DIAGNOSIS — E785 Hyperlipidemia, unspecified: Secondary | ICD-10-CM | POA: Diagnosis not present

## 2015-07-13 DIAGNOSIS — I517 Cardiomegaly: Secondary | ICD-10-CM | POA: Insufficient documentation

## 2015-07-13 DIAGNOSIS — N189 Chronic kidney disease, unspecified: Secondary | ICD-10-CM | POA: Diagnosis not present

## 2015-07-13 DIAGNOSIS — I129 Hypertensive chronic kidney disease with stage 1 through stage 4 chronic kidney disease, or unspecified chronic kidney disease: Secondary | ICD-10-CM | POA: Insufficient documentation

## 2015-07-13 DIAGNOSIS — I519 Heart disease, unspecified: Secondary | ICD-10-CM | POA: Diagnosis present

## 2015-07-20 ENCOUNTER — Encounter: Payer: Self-pay | Admitting: Internal Medicine

## 2015-07-20 ENCOUNTER — Ambulatory Visit (INDEPENDENT_AMBULATORY_CARE_PROVIDER_SITE_OTHER): Payer: BLUE CROSS/BLUE SHIELD | Admitting: Internal Medicine

## 2015-07-20 VITALS — BP 122/76 | HR 83 | Ht 75.0 in | Wt 245.4 lb

## 2015-07-20 DIAGNOSIS — E1165 Type 2 diabetes mellitus with hyperglycemia: Secondary | ICD-10-CM

## 2015-07-20 DIAGNOSIS — I10 Essential (primary) hypertension: Secondary | ICD-10-CM

## 2015-07-20 DIAGNOSIS — R0989 Other specified symptoms and signs involving the circulatory and respiratory systems: Secondary | ICD-10-CM

## 2015-07-20 DIAGNOSIS — I110 Hypertensive heart disease with heart failure: Secondary | ICD-10-CM

## 2015-07-20 DIAGNOSIS — N179 Acute kidney failure, unspecified: Secondary | ICD-10-CM

## 2015-07-20 DIAGNOSIS — E0821 Diabetes mellitus due to underlying condition with diabetic nephropathy: Secondary | ICD-10-CM | POA: Diagnosis not present

## 2015-07-20 NOTE — Progress Notes (Signed)
Cardiology Office Note   Date:  07/20/2015   ID:  Alexander [name], DOB 09/23/1970, MRN [id number]  PCP:  Alexander Koch, MD  Cardiologist:  Dr Alexander [name]  Alexander Casino, MD   Chief Complaint  Patient presents with  . Follow-up    3 month followup; no chest pain, occassional shortness of breath, no edema, no pain in legs, no cramping in legs, occassional lightheadedness or dizziness    History of Present Illness: Alexander [name] is a 45 y.o. male with a history of DM, HL, HTN, d/c 09/22 after hospitalization for atyp CP, hypertensive urgency, EF 40-45% by echo w/ gr 2 DD, MV w/ small defect EF 33%. ARF on CKD. He was recently seen by Alexander [name] and medications were adjusted. It was noted that his creatinine had risen and he was taken off of ACE inhibitor and a increase of his carvedilol was made. His blood pressure today is very well controlled in the office at 122/76, however I reviewed a blood pressure log from home which indicated varying blood pressures between [id number] and [id number] systolic as well as diastolics between 90 and [id number] at times. This suggests that it may not be as well controlled as we suspect. Is not clear what would be causing his labile blood pressures. He is on 3 times a day hydralazine which he takes without fail. There could be a waning effect of the medication. He does have some chronic kidney disease and another possibility could be some degree of fibromuscular dysplasia or renal artery stenosis. I do not believe this is been evaluated. Of note he had a repeat echocardiogram pleased to say that his EF is now come back to 55-60%. He did have a low risk Myoview in the hospital, but his ejection fraction was reduced. Currently he is asymptomatic and denies chest pain or worsening shortness of breath.  Past Medical History  Diagnosis Date  . DIABETES MELLITUS, TYPE II 03/20/2007  . HYPERCHOLESTEROLEMIA 07/21/2008  . HYPERTENSION 03/20/2007  . CHEST PAIN 08/28/2009  .  Hypertensive urgency 03/27/2015  . Left ventricular dysfunction 03/28/2015    EF 40-45%, no WMA, grade 2 diastolic dysfunction    Past Surgical History  Procedure Laterality Date  . Knee arthroscopy Right     Current Outpatient Prescriptions  Medication Sig Dispense Refill  . aspirin EC 81 MG EC tablet Take 1 tablet (81 mg total) by mouth daily.    Marland Kitchen atorvastatin (LIPITOR) 40 MG tablet Take 1 tablet (40 mg total) by mouth daily at 6 PM. 90 tablet 3  . carvedilol (COREG) 6.25 MG tablet Take 1 tablet (6.25 mg total) by mouth 2 (two) times daily with a meal. 60 tablet 5  . hydrALAZINE (APRESOLINE) 100 MG tablet Take 1 tablet (100 mg total) by mouth every 8 (eight) hours. 90 tablet 5  . insulin detemir (LEVEMIR) 100 UNIT/ML injection Inject 2 mLs (200 Units total) into the skin every morning. (Patient taking differently: Inject 200 Units into the skin every morning. Patient states he is taking 200 units daily per patient) 70 mL 11  . lisinopril (PRINIVIL,ZESTRIL) 20 MG tablet Take 1 tablet (20 mg total) by mouth daily. 90 tablet 3  . nitroGLYCERIN (NITROSTAT) 0.4 MG SL tablet Place 1 tablet (0.4 mg total) under the tongue every 5 (five) minutes x 3 doses as needed for chest pain. Don't take within 24 hrs of Cialis 25 tablet 3  . tadalafil (CIALIS) 20 MG tablet Take 1 tablet (  20 mg total) by mouth daily as needed for erectile dysfunction. 10 tablet 11   No current facility-administered medications for this visit.    Allergies:   Review of patient's allergies indicates no known allergies.    Social History:  The patient  reports that he has never smoked. He has never used smokeless tobacco. He reports that he does not drink alcohol or use illicit drugs.   Family History:  The patient's family history includes Diabetes in his brother, father, maternal grandfather, mother, and paternal grandfather; Hypertension in his brother and father.    ROS:  Please see the history of present illness. All  other systems are reviewed and negative.    PHYSICAL EXAM: VS:  BP 122/76 mmHg  Pulse 83  Ht 6\' 3"  (1.905 m)  Wt 245 lb 6 oz (111.301 kg)  BMI 30.67 kg/m2 , BMI Body mass index is 30.67 kg/(m^2). GEN: Well nourished, well developed, in no acute distress HEENT: normal Neck: no JVD, carotid bruits, or masses Cardiac: RRR; no murmurs, rubs, or gallops,no edema  Respiratory:  clear to auscultation bilaterally, normal work of breathing GI: soft, nontender, nondistended, + BS MS: no deformity or atrophy Skin: warm and dry, no rash Neuro:  Strength and sensation are intact Psych: euthymic mood, full affect   EKG:  EKG is ordered today. The ekg ordered today demonstrates sinus rhythm with nonspecific T-wave changes, unchanged from previous ECGs  Recent Labs: 03/27/2015: B Natriuretic Peptide 186.7* 03/30/2015: Hemoglobin 11.6*; Platelets 344 04/21/2015: ALT 16; BUN 26*; Creatinine, Ser 1.81*; Potassium 4.2; Sodium 139; TSH 1.08    Lipid Panel    Component Value Date/Time   CHOL 107 04/21/2015 0913   TRIG 69.0 04/21/2015 0913   HDL 32.90* 04/21/2015 0913   CHOLHDL 3 04/21/2015 0913   VLDL 13.8 04/21/2015 0913   LDLCALC 61 04/21/2015 0913   LDLDIRECT 164.0 07/15/2014 1703     Wt Readings from Last 3 Encounters:  07/20/15 245 lb 6 oz (111.301 kg)  05/01/15 258 lb (117.028 kg)  04/21/15 256 lb 12.8 oz (116.484 kg)     Other studies Reviewed: Additional studies/ records that were reviewed today include: Hospital records, old ECGs.  ASSESSMENT:  1. Type 2 diabetes on insulin 2. Labile hypertension 3. Recent systolic congestive heart failure with improvement in LVEF to 55-60% 4. Hypertensive heart disease with heart failure 5. Hypertensive nephropathy   PLAN: 1. Alexander [name] has normal blood pressure in the office today however his blood pressure readings at home are fairly labile. We'll continue his current medicines but I would like to check renal artery Dopplers. He does  have a degree of nephropathy and I like to rule out any renal artery stenosis. This will also give [place] an ability to look his renal parenchyma. Ultimately now that his EF is improved she may benefit from a longer acting blood pressure medicine such as amlodipine rather than hydralazine. We can consider changing this at some point. Otherwise we'll continue his other medications. He may benefit from seeing a nephrologist at some point but I'll defer to his primary care provider. Follow-up in 6 months.   Orders Placed This Encounter  Procedures  . EKG 12-Lead    Alexander Casino, MD, [facility] Attending Cardiologist [place], MD  07/20/2015 10:42 AM

## 2015-07-20 NOTE — Patient Instructions (Signed)
Medication Instructions:  Please continue your current medications  Labwork: NONE  Testing/Procedures: 1. Renal Artery Duplex - Your physician has requested that you have a renal artery duplex. During this test, an ultrasound is used to evaluate blood flow to the kidneys. Allow one hour for this exam. Do not eat after midnight the day before and avoid carbonated beverages. Take your medications as you usually do.  Follow-Up: Dr Debara Pickett recommends that you schedule a follow-up appointment in 6 months. You will receive a reminder letter in the mail two months in advance. If you don't receive a letter, please call our office to schedule the follow-up appointment.  If you need a refill on your cardiac medications before your next appointment, please call your pharmacy.

## 2015-07-25 ENCOUNTER — Ambulatory Visit (HOSPITAL_COMMUNITY)
Admission: RE | Admit: 2015-07-25 | Discharge: 2015-07-25 | Disposition: A | Discharge status: Home / Self Care | Payer: BLUE CROSS/BLUE SHIELD | Source: Ambulatory Visit | Attending: Cardiovascular Disease | Admitting: Cardiovascular Disease

## 2015-07-25 DIAGNOSIS — N179 Acute kidney failure, unspecified: Secondary | ICD-10-CM | POA: Diagnosis not present

## 2015-07-25 DIAGNOSIS — E785 Hyperlipidemia, unspecified: Secondary | ICD-10-CM | POA: Insufficient documentation

## 2015-07-25 DIAGNOSIS — I129 Hypertensive chronic kidney disease with stage 1 through stage 4 chronic kidney disease, or unspecified chronic kidney disease: Secondary | ICD-10-CM | POA: Diagnosis not present

## 2015-07-25 DIAGNOSIS — E1122 Type 2 diabetes mellitus with diabetic chronic kidney disease: Secondary | ICD-10-CM | POA: Diagnosis not present

## 2015-07-25 DIAGNOSIS — N189 Chronic kidney disease, unspecified: Secondary | ICD-10-CM | POA: Diagnosis not present

## 2015-07-25 DIAGNOSIS — R0989 Other specified symptoms and signs involving the circulatory and respiratory systems: Secondary | ICD-10-CM

## 2015-07-25 DIAGNOSIS — I10 Essential (primary) hypertension: Secondary | ICD-10-CM | POA: Diagnosis present

## 2015-07-25 DIAGNOSIS — E119 Type 2 diabetes mellitus without complications: Secondary | ICD-10-CM | POA: Diagnosis not present

## 2015-07-28 ENCOUNTER — Encounter: Payer: Self-pay | Admitting: Internal Medicine

## 2015-07-28 NOTE — Telephone Encounter (Signed)
Pt aware of results and voiced understanding. 

## 2015-09-01 ENCOUNTER — Ambulatory Visit (INDEPENDENT_AMBULATORY_CARE_PROVIDER_SITE_OTHER): Payer: BLUE CROSS/BLUE SHIELD | Admitting: Endocrinology

## 2015-09-01 ENCOUNTER — Encounter: Payer: Self-pay | Admitting: Endocrinology

## 2015-09-01 VITALS — BP 130/90 | HR 70 | Temp 98.5°F | Ht 75.0 in | Wt 245.6 lb

## 2015-09-01 DIAGNOSIS — Z794 Long term (current) use of insulin: Secondary | ICD-10-CM | POA: Diagnosis not present

## 2015-09-01 DIAGNOSIS — N183 Chronic kidney disease, stage 3 (moderate): Secondary | ICD-10-CM | POA: Diagnosis not present

## 2015-09-01 DIAGNOSIS — E118 Type 2 diabetes mellitus with unspecified complications: Secondary | ICD-10-CM

## 2015-09-01 DIAGNOSIS — E1122 Type 2 diabetes mellitus with diabetic chronic kidney disease: Secondary | ICD-10-CM | POA: Diagnosis not present

## 2015-09-01 LAB — POCT GLYCOSYLATED HEMOGLOBIN (HGB A1C): Hemoglobin A1C: 6.6

## 2015-09-01 MED ORDER — INSULIN DETEMIR 100 UNIT/ML ~~LOC~~ SOLN: 160.0000 [IU] | SUBCUTANEOUS | Status: DC

## 2015-09-01 NOTE — Progress Notes (Signed)
Pre visit review using our clinic review tool, if applicable. No additional management support is needed unless otherwise documented below in the visit note. 

## 2015-09-01 NOTE — Patient Instructions (Signed)
check your blood sugar 2 times a day.  vary the time of day when you check, between before the 3 meals, and at bedtime.  also check if you have symptoms of your blood sugar being too high or too low.  please keep a record of the readings and bring it to your next appointment here.  please call us sooner if your blood sugar goes below 70, or if you have a lot of readings over 200.   Please come back for a follow-up appointment in 4 months.   Please reduce the levemir to 160 units each morning.   On this type of insulin schedule, you should eat meals on a regular schedule.  If a meal is missed or significantly delayed, your blood sugar could go low.

## 2015-09-01 NOTE — Progress Notes (Signed)
Subjective:    Patient ID: Alexander [name], male    DOB: May 26, 1971, 45 y.o.   MRN: [id number]  HPI Pt returns for f/u of diabetes mellitus: DM type: insulin-requiring type 2 Dx'ed: 123XX123 Complications: polyneuropathy, renal insufficiency, and retinopathy.   Therapy: insulin since 2013.   DKA: never.  Severe hypoglycemia: never Pancreatitis: never.  Other: he has requested qd insulin.  He declines weight loss surgery.   Interval history: he brings a record of his cbg's which i have reviewed today.  It varies from 31-200's.  It is low when a meal is missed or delayed.  pt states he feels well in general.  He says he never misses the insulin.   Past Medical History  Diagnosis Date  . DIABETES MELLITUS, TYPE II 03/20/2007  . HYPERCHOLESTEROLEMIA 07/21/2008  . HYPERTENSION 03/20/2007  . CHEST PAIN 08/28/2009  . Hypertensive urgency 03/27/2015  . Left ventricular dysfunction 03/28/2015    EF 40-45%, no WMA, grade 2 diastolic dysfunction    Past Surgical History  Procedure Laterality Date  . Knee arthroscopy Right     Social History   Social History  . Marital Status: Married    Spouse Name: N/A  . Number of Children: N/A  . Years of Education: N/A   Occupational History  . [employer]   Social History Main Topics  . Smoking status: Never Smoker   . Smokeless tobacco: Never Used  . Alcohol Use: No  . Drug Use: No  . Sexual Activity: Not on file   Other Topics Concern  . Not on file   Social History Narrative    Current Outpatient Prescriptions on File Prior to Visit  Medication Sig Dispense Refill  . aspirin EC 81 MG EC tablet Take 1 tablet (81 mg total) by mouth daily.    Marland Kitchen atorvastatin (LIPITOR) 40 MG tablet Take 1 tablet (40 mg total) by mouth daily at 6 PM. 90 tablet 3  . carvedilol (COREG) 12.5 MG tablet Take 1 tablet (12.5 mg total) by mouth 2 (two) times daily. 60 tablet 9  . FREESTYLE LITE test strip USE 2 TIMES DAILY 100 each 2  . hydrALAZINE  (APRESOLINE) 100 MG tablet Take 1 tablet (100 mg total) by mouth every 8 (eight) hours. 90 tablet 5  . nitroGLYCERIN (NITROSTAT) 0.4 MG SL tablet Place 1 tablet (0.4 mg total) under the tongue every 5 (five) minutes x 3 doses as needed for chest pain. Don't take within 24 hrs of Cialis 25 tablet 3  . tadalafil (CIALIS) 20 MG tablet Take 1 tablet (20 mg total) by mouth daily as needed for erectile dysfunction. 10 tablet 11   No current facility-administered medications on file prior to visit.    No Known Allergies  Family History  Problem Relation Age of Onset  . Diabetes Mother   . Diabetes Father   . Hypertension Father   . Diabetes Brother   . Hypertension Brother   . Diabetes Maternal Grandfather   . Diabetes Paternal Grandfather     BP 130/90 mmHg  Pulse 70  Temp(Src) 98.5 F (36.9 C) (Oral)  Ht 6\' 3"  (1.905 m)  Wt 245 lb 9.6 oz (111.403 kg)  BMI 30.70 kg/m2  SpO2 97%  Review of Systems Denies LOC    Objective:   Physical Exam VITAL SIGNS:  See vs page GENERAL: no distress Pulses: dorsalis pedis intact bilat.   MSK: no deformity of the feet.  CV: no leg edema.  Skin:  no ulcer on the feet.  normal color and temp on the feet. Neuro: sensation is intact to touch on the feet.   Ext: There is bilateral onychomycosis of the toenails  Lab Results  Component Value Date   HGBA1C 6.6 09/01/2015      Assessment & Plan:  DM: overcontrolled, given this regimen, which does match insulin to his changing needs throughout the day.  Patient is advised the following: Patient Instructions  check your blood sugar 2 times a day.  vary the time of day when you check, between before the 3 meals, and at bedtime.  also check if you have symptoms of your blood sugar being too high or too low.  please keep a record of the readings and bring it to your next appointment here.  please call us sooner if your blood sugar goes below 70, or if you have a lot of readings over 200.   Please  come back for a follow-up appointment in 4 months.   Please reduce the levemir to 160 units each morning.   On this type of insulin schedule, you should eat meals on a regular schedule.  If a meal is missed or significantly delayed, your blood sugar could go low.

## 2015-09-22 ENCOUNTER — Ambulatory Visit: Payer: Self-pay | Admitting: Internal Medicine

## 2015-09-29 ENCOUNTER — Ambulatory Visit (INDEPENDENT_AMBULATORY_CARE_PROVIDER_SITE_OTHER): Payer: BLUE CROSS/BLUE SHIELD | Admitting: Internal Medicine

## 2015-09-29 ENCOUNTER — Other Ambulatory Visit (INDEPENDENT_AMBULATORY_CARE_PROVIDER_SITE_OTHER): Payer: BLUE CROSS/BLUE SHIELD

## 2015-09-29 ENCOUNTER — Encounter: Payer: Self-pay | Admitting: Internal Medicine

## 2015-09-29 VITALS — BP 158/84 | HR 76 | Temp 98.5°F | Resp 16 | Ht 75.0 in | Wt 249.8 lb

## 2015-09-29 DIAGNOSIS — I110 Hypertensive heart disease with heart failure: Secondary | ICD-10-CM

## 2015-09-29 DIAGNOSIS — N183 Chronic kidney disease, stage 3 unspecified: Secondary | ICD-10-CM | POA: Insufficient documentation

## 2015-09-29 DIAGNOSIS — N184 Chronic kidney disease, stage 4 (severe): Secondary | ICD-10-CM | POA: Insufficient documentation

## 2015-09-29 DIAGNOSIS — I129 Hypertensive chronic kidney disease with stage 1 through stage 4 chronic kidney disease, or unspecified chronic kidney disease: Secondary | ICD-10-CM | POA: Insufficient documentation

## 2015-09-29 LAB — COMPREHENSIVE METABOLIC PANEL
ALT: 30 U/L (ref 0–53)
AST: 25 U/L (ref 0–37)
Albumin: 4.4 g/dL (ref 3.5–5.2)
Alkaline Phosphatase: 58 U/L (ref 39–117)
BUN: 28 mg/dL — AB (ref 6–23)
CALCIUM: 9.4 mg/dL (ref 8.4–10.5)
CHLORIDE: 103 meq/L (ref 96–112)
CO2: 28 meq/L (ref 19–32)
CREATININE: 1.91 mg/dL — AB (ref 0.40–1.50)
GFR: 49.52 mL/min — ABNORMAL LOW (ref >60.00)
Glucose, Bld: 109 mg/dL — ABNORMAL HIGH (ref 70–99)
POTASSIUM: 4 meq/L (ref 3.5–5.1)
SODIUM: 139 meq/L (ref 135–145)
Total Bilirubin: 0.9 mg/dL (ref 0.2–1.2)
Total Protein: 7.8 g/dL (ref 6.0–8.3)

## 2015-09-29 LAB — PHOSPHORUS: Phosphorus: 4.6 mg/dL (ref 2.3–4.6)

## 2015-09-29 NOTE — Assessment & Plan Note (Signed)
Last GFR 52, rechecking today. Not on ACE-I or ARB due to some AKI with them. Likely secondary to diabetes and hypertension. Diabetes is now well controlled and BP is improving overall.

## 2015-09-29 NOTE — Patient Instructions (Signed)
We are checking the labs today and depending on the results how stable the kidneys are we may want to send you to a kidney specialist since you are so young and we want to focus on keeping the kidneys healthy for many years.   If you want to check the BP cuff you can bring it in and we can check its accuracy.

## 2015-09-29 NOTE — Progress Notes (Signed)
   Subjective:    Patient ID: Alexander [name], male    DOB: February 27, 1971, 45 y.o.   MRN: [id number]  HPI The patient is a 45 YO man coming in for follow up of his BP and CKD stage 3. He is faithfully taking his medications. Checked for renal artery stenosis which was negative. BP better at last visit with cardiology. Elevated today slightly and he has not taken meds this morning. Took during visit. No headaches or chest pains. Feeling well overall and exercising 30 minutes daily.   Review of Systems  Constitutional: Negative for fever, activity change, appetite change, fatigue and unexpected weight change.  HENT: Negative.   Eyes: Negative.   Respiratory: Negative for cough, chest tightness, shortness of breath and wheezing.   Cardiovascular: Negative for chest pain, palpitations and leg swelling.  Gastrointestinal: Negative for nausea, abdominal pain, diarrhea, constipation and abdominal distention.  Musculoskeletal: Negative.   Skin: Negative.   Neurological: Positive for numbness. Negative for dizziness, speech difficulty, weakness, light-headedness and headaches.       Chronic, stable  Psychiatric/Behavioral: Negative.       Objective:   Physical Exam  Constitutional: He is oriented to person, place, and time. He appears well-developed and well-nourished.  Overweight  HENT:  Head: Normocephalic and atraumatic.  Eyes: EOM are normal.  Neck: Normal range of motion.  Cardiovascular: Normal rate and regular rhythm.   Pulmonary/Chest: Effort normal and breath sounds normal. No respiratory distress. He has no wheezes. He has no rales.  Abdominal: Soft. Bowel sounds are normal. He exhibits no distension. There is no tenderness.  Musculoskeletal: He exhibits no edema.  Neurological: He is alert and oriented to person, place, and time. Coordination normal.  Skin: Skin is warm and dry.  Psychiatric: He has a normal mood and affect.   Filed Vitals:   09/29/15 0848  BP: 158/84  Pulse:  76  Temp: 98.5 F (36.9 C)  TempSrc: Oral  Resp: 16  Height: 6\' 3"  (1.905 m)  Weight: 249 lb 12.8 oz (113.309 kg)  SpO2: 98%      Assessment & Plan:

## 2015-09-29 NOTE — Assessment & Plan Note (Signed)
BP elevated today off meds. His BP cuff does not sound accurate as his home reading this morning was higher than ours in visit. RAS screen negative. Taking coreg, hydralazine and fairly well controlled overall. Checking CMP and phosphorus and PTH and may refer to nephrology given that he is so young with moderate disease already.

## 2015-09-29 NOTE — Progress Notes (Signed)
Pre visit review using our clinic review tool, if applicable. No additional management support is needed unless otherwise documented below in the visit note. 

## 2015-10-02 ENCOUNTER — Other Ambulatory Visit: Payer: Self-pay | Admitting: Internal Medicine

## 2015-10-02 DIAGNOSIS — N183 Chronic kidney disease, stage 3 (moderate): Secondary | ICD-10-CM

## 2015-10-02 LAB — PTH, INTACT AND CALCIUM
CALCIUM: 9.2 mg/dL (ref 8.4–10.5)
PTH: 89 pg/mL — AB (ref 14–64)

## 2015-10-19 ENCOUNTER — Other Ambulatory Visit: Payer: Self-pay | Admitting: Physician Assistant

## 2015-10-20 ENCOUNTER — Ambulatory Visit: Payer: BLUE CROSS/BLUE SHIELD | Admitting: Internal Medicine

## 2015-10-20 NOTE — Telephone Encounter (Signed)
Please review for refill, Thank you. 

## 2015-10-20 NOTE — Telephone Encounter (Signed)
Rx(s) sent to pharmacy electronically.  

## 2015-11-09 DIAGNOSIS — N183 Chronic kidney disease, stage 3 (moderate): Secondary | ICD-10-CM | POA: Diagnosis not present

## 2015-11-09 DIAGNOSIS — I129 Hypertensive chronic kidney disease with stage 1 through stage 4 chronic kidney disease, or unspecified chronic kidney disease: Secondary | ICD-10-CM | POA: Diagnosis not present

## 2015-11-09 DIAGNOSIS — E559 Vitamin D deficiency, unspecified: Secondary | ICD-10-CM | POA: Insufficient documentation

## 2015-11-09 DIAGNOSIS — Z794 Long term (current) use of insulin: Secondary | ICD-10-CM | POA: Diagnosis not present

## 2015-11-09 DIAGNOSIS — E1165 Type 2 diabetes mellitus with hyperglycemia: Secondary | ICD-10-CM | POA: Diagnosis not present

## 2015-11-09 DIAGNOSIS — D631 Anemia in chronic kidney disease: Secondary | ICD-10-CM | POA: Insufficient documentation

## 2015-11-28 ENCOUNTER — Ambulatory Visit (INDEPENDENT_AMBULATORY_CARE_PROVIDER_SITE_OTHER): Payer: BLUE CROSS/BLUE SHIELD | Admitting: Family

## 2015-11-28 ENCOUNTER — Telehealth: Payer: Self-pay | Admitting: Internal Medicine

## 2015-11-28 ENCOUNTER — Encounter: Payer: Self-pay | Admitting: Family

## 2015-11-28 ENCOUNTER — Other Ambulatory Visit (INDEPENDENT_AMBULATORY_CARE_PROVIDER_SITE_OTHER): Payer: BLUE CROSS/BLUE SHIELD

## 2015-11-28 VITALS — BP 132/82 | HR 75 | Temp 97.4°F | Resp 16 | Ht 75.0 in | Wt 242.1 lb

## 2015-11-28 DIAGNOSIS — R42 Dizziness and giddiness: Secondary | ICD-10-CM | POA: Diagnosis not present

## 2015-11-28 LAB — CBC
HEMATOCRIT: 35 % — AB (ref 39.0–52.0)
Hemoglobin: 11.6 g/dL — ABNORMAL LOW (ref 13.0–17.0)
MCHC: 33.3 g/dL (ref 30.0–36.0)
MCV: 80.1 fl (ref 78.0–100.0)
Platelets: 290 10*3/uL (ref 150.0–400.0)
RBC: 4.37 Mil/uL (ref 4.22–5.81)
RDW: 14.2 % (ref 11.5–15.5)
WBC: 8.3 10*3/uL (ref 4.0–10.5)

## 2015-11-28 LAB — BASIC METABOLIC PANEL
BUN: 37 mg/dL — ABNORMAL HIGH (ref 6–23)
CALCIUM: 9.2 mg/dL (ref 8.4–10.5)
CHLORIDE: 104 meq/L (ref 96–112)
CO2: 26 mEq/L (ref 19–32)
Creatinine, Ser: 1.95 mg/dL — ABNORMAL HIGH (ref 0.40–1.50)
GFR: 48.31 mL/min — ABNORMAL LOW (ref >60.00)
Glucose, Bld: 274 mg/dL — ABNORMAL HIGH (ref 70–99)
POTASSIUM: 4.2 meq/L (ref 3.5–5.1)
SODIUM: 135 meq/L (ref 135–145)

## 2015-11-28 NOTE — Progress Notes (Signed)
Pre visit review using our clinic review tool, if applicable. No additional management support is needed unless otherwise documented below in the visit note. 

## 2015-11-28 NOTE — Assessment & Plan Note (Signed)
Symptoms of dizziness/lightheadedness with possible relation to seasonal allergies, dehydration or possible inner ear pathology. Recommend conservative treatment with second-generation antihistamine for congestion with addition of nasal corticosteroid. Continue consuming nonalcoholic/non-caffeinated beverages for hydration. Obtain basic metabolic panel and CBC. Referral to ear nose and throat place per patient request which he will cancel if his symptoms improve.

## 2015-11-28 NOTE — Progress Notes (Signed)
Subjective:    Patient ID: Alexander [name], male    DOB: September 14, 1970, 45 y.o.   MRN: [id number]  Chief Complaint  Patient presents with  . Dizziness    was at work today and started feeling really light headed and dizzy and felt like he was going to fall, balance has been off    HPI:  Alexander [name] is a 45 y.o. male who  has a past medical history of DIABETES MELLITUS, TYPE II (03/20/2007); HYPERCHOLESTEROLEMIA (07/21/2008); HYPERTENSION (03/20/2007); CHEST PAIN (08/28/2009); Hypertensive urgency (03/27/2015); and Left ventricular dysfunction (03/28/2015). and presents today for an acute office visit.   This is a new problem. Associated symptom of dizziness and lightheadness has been going on for about 2-3 weeks. Timing of the symptoms is generally worse at night. Symptoms generally wax and wane. Severity is enough that he felt like he was going of fall at times. He is on blood pressure medication and has lost 30 pounds. Endorses a mild headache and does have occasional congestion and pain in his left ear. Course of the symptoms have remain longer than usual. Drinks about 40-50 oz of water with minimal caffeine. Denies changes in vision.   No Known Allergies   Current Outpatient Prescriptions on File Prior to Visit  Medication Sig Dispense Refill  . aspirin EC 81 MG EC tablet Take 1 tablet (81 mg total) by mouth daily.    Marland Kitchen atorvastatin (LIPITOR) 40 MG tablet Take 1 tablet (40 mg total) by mouth daily at 6 PM. 90 tablet 3  . carvedilol (COREG) 12.5 MG tablet Take 1 tablet (12.5 mg total) by mouth 2 (two) times daily. 60 tablet 9  . FREESTYLE LITE test strip USE 2 TIMES DAILY 100 each 2  . hydrALAZINE (APRESOLINE) 100 MG tablet Take 1 tablet (100 mg total) by mouth 3 (three) times daily. 90 tablet 9  . insulin detemir (LEVEMIR) 100 UNIT/ML injection Inject 1.6 mLs (160 Units total) into the skin every morning. And syringes 2 per day 60 mL 11  . nitroGLYCERIN (NITROSTAT) 0.4 MG SL tablet  Place 1 tablet (0.4 mg total) under the tongue every 5 (five) minutes x 3 doses as needed for chest pain. Don't take within 24 hrs of Cialis 25 tablet 3  . tadalafil (CIALIS) 20 MG tablet Take 1 tablet (20 mg total) by mouth daily as needed for erectile dysfunction. 10 tablet 11   No current facility-administered medications on file prior to visit.     Review of Systems  Constitutional: Negative for fever and chills.  HENT: Positive for congestion.   Eyes: Negative for visual disturbance.       Watery eyes  Respiratory: Negative for chest tightness and shortness of breath.   Cardiovascular: Negative for chest pain, palpitations and leg swelling.  Neurological: Positive for light-headedness. Negative for dizziness, tremors and weakness.      Objective:    BP 132/82 mmHg  Pulse 75  Temp(Src) 97.4 F (36.3 C) (Oral)  Resp 16  Ht 6\' 3"  (1.905 m)  Wt 242 lb 1.9 oz (109.825 kg)  BMI 30.26 kg/m2  SpO2 98% Nursing note and vital signs reviewed.  Physical Exam  Constitutional: He is oriented to person, place, and time. He appears well-developed and well-nourished. No distress.  HENT:  Right Ear: Hearing, tympanic membrane, external ear and ear canal normal.  Left Ear: Hearing, tympanic membrane, external ear and ear canal normal.  Nose: Nose normal. Right sinus exhibits no maxillary sinus tenderness  and no frontal sinus tenderness. Left sinus exhibits no maxillary sinus tenderness and no frontal sinus tenderness.  Mouth/Throat: Uvula is midline, oropharynx is clear and moist and mucous membranes are normal.  Significant amount of wax noted bilaterally with left greater than right.  Eyes: Conjunctivae and EOM are normal. Pupils are equal, round, and reactive to light.  Cardiovascular: Normal rate, regular rhythm, normal heart sounds and intact distal pulses.  Exam reveals no gallop and no friction rub.   No murmur heard. Pulmonary/Chest: Effort normal and breath sounds normal. No  respiratory distress. He has no wheezes. He has no rales. He exhibits no tenderness.  Neurological: He is alert and oriented to person, place, and time.  Skin: Skin is warm and dry.  Psychiatric: He has a normal mood and affect. His behavior is normal. Judgment and thought content normal.       Assessment & Plan:   Problem List Items Addressed This Visit      Other   Dizziness - Primary    Symptoms of dizziness/lightheadedness with possible relation to seasonal allergies, dehydration or possible inner ear pathology. Recommend conservative treatment with second-generation antihistamine for congestion with addition of nasal corticosteroid. Continue consuming nonalcoholic/non-caffeinated beverages for hydration. Obtain basic metabolic panel and CBC. Referral to ear nose and throat place per patient request which he will cancel if his symptoms improve.      Relevant Orders   Ambulatory referral to ENT   CBC   Basic Metabolic Panel (BMET)       I am having Mr. Stamler maintain his tadalafil, aspirin, nitroGLYCERIN, atorvastatin, carvedilol, FREESTYLE LITE, insulin detemir, and hydrALAZINE.   Follow-up: Return if symptoms worsen or fail to improve.  Mauricio Po, FNP

## 2015-11-28 NOTE — Patient Instructions (Addendum)
Thank you for choosing [facility].  Summary/Instructions:  Please continue to take her medications as prescribed.  A referral for ear nose and throat has been placed.  For your dizziness please continue to drink nonalcoholic/non-caffeinated beverages a goal of straw yellow urine.  May also try an antihistamine such as Claritin, Allegra, Zyrtec, or Xyzal.  Please stop by the lab on the basement level of the building for your blood work. Your results will be released to [place] (or called to you) after review, usually within 72 hours after test completion. If any changes need to be made, you will be notified at that same time.  If your symptoms worsen or fail to improve, please contact our office for further instruction, or in case of emergency go directly to the emergency room at the closest medical facility.

## 2015-11-28 NOTE — Telephone Encounter (Signed)
Patient Name: Jazmine Peckenpaugh  DOB: Jun 07, 1971    Initial Comment Caller states been having a lot of dizziness, felt like going to fall out of chair. He thinks it might be inner ear   Nurse Assessment  Nurse: Mallie Mussel, RN, Alveta Heimlich Date/Time Eilene Ghazi Time): 11/28/2015 12:18:57 PM  Confirm and document reason for call. If symptomatic, describe symptoms. You must click the next button to save text entered. ---Caller states that he has had dizziness which began a few weeks ago. Usually only bothers him in the evening. It has not subsided today. He does not feel the room spinning. When he walks, he feels like he is about to fall. His balance is off.  Has the patient traveled out of the country within the last 30 days? ---No  Does the patient have any new or worsening symptoms? ---Yes  Will a triage be completed? ---Yes  Related visit to physician within the last 2 weeks? ---No  Does the PT have any chronic conditions? (i.e. diabetes, asthma, etc.) ---Yes  List chronic conditions. ---HTN, Diabetes, Hypercholesterolemia  Is this a behavioral health or substance abuse call? ---No     Guidelines    Guideline Title Affirmed Question Affirmed Notes  Dizziness - Lightheadedness [1] MODERATE dizziness (e.g., interferes with normal activities) AND [2] has NOT been evaluated by physician for this (Exception: dizziness caused by heat exposure, sudden standing, or poor fluid intake)    Final Disposition User   See Physician within [place], RN, Alveta Heimlich    Comments  Dr. Sharlet Salina does not have any appointments today or tomorrow morning. I scheduled him to see Dr. Elna Breslow at 1:30 pm today.   Referrals  REFERRED TO PCP OFFICE   Disagree/Comply: Comply

## 2015-12-22 ENCOUNTER — Ambulatory Visit: Payer: BLUE CROSS/BLUE SHIELD | Admitting: Endocrinology

## 2016-01-02 DIAGNOSIS — R42 Dizziness and giddiness: Secondary | ICD-10-CM | POA: Diagnosis not present

## 2016-01-08 DIAGNOSIS — E559 Vitamin D deficiency, unspecified: Secondary | ICD-10-CM | POA: Diagnosis not present

## 2016-01-08 DIAGNOSIS — E1122 Type 2 diabetes mellitus with diabetic chronic kidney disease: Secondary | ICD-10-CM | POA: Diagnosis not present

## 2016-01-08 DIAGNOSIS — N183 Chronic kidney disease, stage 3 (moderate): Secondary | ICD-10-CM | POA: Diagnosis not present

## 2016-01-08 DIAGNOSIS — D631 Anemia in chronic kidney disease: Secondary | ICD-10-CM | POA: Diagnosis not present

## 2016-01-08 DIAGNOSIS — M9080 Osteopathy in diseases classified elsewhere, unspecified site: Secondary | ICD-10-CM | POA: Diagnosis not present

## 2016-01-08 DIAGNOSIS — E889 Metabolic disorder, unspecified: Secondary | ICD-10-CM | POA: Diagnosis not present

## 2016-01-11 DIAGNOSIS — I129 Hypertensive chronic kidney disease with stage 1 through stage 4 chronic kidney disease, or unspecified chronic kidney disease: Secondary | ICD-10-CM | POA: Diagnosis not present

## 2016-01-11 DIAGNOSIS — E1122 Type 2 diabetes mellitus with diabetic chronic kidney disease: Secondary | ICD-10-CM | POA: Diagnosis not present

## 2016-01-11 DIAGNOSIS — D631 Anemia in chronic kidney disease: Secondary | ICD-10-CM | POA: Diagnosis not present

## 2016-01-11 DIAGNOSIS — E889 Metabolic disorder, unspecified: Secondary | ICD-10-CM | POA: Diagnosis not present

## 2016-01-16 ENCOUNTER — Ambulatory Visit (INDEPENDENT_AMBULATORY_CARE_PROVIDER_SITE_OTHER): Payer: BLUE CROSS/BLUE SHIELD | Admitting: Endocrinology

## 2016-01-16 ENCOUNTER — Encounter: Payer: Self-pay | Admitting: Endocrinology

## 2016-01-16 VITALS — BP 150/70 | HR 84 | Ht 75.0 in | Wt 242.6 lb

## 2016-01-16 DIAGNOSIS — E118 Type 2 diabetes mellitus with unspecified complications: Secondary | ICD-10-CM | POA: Diagnosis not present

## 2016-01-16 DIAGNOSIS — Z794 Long term (current) use of insulin: Secondary | ICD-10-CM

## 2016-01-16 LAB — POCT GLYCOSYLATED HEMOGLOBIN (HGB A1C): HEMOGLOBIN A1C: 6.4

## 2016-01-16 MED ORDER — INSULIN DETEMIR 100 UNIT/ML ~~LOC~~ SOLN: 140.0000 [IU] | SUBCUTANEOUS | Status: DC

## 2016-01-16 MED ORDER — INSULIN DETEMIR 100 UNIT/ML FLEXPEN: 90.0000 [IU] | PEN_INJECTOR | Freq: Every day | SUBCUTANEOUS | Status: DC

## 2016-01-16 NOTE — Progress Notes (Signed)
Subjective:    Patient ID: Alexander [name], male    DOB: 11-07-1970, 45 y.o.   MRN: [id number]  HPI Pt returns for f/u of diabetes mellitus: DM type: insulin-requiring type 2 Dx'ed: 123XX123 Complications: polyneuropathy, renal insufficiency, and retinopathy.   Therapy: insulin since 2013.   DKA: never.  Severe hypoglycemia: never Pancreatitis: never.  Other: he has requested qd insulin.  He declines weight loss surgery.   Interval history: no cbg record, but states cbg's vary from 69-152.  It is low when a meal is missed or delayed, but this seldom happens.  There is no trend throughout the day.  pt states he feels well in general.  He says he never misses the insulin. He takes 90-160 units qam, but he averages 90-100 units qam.  Past Medical History  Diagnosis Date  . DIABETES MELLITUS, TYPE II 03/20/2007  . HYPERCHOLESTEROLEMIA 07/21/2008  . HYPERTENSION 03/20/2007  . CHEST PAIN 08/28/2009  . Hypertensive urgency 03/27/2015  . Left ventricular dysfunction 03/28/2015    EF 40-45%, no WMA, grade 2 diastolic dysfunction    Past Surgical History  Procedure Laterality Date  . Knee arthroscopy Right     Social History   Social History  . Marital Status: Married    Spouse Name: N/A  . Number of Children: N/A  . Years of Education: N/A   Occupational History  . [employer]   Social History Main Topics  . Smoking status: Never Smoker   . Smokeless tobacco: Never Used  . Alcohol Use: No  . Drug Use: No  . Sexual Activity: Not on file   Other Topics Concern  . Not on file   Social History Narrative    Current Outpatient Prescriptions on File Prior to Visit  Medication Sig Dispense Refill  . aspirin EC 81 MG EC tablet Take 1 tablet (81 mg total) by mouth daily.    Marland Kitchen atorvastatin (LIPITOR) 40 MG tablet Take 1 tablet (40 mg total) by mouth daily at 6 PM. 90 tablet 3  . carvedilol (COREG) 12.5 MG tablet Take 1 tablet (12.5 mg total) by mouth 2 (two) times daily. 60  tablet 9  . FREESTYLE LITE test strip USE 2 TIMES DAILY 100 each 2  . hydrALAZINE (APRESOLINE) 100 MG tablet Take 1 tablet (100 mg total) by mouth 3 (three) times daily. 90 tablet 9  . nitroGLYCERIN (NITROSTAT) 0.4 MG SL tablet Place 1 tablet (0.4 mg total) under the tongue every 5 (five) minutes x 3 doses as needed for chest pain. Don't take within 24 hrs of Cialis 25 tablet 3  . tadalafil (CIALIS) 20 MG tablet Take 1 tablet (20 mg total) by mouth daily as needed for erectile dysfunction. 10 tablet 11   No current facility-administered medications on file prior to visit.    No Known Allergies  Family History  Problem Relation Age of Onset  . Diabetes Mother   . Diabetes Father   . Hypertension Father   . Diabetes Brother   . Hypertension Brother   . Diabetes Maternal Grandfather   . Diabetes Paternal Grandfather     BP 150/70 mmHg  Pulse 84  Ht 6\' 3"  (1.905 m)  Wt 242 lb 9.6 oz (110.043 kg)  BMI 30.32 kg/m2  SpO2 96%  Review of Systems Denies LOC.  He has lost weight.      Objective:   Physical Exam VITAL SIGNS:  See vs page GENERAL: no distress Pulses: dorsalis pedis intact bilat.  MSK: no deformity of the feet.  CV: no leg edema.  Skin:  no ulcer on the feet.  normal color and temp on the feet. Neuro: sensation is intact to touch on the feet.   Ext: There is bilateral onychomycosis of the toenails.   Lab Results  Component Value Date   CREATININE 1.95* 11/28/2015   BUN 37* 11/28/2015   NA 135 11/28/2015   K 4.2 11/28/2015   CL 104 11/28/2015   CO2 26 11/28/2015   A1c=6.4%    Assessment & Plan:  Insulin-requiring type 2 DM: overcontrolled, given this regimen, which does match insulin to his changing needs throughout the day. Renal insufficiency, worse: he is at risk for hypoglycemia  Patient is advised the following: Patient Instructions  check your blood sugar 2 times a day.  vary the time of day when you check, between before the 3 meals, and at  bedtime.  also check if you have symptoms of your blood sugar being too high or too low.  please keep a record of the readings and bring it to your next appointment here.  please call us sooner if your blood sugar goes below 70, or if you have a lot of readings over 200.   Please come back for a follow-up appointment in 4 months.    Please reduce the levemir to 90 units each morning.    On this type of insulin schedule, you should eat meals on a regular schedule.  If a meal is missed or significantly delayed, your blood sugar could go low.     Renato Shin, MD

## 2016-01-16 NOTE — Patient Instructions (Addendum)
check your blood sugar 2 times a day.  vary the time of day when you check, between before the 3 meals, and at bedtime.  also check if you have symptoms of your blood sugar being too high or too low.  please keep a record of the readings and bring it to your next appointment here.  please call us sooner if your blood sugar goes below 70, or if you have a lot of readings over 200.   Please come back for a follow-up appointment in 4 months.    Please reduce the levemir to 90 units each morning.    On this type of insulin schedule, you should eat meals on a regular schedule.  If a meal is missed or significantly delayed, your blood sugar could go low.

## 2016-01-26 ENCOUNTER — Other Ambulatory Visit: Payer: Self-pay | Admitting: Endocrinology

## 2016-02-27 ENCOUNTER — Other Ambulatory Visit: Payer: Self-pay | Admitting: Endocrinology

## 2016-03-02 ENCOUNTER — Other Ambulatory Visit: Payer: Self-pay | Admitting: Physician Assistant

## 2016-03-04 NOTE — Telephone Encounter (Signed)
Rx request sent to pharmacy.  

## 2016-03-31 ENCOUNTER — Other Ambulatory Visit: Payer: Self-pay | Admitting: Internal Medicine

## 2016-04-05 ENCOUNTER — Encounter: Payer: Self-pay | Admitting: Internal Medicine

## 2016-04-05 ENCOUNTER — Ambulatory Visit (INDEPENDENT_AMBULATORY_CARE_PROVIDER_SITE_OTHER): Payer: BLUE CROSS/BLUE SHIELD | Admitting: Internal Medicine

## 2016-04-05 DIAGNOSIS — Z23 Encounter for immunization: Secondary | ICD-10-CM

## 2016-04-05 DIAGNOSIS — I110 Hypertensive heart disease with heart failure: Secondary | ICD-10-CM | POA: Diagnosis not present

## 2016-04-05 NOTE — Progress Notes (Signed)
   Subjective:    Patient ID: Alexander [name], male    DOB: [birth year], 45 y.o.   MRN: [id number]  HPI The patient is a 45 YO man coming in for follow up of his blood pressure. He has not taken his meds this morning yet but does take them every day. He sees his nephrologist next week. They are checking his blood work. He denies headaches or chest pains. No SOB or cough.   Review of Systems  Constitutional: Negative for activity change, appetite change, fatigue, fever and unexpected weight change.  Respiratory: Negative for cough, chest tightness, shortness of breath and wheezing.   Cardiovascular: Negative for chest pain, palpitations and leg swelling.  Gastrointestinal: Negative for abdominal distention, abdominal pain, constipation, diarrhea and nausea.  Musculoskeletal: Negative.   Skin: Negative.   Neurological: Positive for numbness. Negative for dizziness, speech difficulty, weakness, light-headedness and headaches.       Chronic, stable      Objective:   Physical Exam  Constitutional: He is oriented to person, place, and time. He appears well-developed and well-nourished.  Overweight  HENT:  Head: Normocephalic and atraumatic.  Eyes: EOM are normal.  Neck: Normal range of motion.  Cardiovascular: Normal rate and regular rhythm.   Pulmonary/Chest: Effort normal and breath sounds normal. No respiratory distress. He has no wheezes. He has no rales.  Abdominal: Soft. Bowel sounds are normal. He exhibits no distension. There is no tenderness.  Musculoskeletal: He exhibits no edema.  Neurological: He is alert and oriented to person, place, and time. Coordination normal.  Skin: Skin is warm and dry.   Vitals:   04/05/16 0854 04/05/16 0912  BP: (!) 140/98 (!) 158/98  Pulse: 68   Temp: 98.4 F (36.9 C)   TempSrc: Oral   SpO2: 98%   Weight: 240 lb (108.9 kg)       Assessment & Plan:  Flu shot given at visit.

## 2016-04-05 NOTE — Progress Notes (Signed)
Pre visit review using our clinic review tool, if applicable. No additional management support is needed unless otherwise documented below in the visit note. 

## 2016-04-05 NOTE — Patient Instructions (Signed)
We have given you the flu shot today.   You can make a nurse visit to bring in the blood pressure monitor to get it checked out.

## 2016-04-05 NOTE — Assessment & Plan Note (Signed)
Has not taken BP meds today and BP is elevated. He states BP at goal at home with meds. Asked him to take his meds prior to visits so we can adjust his meds if needed. He will see nephrology next week and wishes to defer labs to them.

## 2016-04-08 ENCOUNTER — Other Ambulatory Visit: Payer: Self-pay | Admitting: Internal Medicine

## 2016-04-08 NOTE — Telephone Encounter (Signed)
REFILL 

## 2016-05-06 ENCOUNTER — Other Ambulatory Visit: Payer: Self-pay

## 2016-05-06 MED ORDER — CARVEDILOL 12.5 MG PO TABS: 12.5000 mg | ORAL_TABLET | Freq: Two times a day (BID) | ORAL | 0 refills | Status: DC

## 2016-05-06 NOTE — Telephone Encounter (Signed)
Rx(s) sent to pharmacy electronically.  

## 2016-05-17 ENCOUNTER — Ambulatory Visit: Payer: BLUE CROSS/BLUE SHIELD | Admitting: Endocrinology

## 2016-05-24 ENCOUNTER — Ambulatory Visit: Payer: BLUE CROSS/BLUE SHIELD | Admitting: Endocrinology

## 2016-06-12 ENCOUNTER — Ambulatory Visit: Payer: BLUE CROSS/BLUE SHIELD | Admitting: Endocrinology

## 2016-07-05 DIAGNOSIS — N183 Chronic kidney disease, stage 3 (moderate): Secondary | ICD-10-CM | POA: Diagnosis not present

## 2016-07-05 DIAGNOSIS — I129 Hypertensive chronic kidney disease with stage 1 through stage 4 chronic kidney disease, or unspecified chronic kidney disease: Secondary | ICD-10-CM | POA: Diagnosis not present

## 2016-07-05 DIAGNOSIS — E1122 Type 2 diabetes mellitus with diabetic chronic kidney disease: Secondary | ICD-10-CM | POA: Diagnosis not present

## 2016-07-05 DIAGNOSIS — M9080 Osteopathy in diseases classified elsewhere, unspecified site: Secondary | ICD-10-CM | POA: Diagnosis not present

## 2016-07-05 DIAGNOSIS — E889 Metabolic disorder, unspecified: Secondary | ICD-10-CM | POA: Diagnosis not present

## 2016-07-05 DIAGNOSIS — E559 Vitamin D deficiency, unspecified: Secondary | ICD-10-CM | POA: Diagnosis not present

## 2016-07-05 DIAGNOSIS — D631 Anemia in chronic kidney disease: Secondary | ICD-10-CM | POA: Diagnosis not present

## 2016-07-11 ENCOUNTER — Ambulatory Visit: Payer: BLUE CROSS/BLUE SHIELD | Admitting: Internal Medicine

## 2016-07-12 ENCOUNTER — Ambulatory Visit (INDEPENDENT_AMBULATORY_CARE_PROVIDER_SITE_OTHER): Payer: BLUE CROSS/BLUE SHIELD | Admitting: Endocrinology

## 2016-07-12 ENCOUNTER — Ambulatory Visit: Payer: BLUE CROSS/BLUE SHIELD | Admitting: Endocrinology

## 2016-07-12 ENCOUNTER — Ambulatory Visit (INDEPENDENT_AMBULATORY_CARE_PROVIDER_SITE_OTHER): Payer: BLUE CROSS/BLUE SHIELD | Admitting: Internal Medicine

## 2016-07-12 ENCOUNTER — Encounter: Payer: Self-pay | Admitting: Endocrinology

## 2016-07-12 ENCOUNTER — Encounter: Payer: Self-pay | Admitting: Internal Medicine

## 2016-07-12 VITALS — BP 157/82 | HR 72 | Ht 75.0 in | Wt 241.2 lb

## 2016-07-12 VITALS — BP 144/78 | HR 73 | Ht 75.0 in | Wt 240.0 lb

## 2016-07-12 DIAGNOSIS — Z794 Long term (current) use of insulin: Secondary | ICD-10-CM | POA: Diagnosis not present

## 2016-07-12 DIAGNOSIS — I10 Essential (primary) hypertension: Secondary | ICD-10-CM | POA: Diagnosis not present

## 2016-07-12 DIAGNOSIS — E0821 Diabetes mellitus due to underlying condition with diabetic nephropathy: Secondary | ICD-10-CM

## 2016-07-12 DIAGNOSIS — N183 Chronic kidney disease, stage 3 unspecified: Secondary | ICD-10-CM

## 2016-07-12 DIAGNOSIS — R0989 Other specified symptoms and signs involving the circulatory and respiratory systems: Secondary | ICD-10-CM | POA: Insufficient documentation

## 2016-07-12 DIAGNOSIS — E1122 Type 2 diabetes mellitus with diabetic chronic kidney disease: Secondary | ICD-10-CM

## 2016-07-12 DIAGNOSIS — I110 Hypertensive heart disease with heart failure: Secondary | ICD-10-CM | POA: Diagnosis not present

## 2016-07-12 LAB — POCT GLYCOSYLATED HEMOGLOBIN (HGB A1C): HEMOGLOBIN A1C: 6.5

## 2016-07-12 MED ORDER — INSULIN DETEMIR 100 UNIT/ML FLEXPEN: 80.0000 [IU] | PEN_INJECTOR | SUBCUTANEOUS | 11 refills | Status: DC

## 2016-07-12 NOTE — Patient Instructions (Addendum)
check your blood sugar 2 times a day.  vary the time of day when you check, between before the 3 meals, and at bedtime.  also check if you have symptoms of your blood sugar being too high or too low.  please keep a record of the readings and bring it to your next appointment here.  please call us sooner if your blood sugar goes below 70, or if you have a lot of readings over 200.   Please come back for a follow-up appointment in 4 months.    Please reduce the levemir to 80 units each morning.    On this type of insulin schedule, you should eat meals on a regular schedule.  If a meal is missed or significantly delayed, your blood sugar could go low.

## 2016-07-12 NOTE — Progress Notes (Signed)
Subjective:    Patient ID: Alexander [name], male    DOB: [birth year], 46 y.o.   MRN: [id number]  HPI Pt returns for f/u of diabetes mellitus: DM type: insulin-requiring type 2 Dx'ed: 5397 Complications: polyneuropathy, renal insufficiency, and retinopathy.   Therapy: insulin since 2013.   DKA: never.  Severe hypoglycemia: never.   Pancreatitis: never.  Other: he declines multiple daily injections.  He declines weight loss surgery.   Interval history: no cbg record, but states cbg's are well-controlled.  There is no trend throughout the day.   Past Medical History:  Diagnosis Date  . CHEST PAIN 08/28/2009  . DIABETES MELLITUS, TYPE II 03/20/2007  . HYPERCHOLESTEROLEMIA 07/21/2008  . HYPERTENSION 03/20/2007  . Hypertensive urgency 03/27/2015  . Left ventricular dysfunction 03/28/2015   EF 40-45%, no WMA, grade 2 diastolic dysfunction    Past Surgical History:  Procedure Laterality Date  . KNEE ARTHROSCOPY Right     Social History   Social History  . Marital status: Married    Spouse name: N/A  . Number of children: N/A  . Years of education: N/A   Occupational History  . [employer]   Social History Main Topics  . Smoking status: Never Smoker  . Smokeless tobacco: Never Used  . Alcohol use No  . Drug use: No  . Sexual activity: Not on file   Other Topics Concern  . Not on file   Social History Narrative  . No narrative on file    Current Outpatient Prescriptions on File Prior to Visit  Medication Sig Dispense Refill  . aspirin EC 81 MG EC tablet Take 1 tablet (81 mg total) by mouth daily.    Marland Kitchen atorvastatin (LIPITOR) 40 MG tablet Take 1 tablet (40 mg total) by mouth daily at 6 PM. 90 tablet 3  . BD INSULIN SYRINGE ULTRAFINE 31G X 15/64" 1 ML MISC USE WITH INSULIN DAILY 100 each 3  . carvedilol (COREG) 12.5 MG tablet Take 1 tablet (12.5 mg total) by mouth 2 (two) times daily with a meal. 180 tablet 0  . FREESTYLE LITE test strip USE 2 TIMES DAILY 100 each  2  . hydrALAZINE (APRESOLINE) 100 MG tablet Take 1 tablet (100 mg total) by mouth 3 (three) times daily. 90 tablet 9  . nitroGLYCERIN (NITROSTAT) 0.4 MG SL tablet Place 1 tablet (0.4 mg total) under the tongue every 5 (five) minutes x 3 doses as needed for chest pain. Don't take within 24 hrs of Cialis 25 tablet 3  . tadalafil (CIALIS) 20 MG tablet Take 1 tablet (20 mg total) by mouth daily as needed for erectile dysfunction. 10 tablet 11  . Vitamin D, Ergocalciferol, (DRISDOL) 50000 units CAPS capsule      No current facility-administered medications on file prior to visit.     No Known Allergies  Family History  Problem Relation Age of Onset  . Diabetes Mother   . Diabetes Father   . Hypertension Father   . Diabetes Maternal Grandfather   . Diabetes Paternal Grandfather   . Diabetes Brother   . Hypertension Brother     BP (!) 144/78   Pulse 73   Ht 6\' 3"  (1.905 m)   Wt 240 lb (108.9 kg)   SpO2 97%   BMI 30.00 kg/m    Review of Systems He denies hypoglycemia.      Objective:   Physical Exam VITAL SIGNS:  See vs page GENERAL: no distress Pulses: dorsalis pedis intact bilat.  MSK: no deformity of the feet.  CV: no leg edema.  Skin:  no ulcer on the feet.  normal color and temp on the feet. Neuro: sensation is intact to touch on the feet.   Ext: There is bilateral onychomycosis of the toenails.   Lab Results  Component Value Date   HGBA1C 6.5 07/12/2016      Assessment & Plan:  Insulin-requiring type 2 DM: overcontrolled, given this regimen, which does match insulin to his changing needs throughout the day.  Patient is advised the following: Patient Instructions  check your blood sugar 2 times a day.  vary the time of day when you check, between before the 3 meals, and at bedtime.  also check if you have symptoms of your blood sugar being too high or too low.  please keep a record of the readings and bring it to your next appointment here.  please call us sooner  if your blood sugar goes below 70, or if you have a lot of readings over 200.   Please come back for a follow-up appointment in 4 months.    Please reduce the levemir to 80 units each morning.    On this type of insulin schedule, you should eat meals on a regular schedule.  If a meal is missed or significantly delayed, your blood sugar could go low.

## 2016-07-12 NOTE — Patient Instructions (Addendum)
Your physician wants you to follow-up in: 6 months with Dr. Debara Pickett. You will receive a reminder letter in the mail two months in advance. If you don't receive a letter, please call our office to schedule the follow-up appointment.  Please send your blood pressure readings thru MyChart for Dr. Debara Pickett to review

## 2016-07-12 NOTE — Progress Notes (Signed)
Cardiology Office Note   Date:  07/12/2016   ID:  Laural Golden, DOB Nov 08, 1970, MRN [id number]  PCP:  Hoyt Koch, MD  Cardiologist:  Dr Debara Pickett  [name], MD   Chief Complaint  Patient presents with  . Follow-up    1 year;   . Shortness of Breath    rarely.    History of Present Illness: Alexander [name] is a 46 y.o. male with a history of DM, HL, HTN, d/c 09/22 after hospitalization for atyp CP, hypertensive urgency, EF 40-45% by echo w/ gr 2 DD, MV w/ small defect EF 33%. ARF on CKD. He was recently seen by [name] and medications were adjusted. It was noted that his creatinine had risen and he was taken off of ACE inhibitor and a increase of his carvedilol was made. His blood pressure today is very well controlled in the office at 122/76, however I reviewed a blood pressure log from home which indicated varying blood pressures between 712W and 580D systolic as well as diastolics between 90 and 983 at times. This suggests that it may not be as well controlled as we suspect. Is not clear what would be causing his labile blood pressures. He is on 3 times a day hydralazine which he takes without fail. There could be a waning effect of the medication. He does have some chronic kidney disease and another possibility could be some degree of fibromuscular dysplasia or renal artery stenosis. I do not believe this is been evaluated. Of note he had a repeat echocardiogram pleased to say that his EF is now come back to 55-60%. He did have a low risk Myoview in the hospital, but his ejection fraction was reduced. Currently he is asymptomatic and denies chest pain or worsening shortness of breath.  07/12/2016  Alexander [name] returns today for follow-up. He reports feeling well and generally feels like his blood pressure is somewhat elevated although recently saw a nephrologist in [facility] and he felt like his blood pressure was well controlled. Today's blood pressure is  157/82, however I rechecked the blood pressure and it came back at 120/76. We then checked his blood pressure with his home cuff which was 135/76. There may be a slight discrepancy with a favor to higher blood pressure of his home cuff. In general I feel that his blood pressure control is very good and may need to be tweaked somewhat.  Past Medical History:  Diagnosis Date  . CHEST PAIN 08/28/2009  . DIABETES MELLITUS, TYPE II 03/20/2007  . HYPERCHOLESTEROLEMIA 07/21/2008  . HYPERTENSION 03/20/2007  . Hypertensive urgency 03/27/2015  . Left ventricular dysfunction 03/28/2015   EF 40-45%, no WMA, grade 2 diastolic dysfunction    Past Surgical History:  Procedure Laterality Date  . KNEE ARTHROSCOPY Right     Current Outpatient Prescriptions  Medication Sig Dispense Refill  . aspirin EC 81 MG EC tablet Take 1 tablet (81 mg total) by mouth daily.    Marland Kitchen atorvastatin (LIPITOR) 40 MG tablet Take 1 tablet (40 mg total) by mouth daily at 6 PM. 90 tablet 3  . carvedilol (COREG) 6.25 MG tablet Take 1 tablet (6.25 mg total) by mouth 2 (two) times daily with a meal. 60 tablet 5  . hydrALAZINE (APRESOLINE) 100 MG tablet Take 1 tablet (100 mg total) by mouth every 8 (eight) hours. 90 tablet 5  . insulin detemir (LEVEMIR) 100 UNIT/ML injection Inject 2 mLs (200 Units total) into the skin  every morning. (Patient taking differently: Inject 200 Units into the skin every morning. Patient states he is taking 200 units daily per patient) 70 mL 11  . lisinopril (PRINIVIL,ZESTRIL) 20 MG tablet Take 1 tablet (20 mg total) by mouth daily. 90 tablet 3  . nitroGLYCERIN (NITROSTAT) 0.4 MG SL tablet Place 1 tablet (0.4 mg total) under the tongue every 5 (five) minutes x 3 doses as needed for chest pain. Don't take within 24 hrs of Cialis 25 tablet 3  . tadalafil (CIALIS) 20 MG tablet Take 1 tablet (20 mg total) by mouth daily as needed for erectile dysfunction. 10 tablet 11   No current facility-administered medications  for this visit.    Allergies:   Patient has no known allergies.    Social History:  The patient  reports that he has never smoked. He has never used smokeless tobacco. He reports that he does not drink alcohol or use drugs.   Family History:  The patient's family history includes Diabetes in his brother, father, maternal grandfather, mother, and paternal grandfather; Hypertension in his brother and father.    ROS:  Please see the history of present illness. All other systems are reviewed and negative.    PHYSICAL EXAM: VS:  BP (!) 157/82   Pulse 72   Ht 6\' 3"  (1.905 m)   Wt 241 lb 3.2 oz (109.4 kg)   BMI 30.15 kg/m  , BMI Body mass index is 30.15 kg/m. GEN: Well nourished, well developed, in no acute distress HEENT: normal Neck: no JVD, carotid bruits, or masses Cardiac: RRR; no murmurs, rubs, or gallops,no edema  Respiratory:  clear to auscultation bilaterally, normal work of breathing GI: soft, nontender, nondistended, + BS MS: no deformity or atrophy Skin: warm and dry, no rash Neuro:  Strength and sensation are intact Psych: euthymic mood, full affect   EKG:   Sinus rhythm at 72, anterolateral T-wave changes  Recent Labs: 09/29/2015: ALT 30 11/28/2015: BUN 37; Creatinine, Ser 1.95; Hemoglobin 11.6; Platelets 290.0; Potassium 4.2; Sodium 135    Lipid Panel    Component Value Date/Time   CHOL 107 04/21/2015 0913   TRIG 69.0 04/21/2015 0913   HDL 32.90 (L) 04/21/2015 0913   CHOLHDL 3 04/21/2015 0913   VLDL 13.8 04/21/2015 0913   LDLCALC 61 04/21/2015 0913   LDLDIRECT 164.0 07/15/2014 1703     Wt Readings from Last 3 Encounters:  07/12/16 241 lb 3.2 oz (109.4 kg)  04/05/16 240 lb (108.9 kg)  01/16/16 242 lb 9.6 oz (110 kg)     Other studies Reviewed: Additional studies/ records that were reviewed today include: Hospital records, old ECGs.  ASSESSMENT:  1. Type 2 diabetes on insulin 2. Labile hypertension 3. Recent systolic congestive heart failure with  improvement in LVEF to 55-60% 4. Hypertensive heart disease with heart failure 5. Hypertensive nephropathy   PLAN: 1. Alexander [name] continues to have pretty good blood pressure control although I like for him to follow it more regularly at home. If blood pressure remains elevated greater than 130/80, then I would recommended increasing his carvedilol from 12.5-25 mg twice daily. Follow-up in 6 months.  [name], MD, [facility] Attending Cardiologist [place], MD  07/12/2016 1:04 PM

## 2016-07-23 DIAGNOSIS — L03032 Cellulitis of left toe: Secondary | ICD-10-CM | POA: Diagnosis not present

## 2016-07-23 DIAGNOSIS — M25571 Pain in right ankle and joints of right foot: Secondary | ICD-10-CM | POA: Diagnosis not present

## 2016-07-25 ENCOUNTER — Other Ambulatory Visit: Payer: Self-pay | Admitting: Internal Medicine

## 2016-07-26 NOTE — Telephone Encounter (Signed)
Rx has been sent to the pharmacy electronically. ° °

## 2016-07-30 DIAGNOSIS — L97511 Non-pressure chronic ulcer of other part of right foot limited to breakdown of skin: Secondary | ICD-10-CM | POA: Diagnosis not present

## 2016-08-13 ENCOUNTER — Ambulatory Visit (INDEPENDENT_AMBULATORY_CARE_PROVIDER_SITE_OTHER): Payer: BLUE CROSS/BLUE SHIELD | Admitting: Family Medicine

## 2016-08-13 ENCOUNTER — Encounter: Payer: Self-pay | Admitting: Family Medicine

## 2016-08-13 DIAGNOSIS — S3992XA Unspecified injury of lower back, initial encounter: Secondary | ICD-10-CM

## 2016-08-13 MED ORDER — DICLOFENAC SODIUM 75 MG PO TBEC: 75.0000 mg | DELAYED_RELEASE_TABLET | Freq: Two times a day (BID) | ORAL | 1 refills | Status: DC

## 2016-08-13 MED ORDER — METHOCARBAMOL 500 MG PO TABS: 500.0000 mg | ORAL_TABLET | Freq: Three times a day (TID) | ORAL | 1 refills | Status: DC | PRN

## 2016-08-13 NOTE — Patient Instructions (Signed)
You have a lumbar strain, less likely a flare of your disc herniation from before. Both are treated similarly initially. Ok to take tylenol for baseline pain relief (1-2 extra strength tabs 3x/day) Voltaren twice a day with food for pain and inflammation. Robaxin as needed for muscle spasms. Stay as active as possible. Wait a few days then start home exercises as shown in the handout. Consider prednisone, pain medication, MRI, physical therapy depending on how youo're doing. Call me in a week to let me know how you're doing.

## 2016-08-14 DIAGNOSIS — S3992XA Unspecified injury of lower back, initial encounter: Secondary | ICD-10-CM | POA: Insufficient documentation

## 2016-08-14 NOTE — Assessment & Plan Note (Signed)
consistent with lumbar strain though getting some intermittent radiation into right leg - discussed flare of disc bulge/herniation also a possibility.  Discussed options - he would like to try voltaren with robaxin as needed.  Shown home exercises to do daily.  We discussed physical therapy, prednisone dose pack, MRI as well.  Call us in a week for an update on his status.

## 2016-08-14 NOTE — Progress Notes (Signed)
PCP: Hoyt Koch, MD  Subjective:   HPI: Patient is a 46 y.o. male here for low back injury.  Patient reports on 2/4 he was watching the super bowl when he jumped up from a sitting position. No pain initially but later after this started to develop pain midline of low back into right side. Tried ibuprofen. No numbness or tingling. Some radiation into right leg. Pain level 4-5/10, sharp. 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 No bowel/bladder dysfunction.  Past Medical History:  Diagnosis Date  . CHEST PAIN 08/28/2009  . DIABETES MELLITUS, TYPE II 03/20/2007  . HYPERCHOLESTEROLEMIA 07/21/2008  . HYPERTENSION 03/20/2007  . Hypertensive urgency 03/27/2015  . Left ventricular dysfunction 03/28/2015   EF 40-45%, no WMA, grade 2 diastolic dysfunction    Current Outpatient Prescriptions on File Prior to Visit  Medication Sig Dispense Refill  . aspirin EC 81 MG EC tablet Take 1 tablet (81 mg total) by mouth daily.    Marland Kitchen atorvastatin (LIPITOR) 40 MG tablet TAKE 1 TABLET (40 MG TOTAL) BY MOUTH DAILY AT 6 PM. 90 tablet 3  . BD INSULIN SYRINGE ULTRAFINE 31G X 15/64" 1 ML MISC USE WITH INSULIN DAILY 100 each 3  . carvedilol (COREG) 12.5 MG tablet Take 1 tablet (12.5 mg total) by mouth 2 (two) times daily with a meal. 180 tablet 0  . FREESTYLE LITE test strip USE 2 TIMES DAILY 100 each 2  . hydrALAZINE (APRESOLINE) 100 MG tablet Take 1 tablet (100 mg total) by mouth 3 (three) times daily. 90 tablet 9  . Insulin Detemir (LEVEMIR FLEXTOUCH) 100 UNIT/ML Pen Inject 80 Units into the skin every morning. And pen needles 2/day 30 mL 11  . nitroGLYCERIN (NITROSTAT) 0.4 MG SL tablet Place 1 tablet (0.4 mg total) under the tongue every 5 (five) minutes x 3 doses as needed for chest pain. Don't take within 24 hrs of Cialis 25 tablet 3  . tadalafil (CIALIS) 20 MG tablet Take 1 tablet (20 mg total) by mouth daily as needed for erectile dysfunction. 10  tablet 11  . Vitamin D, Ergocalciferol, (DRISDOL) 50000 units CAPS capsule      No current facility-administered medications on file prior to visit.     Past Surgical History:  Procedure Laterality Date  . KNEE ARTHROSCOPY Right     No Known Allergies  Social History   Social History  . Marital status: Married    Spouse name: N/A  . Number of children: N/A  . Years of education: N/A   Occupational History  . [employer]   Social History Main Topics  . Smoking status: Never Smoker  . Smokeless tobacco: Never Used  . Alcohol use No  . Drug use: No  . Sexual activity: Not on file   Other Topics Concern  . Not on file   Social History Narrative  . No narrative on file    Family History  Problem Relation Age of Onset  . Diabetes Mother   . Diabetes Father   . Hypertension Father   . Diabetes Maternal Grandfather   . Diabetes Paternal Grandfather   . Diabetes Brother   . Hypertension Brother     BP (!) 161/88   Pulse 81   Ht 6\' 3"  (1.905 m)   Wt 235 lb (106.6 kg)   BMI 29.37 kg/m   Review of Systems: See HPI above.     Objective:  Physical Exam:  Gen: NAD,  comfortable in exam room  Back: No gross deformity, scoliosis. TTP right lumbar paraspinal region.  No midline or bony TTP. FROM with pain on flexion. Strength LEs 5/5 all muscle groups.   2+ MSRs in patellar and achilles tendons, equal bilaterally. Negative SLRs. Sensation intact to light touch bilaterally. Negative logroll bilateral hips Negative fabers and piriformis stretches.   Assessment & Plan:  1. Low back injury - consistent with lumbar strain though getting some intermittent radiation into right leg - discussed flare of disc bulge/herniation also a possibility.  Discussed options - he would like to try voltaren with robaxin as needed.  Shown home exercises to do daily.  We discussed physical therapy, prednisone dose pack, MRI as well.  Call us in a week for an update on his  status.

## 2016-08-17 ENCOUNTER — Other Ambulatory Visit: Payer: Self-pay | Admitting: Internal Medicine

## 2016-08-31 ENCOUNTER — Other Ambulatory Visit: Payer: Self-pay | Admitting: Internal Medicine

## 2016-09-02 NOTE — Telephone Encounter (Signed)
Rx has been sent to the pharmacy electronically. ° °

## 2016-10-04 ENCOUNTER — Encounter: Payer: BLUE CROSS/BLUE SHIELD | Admitting: Internal Medicine

## 2016-10-11 ENCOUNTER — Encounter: Payer: Self-pay | Admitting: Internal Medicine

## 2016-10-11 ENCOUNTER — Other Ambulatory Visit (HOSPITAL_COMMUNITY): Payer: Self-pay | Admitting: Physician Assistant

## 2016-10-11 ENCOUNTER — Other Ambulatory Visit (INDEPENDENT_AMBULATORY_CARE_PROVIDER_SITE_OTHER): Payer: BLUE CROSS/BLUE SHIELD

## 2016-10-11 ENCOUNTER — Ambulatory Visit (INDEPENDENT_AMBULATORY_CARE_PROVIDER_SITE_OTHER): Payer: BLUE CROSS/BLUE SHIELD | Admitting: Internal Medicine

## 2016-10-11 VITALS — BP 140/70 | HR 72 | Temp 97.9°F | Resp 14 | Ht 75.0 in | Wt 239.0 lb

## 2016-10-11 DIAGNOSIS — I10 Essential (primary) hypertension: Secondary | ICD-10-CM | POA: Diagnosis not present

## 2016-10-11 DIAGNOSIS — N183 Chronic kidney disease, stage 3 unspecified: Secondary | ICD-10-CM

## 2016-10-11 DIAGNOSIS — Z0000 Encounter for general adult medical examination without abnormal findings: Secondary | ICD-10-CM

## 2016-10-11 DIAGNOSIS — E785 Hyperlipidemia, unspecified: Secondary | ICD-10-CM

## 2016-10-11 LAB — LIPID PANEL
CHOLESTEROL: 138 mg/dL (ref 0–200)
HDL: 40.4 mg/dL (ref >39.00)
LDL CALC: 81 mg/dL (ref 0–99)
NonHDL: 97.92
TRIGLYCERIDES: 83 mg/dL (ref 0.0–149.0)
Total CHOL/HDL Ratio: 3
VLDL: 16.6 mg/dL (ref 0.0–40.0)

## 2016-10-11 LAB — COMPREHENSIVE METABOLIC PANEL
ALBUMIN: 4.2 g/dL (ref 3.5–5.2)
ALK PHOS: 54 U/L (ref 39–117)
ALT: 22 U/L (ref 0–53)
AST: 21 U/L (ref 0–37)
BILIRUBIN TOTAL: 0.5 mg/dL (ref 0.2–1.2)
BUN: 36 mg/dL — AB (ref 6–23)
CALCIUM: 9 mg/dL (ref 8.4–10.5)
CHLORIDE: 105 meq/L (ref 96–112)
CO2: 28 mEq/L (ref 19–32)
CREATININE: 2.41 mg/dL — AB (ref 0.40–1.50)
GFR: 37.68 mL/min — ABNORMAL LOW (ref >60.00)
Glucose, Bld: 86 mg/dL (ref 70–99)
Potassium: 4 mEq/L (ref 3.5–5.1)
SODIUM: 139 meq/L (ref 135–145)
TOTAL PROTEIN: 7.3 g/dL (ref 6.0–8.3)

## 2016-10-11 NOTE — Progress Notes (Signed)
   Subjective:    Patient ID: Alexander [name], male    DOB: 1970/08/05, 46 y.o.   MRN: [id number]  HPI The patient is a 46 YO man coming in for wellness.   PMH, [facility], social history reviewed and updated.   Review of Systems  Constitutional: Negative.   HENT: Negative.   Eyes: Negative.   Respiratory: Negative for cough, chest tightness and shortness of breath.   Cardiovascular: Negative for chest pain, palpitations and leg swelling.  Gastrointestinal: Negative for abdominal distention, abdominal pain, constipation, diarrhea, nausea and vomiting.  Musculoskeletal: Negative.   Skin: Negative.   Neurological: Positive for numbness.       Neuropathy stable  Psychiatric/Behavioral: Negative.       Objective:   Physical Exam  Constitutional: He is oriented to person, place, and time. He appears well-developed and well-nourished.  HENT:  Head: Normocephalic and atraumatic.  Eyes: EOM are normal.  Neck: Normal range of motion.  Cardiovascular: Normal rate and regular rhythm.   Pulmonary/Chest: Effort normal and breath sounds normal. No respiratory distress. He has no wheezes. He has no rales.  Abdominal: Soft. Bowel sounds are normal. He exhibits no distension. There is no tenderness. There is no rebound.  Musculoskeletal: He exhibits no edema.  Neurological: He is alert and oriented to person, place, and time. Coordination normal.  Skin: Skin is warm and dry.  Psychiatric: He has a normal mood and affect.   Vitals:   10/11/16 1521  BP: 140/70  Pulse: 72  Resp: 14  Temp: 97.9 F (36.6 C)  TempSrc: Oral  SpO2: 100%  Weight: 239 lb (108.4 kg)  Height: 6\' 3"  (1.905 m)      Assessment & Plan:

## 2016-10-11 NOTE — Assessment & Plan Note (Signed)
BP at goal today on his hydralazine and coreg. Following with nephrology for his CKD. Checking CMP for stability.

## 2016-10-11 NOTE — Patient Instructions (Signed)
We are checking the labs today and will send you the results.    Health Maintenance, Male A healthy lifestyle and preventive care is important for your health and wellness. Ask your health care provider about what schedule of regular examinations is right for you. What should I know about weight and diet?  Eat a Healthy Diet  Eat plenty of vegetables, fruits, whole grains, low-fat dairy products, and lean protein.  Do not eat a lot of foods high in solid fats, added sugars, or salt. Maintain a Healthy Weight  Regular exercise can help you achieve or maintain a healthy weight. You should:  Do at least 150 minutes of exercise each week. The exercise should increase your heart rate and make you sweat (moderate-intensity exercise).  Do strength-training exercises at least twice a week. Watch Your Levels of Cholesterol and Blood Lipids  Have your blood tested for lipids and cholesterol every 5 years starting at 45 years of age. If you are at high risk for heart disease, you should start having your blood tested when you are 45 years old. You may need to have your cholesterol levels checked more often if:  Your lipid or cholesterol levels are high.  You are older than 45 years of age.  You are at high risk for heart disease. What should I know about cancer screening? Many types of cancers can be detected early and may often be prevented. Lung Cancer  You should be screened every year for lung cancer if:  You are a current smoker who has smoked for at least 30 years.  You are a former smoker who has quit within the past 15 years.  Talk to your health care provider about your screening options, when you should start screening, and how often you should be screened. Colorectal Cancer  Routine colorectal cancer screening usually begins at 45 years of age and should be repeated every 5-10 years until you are 45 years old. You may need to be screened more often if early forms of  precancerous polyps or small growths are found. Your health care provider may recommend screening at an earlier age if you have risk factors for colon cancer.  Your health care provider may recommend using home test kits to check for hidden blood in the stool.  A small camera at the end of a tube can be used to examine your colon (sigmoidoscopy or colonoscopy). This checks for the earliest forms of colorectal cancer. Prostate and Testicular Cancer  Depending on your age and overall health, your health care provider may do certain tests to screen for prostate and testicular cancer.  Talk to your health care provider about any symptoms or concerns you have about testicular or prostate cancer. Skin Cancer  Check your skin from head to toe regularly.  Tell your health care provider about any new moles or changes in moles, especially if:  There is a change in a mole's size, shape, or color.  You have a mole that is larger than a pencil eraser.  Always use sunscreen. Apply sunscreen liberally and repeat throughout the day.  Protect yourself by wearing long sleeves, pants, a wide-brimmed hat, and sunglasses when outside. What should I know about heart disease, diabetes, and high blood pressure?  If you are 54-11 years of age, have your blood pressure checked every 3-5 years. If you are 52 years of age or older, have your blood pressure checked every year. You should have your blood pressure measured twice-once when  you are at a hospital or clinic, and once when you are not at a hospital or clinic. Record the average of the two measurements. To check your blood pressure when you are not at a hospital or clinic, you can use:  An automated blood pressure machine at a pharmacy.  A home blood pressure monitor.  Talk to your health care provider about your target blood pressure.  If you are between 22-4 years old, ask your health care provider if you should take aspirin to prevent heart  disease.  Have regular diabetes screenings by checking your fasting blood sugar level.  If you are at a normal weight and have a low risk for diabetes, have this test once every three years after the age of 57.  If you are overweight and have a high risk for diabetes, consider being tested at a younger age or more often.  A one-time screening for abdominal aortic aneurysm (AAA) by ultrasound is recommended for men aged 102-75 years who are current or former smokers. What should I know about preventing infection? Hepatitis B  If you have a higher risk for hepatitis B, you should be screened for this virus. Talk with your health care provider to find out if you are at risk for hepatitis B infection. Hepatitis C  Blood testing is recommended for:  Everyone born from [birth year] through 1964.  Anyone with known risk factors for hepatitis C. Sexually Transmitted Diseases (STDs)  You should be screened each year for STDs including gonorrhea and chlamydia if:  You are sexually active and are younger than 45 years of age.  You are older than 45 years of age and your health care provider tells you that you are at risk for this type of infection.  Your sexual activity has changed since you were last screened and you are at an increased risk for chlamydia or gonorrhea. Ask your health care provider if you are at risk.  Talk with your health care provider about whether you are at high risk of being infected with HIV. Your health care provider may recommend a prescription medicine to help prevent HIV infection. What else can I do?  Schedule regular health, dental, and eye exams.  Stay current with your vaccines (immunizations).  Do not use any tobacco products, such as cigarettes, chewing tobacco, and e-cigarettes. If you need help quitting, ask your health care provider.  Limit alcohol intake to no more than 2 drinks per day. One drink equals 12 ounces of beer, 5 ounces of wine, or 1 ounces of hard  liquor.  Do not use street drugs.  Do not share needles.  Ask your health care provider for help if you need support or information about quitting drugs.  Tell your health care provider if you often feel depressed.  Tell your health care provider if you have ever been abused or do not feel safe at home. This information is not intended to replace advice given to you by your health care provider. Make sure you discuss any questions you have with your health care provider. Document Released: 12/21/2007 Document Revised: 02/21/2016 Document Reviewed: 03/28/2015 Elsevier Interactive Patient Education  2017 [employer].

## 2016-10-11 NOTE — Assessment & Plan Note (Signed)
Seeing nephrology and checking CMP today for stability. BP at goal and diabetes doing better.

## 2016-10-11 NOTE — Progress Notes (Signed)
Pre visit review using our clinic review tool, if applicable. No additional management support is needed unless otherwise documented below in the visit note. 

## 2016-10-11 NOTE — Assessment & Plan Note (Signed)
Checking lipid panel and adjust as needed his lipitor 40 mg daily.

## 2016-10-11 NOTE — Assessment & Plan Note (Signed)
Reminded to get eye exam, tetanus and flu and pneumonia shot up to date. Colonoscopy due at 19. Counseled about the importance of adherence to his medications to avoid progression of disease. Counseled about the dangers of distracted driving. Given screening recommendations.

## 2016-11-11 ENCOUNTER — Ambulatory Visit (INDEPENDENT_AMBULATORY_CARE_PROVIDER_SITE_OTHER): Payer: BLUE CROSS/BLUE SHIELD | Admitting: Endocrinology

## 2016-11-11 ENCOUNTER — Encounter: Payer: Self-pay | Admitting: Endocrinology

## 2016-11-11 VITALS — BP 146/80 | HR 66 | Ht 75.0 in | Wt 240.0 lb

## 2016-11-11 DIAGNOSIS — Z794 Long term (current) use of insulin: Secondary | ICD-10-CM

## 2016-11-11 DIAGNOSIS — N183 Chronic kidney disease, stage 3 unspecified: Secondary | ICD-10-CM

## 2016-11-11 DIAGNOSIS — E1122 Type 2 diabetes mellitus with diabetic chronic kidney disease: Secondary | ICD-10-CM | POA: Diagnosis not present

## 2016-11-11 LAB — POCT GLYCOSYLATED HEMOGLOBIN (HGB A1C): Hemoglobin A1C: 6.4

## 2016-11-11 MED ORDER — INSULIN DETEMIR 100 UNIT/ML FLEXPEN: 70.0000 [IU] | PEN_INJECTOR | SUBCUTANEOUS | 11 refills | Status: DC

## 2016-11-11 NOTE — Progress Notes (Signed)
Subjective:    Patient ID: Alexander [name], male    DOB: 03-20-1971, 46 y.o.   MRN: [id number]  HPI Pt returns for f/u of diabetes mellitus: DM type: insulin-requiring type 2 Dx'ed: 0254 Complications: polyneuropathy, renal insufficiency, and retinopathy.   Therapy: insulin since 2013.   DKA: never.  Severe hypoglycemia: never.   Pancreatitis: never.  Other: he declines multiple daily injections.  He declines weight loss surgery.   Interval history: no cbg record, but states cbg's vary from 86-140.  There is no trend throughout the day.  pt states he feels well in general.   Past Medical History:  Diagnosis Date  . CHEST PAIN 08/28/2009  . DIABETES MELLITUS, TYPE II 03/20/2007  . HYPERCHOLESTEROLEMIA 07/21/2008  . HYPERTENSION 03/20/2007  . Hypertensive urgency 03/27/2015  . Left ventricular dysfunction 03/28/2015   EF 40-45%, no WMA, grade 2 diastolic dysfunction    Past Surgical History:  Procedure Laterality Date  . KNEE ARTHROSCOPY Right     Social History   Social History  . Marital status: Married    Spouse name: N/A  . Number of children: N/A  . Years of education: N/A   Occupational History  . [employer]   Social History Main Topics  . Smoking status: Never Smoker  . Smokeless tobacco: Never Used  . Alcohol use No  . Drug use: No  . Sexual activity: Not on file   Other Topics Concern  . Not on file   Social History Narrative  . No narrative on file    Current Outpatient Prescriptions on File Prior to Visit  Medication Sig Dispense Refill  . aspirin EC 81 MG EC tablet Take 1 tablet (81 mg total) by mouth daily.    Marland Kitchen atorvastatin (LIPITOR) 40 MG tablet TAKE 1 TABLET (40 MG TOTAL) BY MOUTH DAILY AT 6 PM. 90 tablet 3  . BD INSULIN SYRINGE ULTRAFINE 31G X 15/64" 1 ML MISC USE WITH INSULIN DAILY 100 each 3  . carvedilol (COREG) 12.5 MG tablet TAKE 1 TABLET (12.5 MG TOTAL) BY MOUTH 2 (TWO) TIMES DAILY WITH A MEAL. 180 tablet 2  . FREESTYLE LITE test  strip USE 2 TIMES DAILY 100 each 2  . hydrALAZINE (APRESOLINE) 100 MG tablet TAKE 1 TABLET BY MOUTH 3 TIMES A DAY 90 tablet 5  . methocarbamol (ROBAXIN) 500 MG tablet Take 1 tablet (500 mg total) by mouth every 8 (eight) hours as needed. 60 tablet 1  . nitroGLYCERIN (NITROSTAT) 0.4 MG SL tablet Place 1 tablet (0.4 mg total) under the tongue every 5 (five) minutes x 3 doses as needed for chest pain. Don't take within 24 hrs of Cialis 25 tablet 3  . tadalafil (CIALIS) 20 MG tablet Take 1 tablet (20 mg total) by mouth daily as needed for erectile dysfunction. 10 tablet 11  . Vitamin D, Ergocalciferol, (DRISDOL) 50000 units CAPS capsule     . gabapentin (NEURONTIN) 300 MG capsule      No current facility-administered medications on file prior to visit.     No Known Allergies  Family History  Problem Relation Age of Onset  . Diabetes Mother   . Diabetes Father   . Hypertension Father   . Diabetes Maternal Grandfather   . Diabetes Paternal Grandfather   . Diabetes Brother   . Hypertension Brother     BP (!) 146/80   Pulse 66   Ht 6\' 3"  (1.905 m)   Wt 240 lb (108.9 kg)   SpO2  98%   BMI 30.00 kg/m    Review of Systems He denies hypoglycemia.     Objective:   Physical Exam VITAL SIGNS:  See vs page GENERAL: no distress Pulses: dorsalis pedis intact bilat.   MSK: no deformity of the feet.  CV: no leg edema.  Skin:  no ulcer on the feet.  normal color and temp on the feet.  Neuro: sensation is intact to touch on the feet.   Ext: There is bilateral onychomycosis of the toenails.  left great toenail is absent.    Lab Results  Component Value Date   CREATININE 2.41 (H) 10/11/2016   BUN 36 (H) 10/11/2016   NA 139 10/11/2016   K 4.0 10/11/2016   CL 105 10/11/2016   CO2 28 10/11/2016   Lab Results  Component Value Date   HGBA1C 6.4 11/11/2016      Assessment & Plan:  Insulin-requiring type 2 DM, with renal failure: overcontrolled, given this regimen, which does match  insulin to his changing needs throughout the day HTN: persistent.  Patient Instructions  check your blood sugar 2 times a day.  vary the time of day when you check, between before the 3 meals, and at bedtime.  also check if you have symptoms of your blood sugar being too high or too low.  please keep a record of the readings and bring it to your next appointment here.  please call us sooner if your blood sugar goes below 70, or if you have a lot of readings over 200.   Please come back for a follow-up appointment in 4 months.    Please reduce the levemir to 70 units each morning.    On this type of insulin schedule, you should eat meals on a regular schedule.  If a meal is missed or significantly delayed, your blood sugar could go low.   Your blood pressure is high today. Please see your kidney doctor soon, to recheck.

## 2016-11-11 NOTE — Patient Instructions (Addendum)
check your blood sugar 2 times a day.  vary the time of day when you check, between before the 3 meals, and at bedtime.  also check if you have symptoms of your blood sugar being too high or too low.  please keep a record of the readings and bring it to your next appointment here.  please call us sooner if your blood sugar goes below 70, or if you have a lot of readings over 200.   Please come back for a follow-up appointment in 4 months.    Please reduce the levemir to 70 units each morning.    On this type of insulin schedule, you should eat meals on a regular schedule.  If a meal is missed or significantly delayed, your blood sugar could go low.   Your blood pressure is high today. Please see your kidney doctor soon, to recheck.

## 2016-11-14 DIAGNOSIS — M791 Myalgia: Secondary | ICD-10-CM | POA: Diagnosis not present

## 2016-11-14 DIAGNOSIS — E1122 Type 2 diabetes mellitus with diabetic chronic kidney disease: Secondary | ICD-10-CM | POA: Diagnosis not present

## 2016-11-14 DIAGNOSIS — N179 Acute kidney failure, unspecified: Secondary | ICD-10-CM | POA: Insufficient documentation

## 2016-11-14 DIAGNOSIS — I129 Hypertensive chronic kidney disease with stage 1 through stage 4 chronic kidney disease, or unspecified chronic kidney disease: Secondary | ICD-10-CM | POA: Diagnosis not present

## 2016-11-22 DIAGNOSIS — N183 Chronic kidney disease, stage 3 (moderate): Secondary | ICD-10-CM | POA: Diagnosis not present

## 2016-11-26 ENCOUNTER — Other Ambulatory Visit: Payer: Self-pay | Admitting: Endocrinology

## 2017-01-09 ENCOUNTER — Ambulatory Visit (INDEPENDENT_AMBULATORY_CARE_PROVIDER_SITE_OTHER): Payer: BLUE CROSS/BLUE SHIELD | Admitting: Internal Medicine

## 2017-01-09 ENCOUNTER — Encounter: Payer: Self-pay | Admitting: Internal Medicine

## 2017-01-09 VITALS — BP 135/78 | HR 70 | Ht 75.0 in | Wt 243.0 lb

## 2017-01-09 DIAGNOSIS — R0989 Other specified symptoms and signs involving the circulatory and respiratory systems: Secondary | ICD-10-CM | POA: Diagnosis not present

## 2017-01-09 DIAGNOSIS — N179 Acute kidney failure, unspecified: Secondary | ICD-10-CM | POA: Diagnosis not present

## 2017-01-09 DIAGNOSIS — I110 Hypertensive heart disease with heart failure: Secondary | ICD-10-CM | POA: Diagnosis not present

## 2017-01-09 NOTE — Progress Notes (Signed)
Cardiology Office Note   Date:  01/09/2017   ID:  Alexander [name], DOB May 19, 1971, MRN [id number]  PCP:  Hoyt Koch, MD  Cardiologist:  Dr Charma Igo, MD   Chief Complaint  Patient presents with  . Follow-up    6 months;  No complaints  History of Present Illness: Alexander [name] is a 46 y.o. male with a history of DM, HL, HTN, d/c 09/22 after hospitalization for atyp CP, hypertensive urgency, EF 40-45% by echo w/ gr 2 DD, MV w/ small defect EF 33%. ARF on CKD. He was recently seen by [name] and medications were adjusted. It was noted that his creatinine had risen and he was taken off of ACE inhibitor and a increase of his carvedilol was made. His blood pressure today is very well controlled in the office at 122/76, however I reviewed a blood pressure log from home which indicated varying blood pressures between 932I and 712W systolic as well as diastolics between 90 and 580 at times. This suggests that it may not be as well controlled as we suspect. Is not clear what would be causing his labile blood pressures. He is on 3 times a day hydralazine which he takes without fail. There could be a waning effect of the medication. He does have some chronic kidney disease and another possibility could be some degree of fibromuscular dysplasia or renal artery stenosis. I do not believe this is been evaluated. Of note he had a repeat echocardiogram pleased to say that his EF is now come back to 55-60%. He did have a low risk Myoview in the hospital, but his ejection fraction was reduced. Currently he is asymptomatic and denies chest pain or worsening shortness of breath.  07/12/2016  Alexander [name] returns today for follow-up. He reports feeling well and generally feels like his blood pressure is somewhat elevated although recently saw a nephrologist in [facility] and he felt like his blood pressure was well controlled. Today's blood pressure is 157/82, however I rechecked  the blood pressure and it came back at 120/76. We then checked his blood pressure with his home cuff which was 135/76. There may be a slight discrepancy with a favor to higher blood pressure of his home cuff. In general I feel that his blood pressure control is very good and may need to be tweaked somewhat.  01/10/2079  Alexander [name] was seen today in follow-up. Overall he feels well. He denies a chest pain or worsening shortness of breath. He has had interim improvement in LVEF back to normal. His blood pressure has been better controlled. Today's 135/78. He's working with his nephrologist. Creatinine is recently 2.2. He has been complaining of some heaviness in his leg when he walks. Is not necessarily crampy pain or burning. It comes and goes with exertion without. He does have a history of some peripheral neuropathy and was supposed to be on gabapentin but has not taken it due to some fear of the medication. He is on a atorvastatin. His most recent lipid profile shows an LDL-C of 81, but previously he was well controlled at 61.  Past Medical History:  Diagnosis Date  . CHEST PAIN 08/28/2009  . DIABETES MELLITUS, TYPE II 03/20/2007  . HYPERCHOLESTEROLEMIA 07/21/2008  . HYPERTENSION 03/20/2007  . Hypertensive urgency 03/27/2015  . Left ventricular dysfunction 03/28/2015   EF 40-45%, no WMA, grade 2 diastolic dysfunction    Past Surgical History:  Procedure Laterality Date  .  KNEE ARTHROSCOPY Right     Current Outpatient Prescriptions  Medication Sig Dispense Refill  . aspirin EC 81 MG EC tablet Take 1 tablet (81 mg total) by mouth daily.    Marland Kitchen atorvastatin (LIPITOR) 40 MG tablet Take 1 tablet (40 mg total) by mouth daily at 6 PM. 90 tablet 3  . carvedilol (COREG) 6.25 MG tablet Take 1 tablet (6.25 mg total) by mouth 2 (two) times daily with a meal. 60 tablet 5  . hydrALAZINE (APRESOLINE) 100 MG tablet Take 1 tablet (100 mg total) by mouth every 8 (eight) hours. 90 tablet 5  . insulin detemir  (LEVEMIR) 100 UNIT/ML injection Inject 2 mLs (200 Units total) into the skin every morning. (Patient taking differently: Inject 200 Units into the skin every morning. Patient states he is taking 200 units daily per patient) 70 mL 11  . lisinopril (PRINIVIL,ZESTRIL) 20 MG tablet Take 1 tablet (20 mg total) by mouth daily. 90 tablet 3  . nitroGLYCERIN (NITROSTAT) 0.4 MG SL tablet Place 1 tablet (0.4 mg total) under the tongue every 5 (five) minutes x 3 doses as needed for chest pain. Don't take within 24 hrs of Cialis 25 tablet 3  . tadalafil (CIALIS) 20 MG tablet Take 1 tablet (20 mg total) by mouth daily as needed for erectile dysfunction. 10 tablet 11   No current facility-administered medications for this visit.    Allergies:   Patient has no known allergies.    Social History:  The patient  reports that he has never smoked. He has never used smokeless tobacco. He reports that he does not drink alcohol or use drugs.   Family History:  The patient's family history includes Diabetes in his brother, father, maternal grandfather, mother, and paternal grandfather; Hypertension in his brother and father.    ROS:  Please see the history of present illness. All other systems are reviewed and negative.    PHYSICAL EXAM: VS:  BP 135/78   Pulse 70   Ht 6\' 3"  (1.905 m)   Wt 243 lb (110.2 kg)   BMI 30.37 kg/m  , BMI Body mass index is 30.37 kg/m. General appearance: alert and no distress Neck: no carotid bruit, no JVD and thyroid not enlarged, symmetric, no tenderness/mass/nodules Lungs: clear to auscultation bilaterally Heart: regular rate and rhythm, S1, S2 normal, no murmur, click, rub or gallop Abdomen: soft, non-tender; bowel sounds normal; no masses,  no organomegaly Extremities: extremities normal, atraumatic, no cyanosis or edema Pulses: 2+ and symmetric Skin: Skin color, texture, turgor normal. No rashes or lesions Neurologic: Grossly normal Psych: Pleasant  EKG:   Sinus rhythm  at 70, nonspecific T wave changes-personally reviewed  Recent Labs: 10/11/2016: ALT 22; BUN 36; Creatinine, Ser 2.41; Potassium 4.0; Sodium 139    Lipid Panel    Component Value Date/Time   CHOL 138 10/11/2016 1545   TRIG 83.0 10/11/2016 1545   HDL 40.40 10/11/2016 1545   CHOLHDL 3 10/11/2016 1545   VLDL 16.6 10/11/2016 1545   LDLCALC 81 10/11/2016 1545   LDLDIRECT 164.0 07/15/2014 1703     Wt Readings from Last 3 Encounters:  01/09/17 243 lb (110.2 kg)  11/11/16 240 lb (108.9 kg)  10/11/16 239 lb (108.4 kg)     Other studies Reviewed: Additional studies/ records that were reviewed today include: Hospital records, old ECGs.  ASSESSMENT:  1. Type 2 diabetes on insulin 2. Labile hypertension 3. Recent systolic congestive heart failure with improvement in LVEF to 55-60% 4. Hypertensive heart  disease with heart failure 5. Hypertensive nephropathy - CKD3  PLAN: 1. Mr. Hagedorn Seems to have stable blood pressures. LVEF has improved back to normal. He has C Cady 3 with a creatinine of 2.2 recently is followed by nephrologist with [facility]. His LDL is slightly higher than it had been a few months ago will need to work on diet and some weight loss to help with that. He is reporting some leg heaviness with exertion. This could be related to neuropathy or possibly side effect from his atorvastatin. I would advise a two-week statin holiday to see if his symptoms improve. If they do not then he should be restarted on his atorvastatin. Follow-up with me annually or sooner as necessary.   [name], MD, [facility] Attending Cardiologist [place], MD  01/09/2017 3:35 PM

## 2017-01-09 NOTE — Patient Instructions (Signed)
Your physician wants you to follow-up in: ONE YEAR with Dr. Debara Pickett. You will receive a reminder letter in the mail two months in advance. If you don't receive a letter, please call our office to schedule the follow-up appointment.  HOLD atorvastatin for 2 weeks -- please call our office with an update on symptoms

## 2017-02-24 DIAGNOSIS — N183 Chronic kidney disease, stage 3 (moderate): Secondary | ICD-10-CM | POA: Diagnosis not present

## 2017-02-24 DIAGNOSIS — E889 Metabolic disorder, unspecified: Secondary | ICD-10-CM | POA: Diagnosis not present

## 2017-02-24 DIAGNOSIS — M9080 Osteopathy in diseases classified elsewhere, unspecified site: Secondary | ICD-10-CM | POA: Diagnosis not present

## 2017-02-24 DIAGNOSIS — I129 Hypertensive chronic kidney disease with stage 1 through stage 4 chronic kidney disease, or unspecified chronic kidney disease: Secondary | ICD-10-CM | POA: Diagnosis not present

## 2017-02-24 DIAGNOSIS — D631 Anemia in chronic kidney disease: Secondary | ICD-10-CM | POA: Diagnosis not present

## 2017-02-24 DIAGNOSIS — E559 Vitamin D deficiency, unspecified: Secondary | ICD-10-CM | POA: Diagnosis not present

## 2017-02-26 DIAGNOSIS — E889 Metabolic disorder, unspecified: Secondary | ICD-10-CM | POA: Diagnosis not present

## 2017-02-26 DIAGNOSIS — E1122 Type 2 diabetes mellitus with diabetic chronic kidney disease: Secondary | ICD-10-CM | POA: Diagnosis not present

## 2017-02-26 DIAGNOSIS — I129 Hypertensive chronic kidney disease with stage 1 through stage 4 chronic kidney disease, or unspecified chronic kidney disease: Secondary | ICD-10-CM | POA: Diagnosis not present

## 2017-02-26 DIAGNOSIS — N183 Chronic kidney disease, stage 3 (moderate): Secondary | ICD-10-CM | POA: Diagnosis not present

## 2017-04-03 ENCOUNTER — Other Ambulatory Visit: Payer: Self-pay | Admitting: Internal Medicine

## 2017-04-04 ENCOUNTER — Observation Stay (HOSPITAL_BASED_OUTPATIENT_CLINIC_OR_DEPARTMENT_OTHER)
Admission: EM | Admit: 2017-04-04 | Discharge: 2017-04-05 | Disposition: A | Discharge status: Home / Self Care | Payer: BLUE CROSS/BLUE SHIELD | Attending: Internal Medicine | Admitting: Internal Medicine

## 2017-04-04 ENCOUNTER — Emergency Department (HOSPITAL_BASED_OUTPATIENT_CLINIC_OR_DEPARTMENT_OTHER): Payer: BLUE CROSS/BLUE SHIELD

## 2017-04-04 ENCOUNTER — Encounter (HOSPITAL_BASED_OUTPATIENT_CLINIC_OR_DEPARTMENT_OTHER): Payer: Self-pay | Admitting: *Deleted

## 2017-04-04 DIAGNOSIS — N289 Disorder of kidney and ureter, unspecified: Secondary | ICD-10-CM

## 2017-04-04 DIAGNOSIS — Z79899 Other long term (current) drug therapy: Secondary | ICD-10-CM | POA: Diagnosis not present

## 2017-04-04 DIAGNOSIS — R079 Chest pain, unspecified: Secondary | ICD-10-CM | POA: Diagnosis present

## 2017-04-04 DIAGNOSIS — N183 Chronic kidney disease, stage 3 unspecified: Secondary | ICD-10-CM | POA: Diagnosis present

## 2017-04-04 DIAGNOSIS — Z794 Long term (current) use of insulin: Secondary | ICD-10-CM

## 2017-04-04 DIAGNOSIS — I160 Hypertensive urgency: Secondary | ICD-10-CM | POA: Diagnosis not present

## 2017-04-04 DIAGNOSIS — N184 Chronic kidney disease, stage 4 (severe): Secondary | ICD-10-CM | POA: Diagnosis present

## 2017-04-04 DIAGNOSIS — R0789 Other chest pain: Secondary | ICD-10-CM | POA: Diagnosis not present

## 2017-04-04 DIAGNOSIS — D631 Anemia in chronic kidney disease: Secondary | ICD-10-CM | POA: Insufficient documentation

## 2017-04-04 DIAGNOSIS — D649 Anemia, unspecified: Secondary | ICD-10-CM

## 2017-04-04 DIAGNOSIS — I129 Hypertensive chronic kidney disease with stage 1 through stage 4 chronic kidney disease, or unspecified chronic kidney disease: Secondary | ICD-10-CM | POA: Diagnosis present

## 2017-04-04 DIAGNOSIS — I130 Hypertensive heart and chronic kidney disease with heart failure and stage 1 through stage 4 chronic kidney disease, or unspecified chronic kidney disease: Secondary | ICD-10-CM | POA: Diagnosis not present

## 2017-04-04 DIAGNOSIS — E7800 Pure hypercholesterolemia, unspecified: Secondary | ICD-10-CM | POA: Insufficient documentation

## 2017-04-04 DIAGNOSIS — Z7982 Long term (current) use of aspirin: Secondary | ICD-10-CM | POA: Insufficient documentation

## 2017-04-04 DIAGNOSIS — E1122 Type 2 diabetes mellitus with diabetic chronic kidney disease: Secondary | ICD-10-CM | POA: Diagnosis not present

## 2017-04-04 DIAGNOSIS — E785 Hyperlipidemia, unspecified: Secondary | ICD-10-CM | POA: Insufficient documentation

## 2017-04-04 DIAGNOSIS — I169 Hypertensive crisis, unspecified: Secondary | ICD-10-CM

## 2017-04-04 DIAGNOSIS — Z23 Encounter for immunization: Secondary | ICD-10-CM | POA: Insufficient documentation

## 2017-04-04 DIAGNOSIS — E0821 Diabetes mellitus due to underlying condition with diabetic nephropathy: Secondary | ICD-10-CM

## 2017-04-04 DIAGNOSIS — I10 Essential (primary) hypertension: Secondary | ICD-10-CM | POA: Diagnosis not present

## 2017-04-04 HISTORY — DX: Disorder of kidney and ureter, unspecified: N28.9

## 2017-04-04 LAB — URINALYSIS, ROUTINE W REFLEX MICROSCOPIC
Bilirubin Urine: NEGATIVE
Glucose, UA: NEGATIVE mg/dL
Hgb urine dipstick: NEGATIVE
Ketones, ur: NEGATIVE mg/dL
LEUKOCYTES UA: NEGATIVE
Nitrite: NEGATIVE
PROTEIN: NEGATIVE mg/dL
Specific Gravity, Urine: 1.015 (ref 1.005–1.030)
pH: 5.5 (ref 5.0–8.0)

## 2017-04-04 LAB — BASIC METABOLIC PANEL
Anion gap: 9 (ref 5–15)
BUN: 37 mg/dL — AB (ref 6–20)
CHLORIDE: 105 mmol/L (ref 101–111)
CO2: 23 mmol/L (ref 22–32)
Calcium: 9 mg/dL (ref 8.9–10.3)
Creatinine, Ser: 2.42 mg/dL — ABNORMAL HIGH (ref 0.61–1.24)
GFR calc non Af Amer: 31 mL/min — ABNORMAL LOW (ref >60)
GFR, EST AFRICAN AMERICAN: 36 mL/min — AB (ref >60)
Glucose, Bld: 110 mg/dL — ABNORMAL HIGH (ref 65–99)
POTASSIUM: 3.8 mmol/L (ref 3.5–5.1)
Sodium: 137 mmol/L (ref 135–145)

## 2017-04-04 LAB — CBC WITH DIFFERENTIAL/PLATELET
BASOS ABS: 0.1 10*3/uL (ref 0.0–0.1)
BASOS PCT: 1 %
Eosinophils Absolute: 0.5 10*3/uL (ref 0.0–0.7)
Eosinophils Relative: 7 %
HEMATOCRIT: 33.3 % — AB (ref 39.0–52.0)
HEMOGLOBIN: 10.9 g/dL — AB (ref 13.0–17.0)
Lymphocytes Relative: 37 %
Lymphs Abs: 2.8 10*3/uL (ref 0.7–4.0)
MCH: 27.1 pg (ref 26.0–34.0)
MCHC: 32.7 g/dL (ref 30.0–36.0)
MCV: 82.8 fL (ref 78.0–100.0)
Monocytes Absolute: 0.6 10*3/uL (ref 0.1–1.0)
Monocytes Relative: 8 %
NEUTROS ABS: 3.5 10*3/uL (ref 1.7–7.7)
NEUTROS PCT: 47 %
Platelets: 262 10*3/uL (ref 150–400)
RBC: 4.02 MIL/uL — ABNORMAL LOW (ref 4.22–5.81)
RDW: 13.2 % (ref 11.5–15.5)
WBC: 7.5 10*3/uL (ref 4.0–10.5)

## 2017-04-04 LAB — CBG MONITORING, ED: Glucose-Capillary: 135 mg/dL — ABNORMAL HIGH (ref 65–99)

## 2017-04-04 LAB — TROPONIN I

## 2017-04-04 LAB — GLUCOSE, CAPILLARY: GLUCOSE-CAPILLARY: 128 mg/dL — AB (ref 65–99)

## 2017-04-04 MED ORDER — HYDRALAZINE HCL 50 MG PO TABS
100.0000 mg | ORAL_TABLET | Freq: Three times a day (TID) | ORAL | Status: DC
  Administered 2017-04-04 – 2017-04-05 (×2): 100 mg via ORAL
  Filled 2017-04-04 (×2): qty 2

## 2017-04-04 MED ORDER — ACETAMINOPHEN 500 MG PO TABS
1000.0000 mg | ORAL_TABLET | Freq: Once | ORAL | Status: AC
  Administered 2017-04-04: 1000 mg via ORAL
  Filled 2017-04-04: qty 2

## 2017-04-04 MED ORDER — ASPIRIN 81 MG PO CHEW
81.0000 mg | CHEWABLE_TABLET | Freq: Every day | ORAL | Status: DC
  Administered 2017-04-05: 81 mg via ORAL
  Filled 2017-04-04: qty 1

## 2017-04-04 MED ORDER — ONDANSETRON HCL 4 MG/2ML IJ SOLN: 4.0000 mg | Freq: Four times a day (QID) | INTRAMUSCULAR | Status: DC | PRN

## 2017-04-04 MED ORDER — ATORVASTATIN CALCIUM 40 MG PO TABS: 40.0000 mg | ORAL_TABLET | Freq: Every day | ORAL | Status: DC

## 2017-04-04 MED ORDER — LIDOCAINE 5 % EX PTCH
1.0000 | MEDICATED_PATCH | Freq: Every day | CUTANEOUS | Status: DC | PRN
Start: 2017-04-04 — End: 2017-04-05

## 2017-04-04 MED ORDER — INSULIN ASPART 100 UNIT/ML ~~LOC~~ SOLN
0.0000 [IU] | Freq: Three times a day (TID) | SUBCUTANEOUS | Status: DC
  Administered 2017-04-05: 2 [IU] via SUBCUTANEOUS
  Administered 2017-04-05: 1 [IU] via SUBCUTANEOUS

## 2017-04-04 MED ORDER — ACETAMINOPHEN 325 MG PO TABS: 650.0000 mg | ORAL_TABLET | ORAL | Status: DC | PRN

## 2017-04-04 MED ORDER — INSULIN DETEMIR 100 UNIT/ML ~~LOC~~ SOLN
60.0000 [IU] | Freq: Every day | SUBCUTANEOUS | Status: DC
  Administered 2017-04-05: 60 [IU] via SUBCUTANEOUS
  Filled 2017-04-04: qty 0.6

## 2017-04-04 MED ORDER — NITROGLYCERIN IN D5W 200-5 MCG/ML-% IV SOLN: 0.0000 ug/min | INTRAVENOUS | Status: DC

## 2017-04-04 MED ORDER — ZOLPIDEM TARTRATE 5 MG PO TABS: 5.0000 mg | ORAL_TABLET | Freq: Every evening | ORAL | Status: DC | PRN

## 2017-04-04 MED ORDER — MORPHINE SULFATE (PF) 2 MG/ML IV SOLN: 2.0000 mg | INTRAVENOUS | Status: DC | PRN

## 2017-04-04 MED ORDER — ACETAMINOPHEN 325 MG PO TABS
650.0000 mg | ORAL_TABLET | Freq: Once | ORAL | Status: AC
  Administered 2017-04-04: 650 mg via ORAL
  Filled 2017-04-04: qty 2

## 2017-04-04 MED ORDER — HYDRALAZINE HCL 20 MG/ML IJ SOLN
10.0000 mg | INTRAMUSCULAR | Status: DC | PRN
Start: 2017-04-04 — End: 2017-04-05
  Administered 2017-04-05: 10 mg via INTRAVENOUS
  Filled 2017-04-04: qty 1

## 2017-04-04 MED ORDER — HEPARIN SODIUM (PORCINE) 5000 UNIT/ML IJ SOLN
5000.0000 [IU] | Freq: Three times a day (TID) | INTRAMUSCULAR | Status: DC
  Administered 2017-04-04 – 2017-04-05 (×2): 5000 [IU] via SUBCUTANEOUS
  Filled 2017-04-04 (×2): qty 1

## 2017-04-04 MED ORDER — NITROGLYCERIN IN D5W 200-5 MCG/ML-% IV SOLN
5.0000 ug/min | Freq: Once | INTRAVENOUS | Status: AC
  Administered 2017-04-04: 5 ug/min via INTRAVENOUS

## 2017-04-04 MED ORDER — HYDROCODONE-ACETAMINOPHEN 5-325 MG PO TABS: 1.0000 | ORAL_TABLET | ORAL | Status: DC | PRN

## 2017-04-04 MED ORDER — INFLUENZA VAC SPLIT QUAD 0.5 ML IM SUSY
0.5000 mL | PREFILLED_SYRINGE | INTRAMUSCULAR | Status: AC
  Administered 2017-04-05: 0.5 mL via INTRAMUSCULAR
  Filled 2017-04-04: qty 0.5

## 2017-04-04 MED ORDER — CARVEDILOL 12.5 MG PO TABS
12.5000 mg | ORAL_TABLET | Freq: Two times a day (BID) | ORAL | Status: DC
  Administered 2017-04-04 – 2017-04-05 (×2): 12.5 mg via ORAL
  Filled 2017-04-04 (×2): qty 1

## 2017-04-04 NOTE — ED Triage Notes (Signed)
C/o rt sided cp radiating to rt back onset yesterday 1700,  Burning  Pain increased w touch

## 2017-04-04 NOTE — ED Notes (Signed)
Returned from xray

## 2017-04-04 NOTE — ED Notes (Signed)
ED Provider at bedside. 

## 2017-04-04 NOTE — ED Notes (Signed)
Phylise Barlett Pt's wife please call with bed  update. [phone]

## 2017-04-04 NOTE — ED Provider Notes (Signed)
[facility] DEPT [facility] Provider Note   CSN: [id number] Arrival date & time: 04/04/17  0319     History   Chief Complaint Chief Complaint  Patient presents with  . Chest Pain    HPI Alexander [name] is a 46 y.o. male.  The history is provided by the patient.  Chest Pain   This is a new problem. The current episode started 3 to 5 hours ago. The problem occurs constantly. The problem has been rapidly worsening. The pain is associated with rest. The pain is present in the lateral region. The pain is moderate. The quality of the pain is described as dull. Pertinent negatives include no abdominal pain, no diaphoresis, no hemoptysis, no lower extremity edema, no palpitations, no shortness of breath and no vomiting. Treatments tried: aspirin. The treatment provided no relief. Risk factors include male gender and obesity (diabetes hypertension renal disease).  Pertinent negatives for past medical history include no PE and no recent injury.    Past Medical History:  Diagnosis Date  . CHEST PAIN 08/28/2009  . DIABETES MELLITUS, TYPE II 03/20/2007  . HYPERCHOLESTEROLEMIA 07/21/2008  . HYPERTENSION 03/20/2007  . Hypertensive urgency 03/27/2015  . Left ventricular dysfunction 03/28/2015   EF 40-45%, no WMA, grade 2 diastolic dysfunction  . Renal disorder     Patient Active Problem List   Diagnosis Date Noted  . Routine general medical examination at a health care facility 10/11/2016  . Labile hypertension 07/12/2016  . Vitamin D deficiency 11/09/2015  . CKD (chronic kidney disease) stage 3, GFR 30-59 ml/min 09/29/2015  . Diabetic nephropathy ([place]) 03/29/2015  . Type 2 diabetes mellitus with diabetic chronic kidney disease ([place]) 03/29/2015  . Acute renal failure ([place])   . Dyslipidemia   . Hypertensive heart disease with heart failure ([place]) 01/20/2015  . Hypogonadism male 05/27/2012  . Numbness 05/26/2012  . Essential hypertension 03/20/2007    Past Surgical History:  Procedure  Laterality Date  . KNEE ARTHROSCOPY Right        Home Medications    Prior to Admission medications   Medication Sig Start Date End Date Taking? Authorizing Provider  aspirin EC 81 MG EC tablet Take 1 tablet (81 mg total) by mouth daily. 03/30/15   Almyra Deforest, PA  atorvastatin (LIPITOR) 40 MG tablet TAKE 1 TABLET (40 MG TOTAL) BY MOUTH DAILY AT 6 PM. 07/26/16   Hilty, Nadean Corwin, MD  BD INSULIN SYRINGE ULTRAFINE 31G X 15/64" 1 ML MISC USE WITH INSULIN DAILY 02/27/16   Renato Shin, MD  carvedilol (COREG) 12.5 MG tablet TAKE 1 TABLET (12.5 MG TOTAL) BY MOUTH 2 (TWO) TIMES DAILY WITH A MEAL. 09/02/16   [name], MD  FREESTYLE LITE test strip USE 2 TIMES DAILY 11/26/16   Renato Shin, MD  gabapentin (NEURONTIN) 300 MG capsule  07/25/16   [provider]  hydrALAZINE (APRESOLINE) 100 MG tablet TAKE 1 TABLET BY MOUTH 3 TIMES A DAY 04/03/17   Hilty, Nadean Corwin, MD  Insulin Detemir (LEVEMIR FLEXTOUCH) 100 UNIT/ML Pen Inject 70 Units into the skin every morning. And pen needles 2/day 11/11/16   Renato Shin, MD  methocarbamol (ROBAXIN) 500 MG tablet Take 1 tablet (500 mg total) by mouth every 8 (eight) hours as needed. 08/13/16   Hudnall, Sharyn Lull, MD  nitroGLYCERIN (NITROSTAT) 0.4 MG SL tablet Place 1 tablet (0.4 mg total) under the tongue every 5 (five) minutes x 3 doses as needed for chest pain. Don't take within 24 hrs of Cialis  03/30/15   Almyra Deforest, PA  tadalafil (CIALIS) 20 MG tablet Take 1 tablet (20 mg total) by mouth daily as needed for erectile dysfunction. 07/15/14   Renato Shin, MD  Vitamin D, Ergocalciferol, (DRISDOL) 50000 units CAPS capsule  07/05/16   [provider]    Family History Family History  Problem Relation Age of Onset  . Diabetes Mother   . Diabetes Father   . Hypertension Father   . Diabetes Maternal Grandfather   . Diabetes Paternal Grandfather   . Diabetes Brother   . Hypertension Brother     Social History Social History  Substance Use  Topics  . Smoking status: Never Smoker  . Smokeless tobacco: Never Used  . Alcohol use No     Allergies   Patient has no known allergies.   Review of Systems Review of Systems  Constitutional: Negative for diaphoresis.  Respiratory: Negative for hemoptysis and shortness of breath.   Cardiovascular: Positive for chest pain. Negative for palpitations and leg swelling.  Gastrointestinal: Negative for abdominal pain and vomiting.  All other systems reviewed and are negative.    Physical Exam Updated Vital Signs BP (!) 198/98   Pulse 65   Temp 98.4 F (36.9 C) (Oral)   Resp 18   Ht 6\' 3"  (1.905 m)   Wt 104.3 kg (230 lb)   SpO2 100%   BMI 28.75 kg/m   Physical Exam  Constitutional: Alexander [name] is oriented to person, place, and time. Alexander [name] appears well-developed and well-nourished. No distress.  HENT:  Head: Normocephalic and atraumatic.  Mouth/Throat: No oropharyngeal exudate.  Eyes: Pupils are equal, round, and reactive to light. Conjunctivae are normal.  Neck: Normal range of motion. Neck supple. No JVD present.  Cardiovascular: Normal rate, regular rhythm, normal heart sounds and intact distal pulses.   Pulmonary/Chest: Effort normal and breath sounds normal. No respiratory distress. Alexander [name] has no wheezes. Alexander [name] has no rales.  Abdominal: Soft. Bowel sounds are normal. Alexander [name] exhibits no mass. There is no tenderness. There is no rebound and no guarding.  Musculoskeletal: Normal range of motion.  Neurological: Alexander [name] is alert and oriented to person, place, and time.  Skin: Skin is warm and dry. Capillary refill takes less than 2 seconds.  Psychiatric: Alexander [name] has a normal mood and affect.     ED Treatments / Results   Vitals:   04/04/17 0326 04/04/17 0339  BP: (!) 213/100 (!) 198/98  Pulse: 65   Resp: 18   Temp: 98.4 F (36.9 C)   SpO2: 100%     Labs (all labs ordered are listed, but only abnormal results are displayed)  Results for orders placed or performed during the hospital  encounter of 04/04/17  CBC with Differential/Platelet  Result Value Ref Range   WBC 7.5 4.0 - 10.5 K/uL   RBC 4.02 (L) 4.22 - 5.81 MIL/uL   Hemoglobin 10.9 (L) 13.0 - 17.0 g/dL   HCT 33.3 (L) 39.0 - 52.0 %   MCV 82.8 78.0 - 100.0 fL   MCH 27.1 26.0 - 34.0 pg   MCHC 32.7 30.0 - 36.0 g/dL   RDW 13.2 11.5 - 15.5 %   Platelets 262 150 - 400 K/uL   Neutrophils Relative % 47 %   Neutro Abs 3.5 1.7 - 7.7 K/uL   Lymphocytes Relative 37 %   Lymphs Abs 2.8 0.7 - 4.0 K/uL   Monocytes Relative 8 %   Monocytes Absolute 0.6 0.1 - 1.0 K/uL   Eosinophils Relative 7 %  Eosinophils Absolute 0.5 0.0 - 0.7 K/uL   Basophils Relative 1 %   Basophils Absolute 0.1 0.0 - 0.1 K/uL  Basic metabolic panel  Result Value Ref Range   Sodium 137 135 - 145 mmol/L   Potassium 3.8 3.5 - 5.1 mmol/L   Chloride 105 101 - 111 mmol/L   CO2 23 22 - 32 mmol/L   Glucose, Bld 110 (H) 65 - 99 mg/dL   BUN 37 (H) 6 - 20 mg/dL   Creatinine, Ser 2.42 (H) 0.61 - 1.24 mg/dL   Calcium 9.0 8.9 - 10.3 mg/dL   GFR calc non Af Amer 31 (L) >60 mL/min   GFR calc Af Amer 36 (L) >60 mL/min   Anion gap 9 5 - 15  Troponin I  Result Value Ref Range   Troponin I <0.03 <0.03 ng/mL  Urinalysis, Routine w reflex microscopic  Result Value Ref Range   Color, Urine YELLOW YELLOW   APPearance CLEAR CLEAR   Specific Gravity, Urine 1.015 1.005 - 1.030   pH 5.5 5.0 - 8.0   Glucose, UA NEGATIVE NEGATIVE mg/dL   Hgb urine dipstick NEGATIVE NEGATIVE   Bilirubin Urine NEGATIVE NEGATIVE   Ketones, ur NEGATIVE NEGATIVE mg/dL   Protein, ur NEGATIVE NEGATIVE mg/dL   Nitrite NEGATIVE NEGATIVE   Leukocytes, UA NEGATIVE NEGATIVE   Dg Chest 2 View  Result Date: 04/04/2017 CLINICAL DATA:  46 y/o M; right-sided chest pain and tingling of the right arm. EXAM: CHEST  2 VIEW COMPARISON:  03/27/2015 chest radiograph. FINDINGS: Mild cardiomegaly. Clear lungs. No pleural effusion or pneumothorax. Bones are unremarkable. IMPRESSION: Mild cardiomegaly.   Clear lungs. Electronically Signed   By: Kristine Garbe M.D.   On: 04/04/2017 04:04    EKG  EKG Interpretation  Date/Time:  Friday April 04 2017 03:25:06 EDT Ventricular Rate:  70 PR Interval:    QRS Duration: 85 QT Interval:  402 QTC Calculation: 434 R Axis:   45 Text Interpretation:  Sinus rhythm Confirmed by Dory Horn) on 04/04/2017 3:52:41 AM       Radiology Dg Chest 2 View  Result Date: 04/04/2017 CLINICAL DATA:  46 y/o M; right-sided chest pain and tingling of the right arm. EXAM: CHEST  2 VIEW COMPARISON:  03/27/2015 chest radiograph. FINDINGS: Mild cardiomegaly. Clear lungs. No pleural effusion or pneumothorax. Bones are unremarkable. IMPRESSION: Mild cardiomegaly.  Clear lungs. Electronically Signed   By: Kristine Garbe M.D.   On: 04/04/2017 04:04    Procedures Procedures (including critical care time)  Medications Ordered in ED Medications  nitroGLYCERIN 50 mg in dextrose 5 % 250 mL (0.2 mg/mL) infusion (5 mcg/min Intravenous New Bag/Given 04/04/17 0404)       Final Clinical Impressions(s) / ED Diagnoses   Hypertensive crisis with cp will admit to medicine.  Accepting Dr. Briant Cedar, Webster Patrone, MD 04/04/17 [phone]

## 2017-04-04 NOTE — H&P (Signed)
History and Physical    Alexander [name] GGE:[id number] DOB: [birth year] DOA: 04/04/2017  PCP: Hoyt Koch, MD   Patient coming from: Home, by way of [facility]   Chief Complaint: Right-sided chest hypersensitivity   HPI: Alexander [name] is a 46 y.o. male with medical history significant for hypertension, hyperlipidemia, insulin-dependent diabetes mellitus, and chronic kidney disease stage III, now presenting to the emergency department for evaluation of chest discomfort. Patient reports that he had been in his usual state of health when he began to have some pain in the right anterior chest, beginning at approximately 5 PM on 04/03/2017. Pain was described as a burning sensation, "4/10" in intensity, constant, worse with light touch, such as by his shirt, and with no alleviating factors identified. Patient reports going about his usual activities that evening, but continued to have pain upon waking this morning, and presented to the ED for evaluation of this. He denies any associated shortness of breath, nausea, or diaphoresis. He has never had symptoms like this previously. No cough, no lower extremity swelling or tenderness, no fevers or chills, no headache prior to the nitroglycerin infusion, and no change in vision or hearing, and no focal numbness or weakness. He took one baby aspirin at home.  [facility] ED Course: Upon arrival to the [facility] ED, patient is found to be afebrile, saturating well on room air, hypertensive to 210/100, and with vitals otherwise stable. EKG features a normal sinus rhythm and chest x-ray is notable for mild cardiomegaly, but otherwise unremarkable. Chemistry panels notable for a BUN of 37 and serum creatinine of 2.42, consistent with his apparent baseline. CBC is notable for a stable normocytic anemia with hemoglobin of 10.9. Urinalysis is unremarkable. Troponin is undetectable 3 over a span of more than 14 hours. Patient was treated with acetaminophen  and started on nitroglycerin infusion in the ED. Blood pressure improved, but chest discomfort persisted unchanged. He was accepted in transfer to the stepdown unit at [facility] for observation of hypertensive urgency and atypical chest pain.  Review of Systems:  All other systems reviewed and apart from HPI, are negative.  Past Medical History:  Diagnosis Date  . CHEST PAIN 08/28/2009  . DIABETES MELLITUS, TYPE II 03/20/2007  . HYPERCHOLESTEROLEMIA 07/21/2008  . HYPERTENSION 03/20/2007  . Hypertensive urgency 03/27/2015  . Left ventricular dysfunction 03/28/2015   EF 40-45%, no WMA, grade 2 diastolic dysfunction  . Renal disorder     Past Surgical History:  Procedure Laterality Date  . KNEE ARTHROSCOPY Right      reports that he has never smoked. He has never used smokeless tobacco. He reports that he does not drink alcohol or use drugs.  No Known Allergies  Family History  Problem Relation Age of Onset  . Diabetes Mother   . Diabetes Father   . Hypertension Father   . Diabetes Maternal Grandfather   . Diabetes Paternal Grandfather   . Diabetes Brother   . Hypertension Brother      Prior to Admission medications   Medication Sig Start Date End Date Taking? Authorizing Provider  aspirin EC 81 MG EC tablet Take 1 tablet (81 mg total) by mouth daily. 03/30/15   Almyra Deforest, PA  atorvastatin (LIPITOR) 40 MG tablet TAKE 1 TABLET (40 MG TOTAL) BY MOUTH DAILY AT 6 PM. 07/26/16   [name], MD  BD INSULIN SYRINGE ULTRAFINE 31G X 15/64" 1 ML MISC USE WITH INSULIN DAILY 02/27/16   Loanne Drilling,  Hilliard Clark, MD  carvedilol (COREG) 12.5 MG tablet TAKE 1 TABLET (12.5 MG TOTAL) BY MOUTH 2 (TWO) TIMES DAILY WITH A MEAL. 09/02/16   [name], MD  FREESTYLE LITE test strip USE 2 TIMES DAILY 11/26/16   Renato Shin, MD  gabapentin (NEURONTIN) 300 MG capsule  07/25/16   [provider]  hydrALAZINE (APRESOLINE) 100 MG tablet TAKE 1 TABLET BY MOUTH 3 TIMES A DAY 04/03/17   Hilty,  Nadean Corwin, MD  Insulin Detemir (LEVEMIR FLEXTOUCH) 100 UNIT/ML Pen Inject 70 Units into the skin every morning. And pen needles 2/day 11/11/16   Renato Shin, MD  methocarbamol (ROBAXIN) 500 MG tablet Take 1 tablet (500 mg total) by mouth every 8 (eight) hours as needed. 08/13/16   Hudnall, Sharyn Lull, MD  nitroGLYCERIN (NITROSTAT) 0.4 MG SL tablet Place 1 tablet (0.4 mg total) under the tongue every 5 (five) minutes x 3 doses as needed for chest pain. Don't take within 24 hrs of Cialis 03/30/15   Almyra Deforest, PA  tadalafil (CIALIS) 20 MG tablet Take 1 tablet (20 mg total) by mouth daily as needed for erectile dysfunction. 07/15/14   Renato Shin, MD  Vitamin D, Ergocalciferol, (DRISDOL) 50000 units CAPS capsule  07/05/16   [provider]    Physical Exam: Vitals:   04/04/17 1745 04/04/17 1857 04/04/17 1953 04/04/17 1959  BP: (!) 165/98 (!) 164/81 (!) 201/97 (!) 197/95  Pulse:  72 62   Resp: 16 18 18    Temp:  98.8 F (37.1 C) 98.7 F (37.1 C)   TempSrc:  Oral Oral   SpO2:  100% 100%   Weight:  110.1 kg (242 lb 11.2 oz)    Height:  6\' 3"  (1.905 m)        Constitutional: NAD, calm, comfortable Eyes: PERTLA, lids and conjunctivae normal ENMT: Mucous membranes are moist. Posterior pharynx clear of any exudate or lesions.   Neck: normal, supple, no masses, no thyromegaly Respiratory: clear to auscultation bilaterally, no wheezing, no crackles. Normal respiratory effort.  Cardiovascular: S1 & S2 heard, regular rate and rhythm. No extremity edema. No significant JVD. Abdomen: No distension, no tenderness, no masses palpated. Bowel sounds normal.  Musculoskeletal: no clubbing / cyanosis. No joint deformity upper and lower extremities.   Skin: no significant rashes, lesions, ulcers. Pain elicited with light touch to skin overlying the right anterior chest; no appreciable rash there. Neurologic: CN 2-12 grossly intact. Sensation intact. Strength 5/5 in all 4 limbs.  Psychiatric: Alert and  oriented x 3. Pleasant and cooperative.     Labs on Admission: I have personally reviewed following labs and imaging studies  CBC:  Recent Labs Lab 04/04/17 0327  WBC 7.5  NEUTROABS 3.5  HGB 10.9*  HCT 33.3*  MCV 82.8  PLT 505   Basic Metabolic Panel:  Recent Labs Lab 04/04/17 0327  NA 137  K 3.8  CL 105  CO2 23  GLUCOSE 110*  BUN 37*  CREATININE 2.42*  CALCIUM 9.0   GFR: Estimated Creatinine Clearance: 51.6 mL/min (A) (by C-G formula based on SCr of 2.42 mg/dL (H)). Liver Function Tests: No results for input(s): AST, ALT, ALKPHOS, BILITOT, PROT, ALBUMIN in the last 168 hours. No results for input(s): LIPASE, AMYLASE in the last 168 hours. No results for input(s): AMMONIA in the last 168 hours. Coagulation Profile: No results for input(s): INR, PROTIME in the last 168 hours. Cardiac Enzymes:  Recent Labs Lab 04/04/17 0327 04/04/17 0740 04/04/17 1750  TROPONINI <0.03 <0.03 <  0.03   BNP (last 3 results) No results for input(s): PROBNP in the last 8760 hours. HbA1C: No results for input(s): HGBA1C in the last 72 hours. CBG:  Recent Labs Lab 04/04/17 1101  GLUCAP 135*   Lipid Profile: No results for input(s): CHOL, HDL, LDLCALC, TRIG, CHOLHDL, LDLDIRECT in the last 72 hours. Thyroid Function Tests: No results for input(s): TSH, T4TOTAL, FREET4, T3FREE, THYROIDAB in the last 72 hours. Anemia Panel: No results for input(s): VITAMINB12, FOLATE, FERRITIN, TIBC, IRON, RETICCTPCT in the last 72 hours. Urine analysis:    Component Value Date/Time   COLORURINE YELLOW 04/04/2017 0400   APPEARANCEUR CLEAR 04/04/2017 0400   LABSPEC 1.015 04/04/2017 0400   PHURINE 5.5 04/04/2017 0400   GLUCOSEU NEGATIVE 04/04/2017 0400   GLUCOSEU >=1000 01/08/2011 1012   HGBUR NEGATIVE 04/04/2017 0400   BILIRUBINUR NEGATIVE 04/04/2017 0400   KETONESUR NEGATIVE 04/04/2017 0400   PROTEINUR NEGATIVE 04/04/2017 0400   UROBILINOGEN 0.2 01/08/2011 1012   NITRITE NEGATIVE  04/04/2017 0400   LEUKOCYTESUR NEGATIVE 04/04/2017 0400   Sepsis Labs: @LABRCNTIP (procalcitonin:4,lacticidven:4) )No results found for this or any previous visit (from the past 240 hour(s)).   Radiological Exams on Admission: Dg Chest 2 View  Result Date: 04/04/2017 CLINICAL DATA:  46 y/o M; right-sided chest pain and tingling of the right arm. EXAM: CHEST  2 VIEW COMPARISON:  03/27/2015 chest radiograph. FINDINGS: Mild cardiomegaly. Clear lungs. No pleural effusion or pneumothorax. Bones are unremarkable. IMPRESSION: Mild cardiomegaly.  Clear lungs. Electronically Signed   By: Kristine Garbe M.D.   On: 04/04/2017 04:04    EKG: Independently reviewed. Normal sinus rhythm.   Assessment/Plan  1. Hypertension with hypertensive urgency  - Pt presented to [facility] with BP in 210/100 range and chest pain (very atypical for ACS)  - He is managed with Coreg and hydralazine at home, denies missed doses  - Was started on nitroglycerin infusion in the ED  - Plan to give home meds now and use hydralazine IVP's prn with goal SBP 170's overnight off of nitro infusion    2. Chest pain  - Presents with constant right anterior chest pain; worse with light touch, such as by his shirt - EKG without acute ischemic features, CXR with mild CM and o/w unremarkable, troponin undetectable x3 over >14 hrs  - Sxs are more consistent with neuropathic pain, such as early zoster, will monitor for rash, start lidocaine patch, consider gabapentin   3. CKD stage III  - SCr is 2.42 on admission and unchanged from the last chem panel in April '18  - Renally-dose medications, avoid nephrotoxins where possible  - Repeat chem panel in am  - Continue nephrology follow-up at discharge    4. Insulin-dependent DM  - A1c was 6.4 in January 2018   - Managed at home with Levemir 70 units daily  - Check CBG with meals and qHS, continue Levemir at 60 units daily with a sliding-scale correctional    5. Anemia  - Hgb  is 10.9 on admission with no bleeding evident  - This is stable from prior CBC's and likely secondary to CKD    DVT prophylaxis: sq heparin Code Status: Full  Family Communication: Wife updated at bedside Disposition Plan: Observe in SDU Consults called: None Admission status: Observation    Vianne Bulls, MD [facility] Pager [phone]  If 7PM-7AM, please contact night-coverage [email or web address] Password [facility]  04/04/2017, 8:19 PM

## 2017-04-05 DIAGNOSIS — I130 Hypertensive heart and chronic kidney disease with heart failure and stage 1 through stage 4 chronic kidney disease, or unspecified chronic kidney disease: Secondary | ICD-10-CM | POA: Diagnosis not present

## 2017-04-05 DIAGNOSIS — E0821 Diabetes mellitus due to underlying condition with diabetic nephropathy: Secondary | ICD-10-CM | POA: Diagnosis not present

## 2017-04-05 DIAGNOSIS — Z23 Encounter for immunization: Secondary | ICD-10-CM | POA: Diagnosis not present

## 2017-04-05 DIAGNOSIS — I160 Hypertensive urgency: Principal | ICD-10-CM

## 2017-04-05 DIAGNOSIS — R079 Chest pain, unspecified: Secondary | ICD-10-CM | POA: Diagnosis not present

## 2017-04-05 DIAGNOSIS — N183 Chronic kidney disease, stage 3 (moderate): Secondary | ICD-10-CM | POA: Diagnosis not present

## 2017-04-05 DIAGNOSIS — R0789 Other chest pain: Secondary | ICD-10-CM | POA: Diagnosis not present

## 2017-04-05 LAB — BASIC METABOLIC PANEL
ANION GAP: 9 (ref 5–15)
BUN: 30 mg/dL — AB (ref 6–20)
CHLORIDE: 104 mmol/L (ref 101–111)
CO2: 25 mmol/L (ref 22–32)
Calcium: 9.2 mg/dL (ref 8.9–10.3)
Creatinine, Ser: 2.28 mg/dL — ABNORMAL HIGH (ref 0.61–1.24)
GFR calc Af Amer: 38 mL/min — ABNORMAL LOW (ref >60)
GFR calc non Af Amer: 33 mL/min — ABNORMAL LOW (ref >60)
Glucose, Bld: 150 mg/dL — ABNORMAL HIGH (ref 65–99)
POTASSIUM: 4 mmol/L (ref 3.5–5.1)
SODIUM: 138 mmol/L (ref 135–145)

## 2017-04-05 LAB — CBC
HEMATOCRIT: 35.3 % — AB (ref 39.0–52.0)
HEMOGLOBIN: 11.6 g/dL — AB (ref 13.0–17.0)
MCH: 26.8 pg (ref 26.0–34.0)
MCHC: 32.9 g/dL (ref 30.0–36.0)
MCV: 81.5 fL (ref 78.0–100.0)
Platelets: 276 10*3/uL (ref 150–400)
RBC: 4.33 MIL/uL (ref 4.22–5.81)
RDW: 13.2 % (ref 11.5–15.5)
WBC: 8.4 10*3/uL (ref 4.0–10.5)

## 2017-04-05 LAB — GLUCOSE, CAPILLARY
GLUCOSE-CAPILLARY: 158 mg/dL — AB (ref 65–99)
Glucose-Capillary: 140 mg/dL — ABNORMAL HIGH (ref 65–99)

## 2017-04-05 LAB — HIV ANTIBODY (ROUTINE TESTING W REFLEX): HIV Screen 4th Generation wRfx: NONREACTIVE

## 2017-04-05 LAB — MRSA PCR SCREENING: MRSA by PCR: NEGATIVE

## 2017-04-05 LAB — TSH: TSH: 1.396 u[IU]/mL (ref 0.350–4.500)

## 2017-04-05 MED ORDER — ATORVASTATIN CALCIUM 40 MG PO TABS: 40.0000 mg | ORAL_TABLET | Freq: Every day | ORAL | Status: DC

## 2017-04-05 MED ORDER — AMLODIPINE BESYLATE 10 MG PO TABS: 10.0000 mg | ORAL_TABLET | Freq: Every day | ORAL | 0 refills | Status: DC

## 2017-04-05 MED ORDER — AMLODIPINE BESYLATE 10 MG PO TABS
10.0000 mg | ORAL_TABLET | Freq: Every day | ORAL | Status: DC
  Administered 2017-04-05: 10 mg via ORAL
  Filled 2017-04-05: qty 1

## 2017-04-05 MED ORDER — LIDOCAINE 5 % EX PTCH: 1.0000 | MEDICATED_PATCH | Freq: Every day | CUTANEOUS | 0 refills | Status: DC | PRN

## 2017-04-05 MED ORDER — INSULIN DETEMIR 100 UNIT/ML FLEXPEN: 70.0000 [IU] | PEN_INJECTOR | Freq: Every day | SUBCUTANEOUS | Status: DC

## 2017-04-05 NOTE — Discharge Summary (Signed)
Physician Discharge Summary  Alexander [name]:[id number] DOB: April 27, 1971 DOA: 04/04/2017  PCP: Hoyt Koch, MD  Admit date: 04/04/2017 Discharge date: 04/05/2017  Admitted From: home Disposition:  home  Recommendations for Outpatient Follow-up:  1. Follow up with PCP in 1week with repeat BMP 2. Follow-up with cardiology/Dr. Debara Pickett in 1-2 weeks 3. Follow-up with his primary nephrologist in 1-2 weeks 4. Follow-up in the ED symptoms worsen or new appear  Home Health: no  Equipment/Devices: none  Discharge Condition: stable  CODE STATUS: full  Diet recommendation: Heart Healthy / Carb Modified  Brief/Interim Summary: 46 year old male with history of hypertension, hyperlipidemia, insulin-dependent diabetes mellitus and chronic kidney disease stage III presented with right-sided chest discomfort. He was found to have extremely elevated blood pressure and was admitted on nitroglycerin drip with hypertensive urgency. His blood pressure is improving, amlodipine was added. His right-sided chest pain is improving. If his blood pressure remained stable throughout the day, she'll be discharged with outpatient follow-up with his cardiology and nephrology and primary care provider.  Discharge Diagnoses:  Principal Problem:   Chest pain Active Problems:   Essential hypertension   Hypertensive urgency   Type 2 diabetes mellitus with diabetic chronic kidney disease (HCC)   CKD (chronic kidney disease) stage 3, GFR 30-59 ml/min   Normocytic anemia  1. Hypertension with hypertensive urgency  - currently on nitroglycerin drip. Titrate it off. amlodipine started. Continue Coreg and hydralazine. If blood pressure remains stable, discharge patient home with outpatient follow-up with his cardiology and nephrology and primary care provider.  2. Chest pain  - Presents with constant right anterior chest pain; worse with light touch, such as by his shirt - improving. - EKG without acute  ischemic features, CXR with mild CM and o/w unremarkable, troponin undetectable x3 over >14 hrs  - Sxs are more consistent with neuropathic pain. Continue lidocaine patch and gabapentin - Outpatient follow-up with cardiology  3. CKD stage III  - creatinine stable. Outpatient follow-up with nephrology    4. Insulin-dependent DM  - A1c was 6.4 in January 2018   - continue home regimen. Outpatient follow-up  5. Anemia  - secondary to an anemia of chronic disease from chronic kidney disease - Hemoglobin stable.    Discharge Instructions  Discharge Instructions    Call MD for:  difficulty breathing, headache or visual disturbances    Complete by:  As directed    Call MD for:  extreme fatigue    Complete by:  As directed    Call MD for:  hives    Complete by:  As directed    Call MD for:  persistant dizziness or light-headedness    Complete by:  As directed    Call MD for:  persistant nausea and vomiting    Complete by:  As directed    Call MD for:  severe uncontrolled pain    Complete by:  As directed    Call MD for:  temperature >100.4    Complete by:  As directed    Diet - low sodium heart healthy    Complete by:  As directed    Diet Carb Modified    Complete by:  As directed    Increase activity slowly    Complete by:  As directed      Allergies as of 04/05/2017   No Known Allergies     Medication List    STOP taking these medications   ibuprofen 200 MG tablet Commonly known as:  ADVIL,MOTRIN     TAKE these medications   acetaminophen 500 MG tablet Commonly known as:  TYLENOL Take 500 mg by mouth every 6 (six) hours as needed for headache (pain).   amLODipine 10 MG tablet Commonly known as:  NORVASC Take 1 tablet (10 mg total) by mouth daily.   aspirin 81 MG EC tablet Take 1 tablet (81 mg total) by mouth daily.   atorvastatin 40 MG tablet Commonly known as:  LIPITOR Take 1 tablet (40 mg total) by mouth daily after supper.   BD INSULIN SYRINGE  ULTRAFINE 31G X 15/64" 1 ML Misc Generic drug:  Insulin Syringe-Needle U-100 USE WITH INSULIN DAILY   carvedilol 12.5 MG tablet Commonly known as:  COREG TAKE 1 TABLET (12.5 MG TOTAL) BY MOUTH 2 (TWO) TIMES DAILY WITH A MEAL.   FREESTYLE LITE test strip Generic drug:  glucose blood USE 2 TIMES DAILY   gabapentin 300 MG capsule Commonly known as:  NEURONTIN Take 300 mg by mouth 3 (three) times daily as needed (neuropathy).   hydrALAZINE 100 MG tablet Commonly known as:  APRESOLINE TAKE 1 TABLET BY MOUTH 3 TIMES A DAY What changed:  See the new instructions.   Insulin Detemir 100 UNIT/ML Pen Commonly known as:  LEVEMIR FLEXTOUCH Inject 70 Units into the skin daily before breakfast. And pen needles 2/day What changed:  when to take this   lidocaine 5 % Commonly known as:  LIDODERM Place 1 patch onto the skin daily as needed. Remove & Discard patch within 12 hours or as directed by MD   methocarbamol 500 MG tablet Commonly known as:  ROBAXIN Take 1 tablet (500 mg total) by mouth every 8 (eight) hours as needed. What changed:  reasons to take this   nitroGLYCERIN 0.4 MG SL tablet Commonly known as:  NITROSTAT Place 1 tablet (0.4 mg total) under the tongue every 5 (five) minutes x 3 doses as needed for chest pain. Don't take within 24 hrs of Cialis   tadalafil 20 MG tablet Commonly known as:  CIALIS Take 1 tablet (20 mg total) by mouth daily as needed for erectile dysfunction.   Vitamin D (Ergocalciferol) 50000 units Caps capsule Commonly known as:  DRISDOL Take 50,000 Units by mouth every Monday.            Discharge Care Instructions        Start     Ordered   04/06/17 0000  amLODipine (NORVASC) 10 MG tablet  Daily     04/05/17 1206   04/05/17 0000  atorvastatin (LIPITOR) 40 MG tablet  Daily after supper    Comments:  PATIENT IS REQUESTING REFILLS   04/05/17 1206   04/05/17 0000  Insulin Detemir (LEVEMIR FLEXTOUCH) 100 UNIT/ML Pen  Daily before breakfast      04/05/17 1206   04/05/17 0000  lidocaine (LIDODERM) 5 %  Daily PRN     04/05/17 1206   04/05/17 0000  Increase activity slowly     04/05/17 1206   04/05/17 0000  Diet - low sodium heart healthy     04/05/17 1206   04/05/17 0000  Diet Carb Modified     04/05/17 1206   04/05/17 0000  Call MD for:  temperature >100.4     04/05/17 1206   04/05/17 0000  Call MD for:  persistant nausea and vomiting     04/05/17 1206   04/05/17 0000  Call MD for:  severe uncontrolled pain     04/05/17 1206  04/05/17 0000  Call MD for:  difficulty breathing, headache or visual disturbances     04/05/17 1206   04/05/17 0000  Call MD for:  hives     04/05/17 1206   04/05/17 0000  Call MD for:  persistant dizziness or light-headedness     04/05/17 1206   04/05/17 0000  Call MD for:  extreme fatigue     04/05/17 1206     Follow-up Information    Hoyt Koch, MD Follow up in 1 week(s).   Specialty:  Internal Medicine Why:  with repeat BMP Contact information: [address] [place] [address] [phone]        [name], MD Follow up in 2 week(s).   Specialty:  Cardiology Contact information: [address] [place] [address] [phone]          No Known Allergies  Consultations:  none   Procedures/Studies: Dg Chest 2 View  Result Date: 04/04/2017 CLINICAL DATA:  46 y/o M; right-sided chest pain and tingling of the right arm. EXAM: CHEST  2 VIEW COMPARISON:  03/27/2015 chest radiograph. FINDINGS: Mild cardiomegaly. Clear lungs. No pleural effusion or pneumothorax. Bones are unremarkable. IMPRESSION: Mild cardiomegaly.  Clear lungs. Electronically Signed   By: Kristine Garbe M.D.   On: 04/04/2017 04:04      Subjective: Patient seen and examined at bedside. He feels better this morning. His right-sided chest pain is improving. He denies any shortness of breath,nausea or vomiting  Discharge Exam: Vitals:   04/05/17 0900  04/05/17 1004  BP: (!) 168/94 (!) 184/99  Pulse: 82   Resp:    Temp:    SpO2: 95%    Vitals:   04/05/17 0731 04/05/17 0759 04/05/17 0900 04/05/17 1004  BP: (!) 154/79 (!) 185/99 (!) 168/94 (!) 184/99  Pulse: 73 77 82   Resp: (!) 73     Temp: 99.5 F (37.5 C) (!) 100.4 F (38 C)    TempSrc: Oral Oral    SpO2: 99%  95%   Weight:      Height:        General: Pt is alert, awake, not in acute distress Cardiovascular: rate controlled, S1/S2 +; mild right sided chest wall tenderness Respiratory: bilateral decreased breath sounds at bases Abdominal: Soft, NT, ND, bowel sounds + Extremities: no edema, no cyanosis    The results of significant diagnostics from this hospitalization (including imaging, microbiology, ancillary and laboratory) are listed below for reference.     Microbiology: Recent Results (from the past 240 hour(s))  MRSA PCR Screening     Status: None   Collection Time: 04/04/17 10:40 PM  Result Value Ref Range Status   MRSA by PCR NEGATIVE NEGATIVE Final    Comment:        The GeneXpert MRSA Assay (FDA approved for NASAL specimens only), is one component of a comprehensive MRSA colonization surveillance program. It is not intended to diagnose MRSA infection nor to guide or monitor treatment for MRSA infections.      Labs: BNP (last 3 results) No results for input(s): BNP in the last 8760 hours. Basic Metabolic Panel:  Recent Labs Lab 04/04/17 0327 04/05/17 0537  NA 137 138  K 3.8 4.0  CL 105 104  CO2 23 25  GLUCOSE 110* 150*  BUN 37* 30*  CREATININE 2.42* 2.28*  CALCIUM 9.0 9.2   Liver Function Tests: No results for input(s): AST, ALT, ALKPHOS, BILITOT, PROT, ALBUMIN in the last  168 hours. No results for input(s): LIPASE, AMYLASE in the last 168 hours. No results for input(s): AMMONIA in the last 168 hours. CBC:  Recent Labs Lab 04/04/17 0327 04/05/17 0537  WBC 7.5 8.4  NEUTROABS 3.5  --   HGB 10.9* 11.6*  HCT 33.3* 35.3*  MCV  82.8 81.5  PLT 262 276   Cardiac Enzymes:  Recent Labs Lab 04/04/17 0327 04/04/17 0740 04/04/17 1750  TROPONINI <0.03 <0.03 <0.03   BNP: Invalid input(s): POCBNP CBG:  Recent Labs Lab 04/04/17 1101 04/04/17 2111 04/05/17 0826 04/05/17 1204  GLUCAP 135* 128* 140* 158*   D-Dimer No results for input(s): DDIMER in the last 72 hours. Hgb A1c No results for input(s): HGBA1C in the last 72 hours. Lipid Profile No results for input(s): CHOL, HDL, LDLCALC, TRIG, CHOLHDL, LDLDIRECT in the last 72 hours. Thyroid function studies  Recent Labs  04/05/17 0537  TSH 1.396   Anemia work up No results for input(s): VITAMINB12, FOLATE, FERRITIN, TIBC, IRON, RETICCTPCT in the last 72 hours. Urinalysis    Component Value Date/Time   COLORURINE YELLOW 04/04/2017 0400   APPEARANCEUR CLEAR 04/04/2017 0400   LABSPEC 1.015 04/04/2017 0400   PHURINE 5.5 04/04/2017 0400   GLUCOSEU NEGATIVE 04/04/2017 0400   GLUCOSEU >=1000 01/08/2011 1012   HGBUR NEGATIVE 04/04/2017 0400   BILIRUBINUR NEGATIVE 04/04/2017 0400   KETONESUR NEGATIVE 04/04/2017 0400   PROTEINUR NEGATIVE 04/04/2017 0400   UROBILINOGEN 0.2 01/08/2011 1012   NITRITE NEGATIVE 04/04/2017 0400   LEUKOCYTESUR NEGATIVE 04/04/2017 0400   Sepsis Labs Invalid input(s): PROCALCITONIN,  WBC,  LACTICIDVEN Microbiology Recent Results (from the past 240 hour(s))  MRSA PCR Screening     Status: None   Collection Time: 04/04/17 10:40 PM  Result Value Ref Range Status   MRSA by PCR NEGATIVE NEGATIVE Final    Comment:        The GeneXpert MRSA Assay (FDA approved for NASAL specimens only), is one component of a comprehensive MRSA colonization surveillance program. It is not intended to diagnose MRSA infection nor to guide or monitor treatment for MRSA infections.      Time coordinating discharge: 35 minutes  SIGNED:   Aline August, MD  [facility] 04/05/2017, 12:07 PM Pager: [phone]  If 7PM-7AM,  please contact night-coverage [email or web address] Password [email or web address]

## 2017-04-05 NOTE — Plan of Care (Signed)
Problem: Pain Managment: Goal: General experience of comfort will improve Outcome: Progressing Patient has had relief from chest pain and minimal headache while on Nitro drip. Patient advised to call for assistance if pain increases.   Problem: Physical Regulation: Goal: Ability to maintain clinical measurements within normal limits will improve Outcome: Progressing Nitro titrated throughout evening per MD's order to get patient off of Nitro and just use home medications/PRN Hydralazine. At the end of shift IVP Hydralazine given once, Nitro drip currently at 85mcg/min. Covering [facility] would like to keep the Nitro at 5 mcg/min until day team in to assess patient.

## 2017-04-05 NOTE — Progress Notes (Signed)
Nitro drip stopped per MD request. Discharge instructions reviewed with pt. Pt has no questions at this time. IV d/c.

## 2017-04-05 NOTE — Progress Notes (Signed)
Pt stated he would schedule is follow up appointment with his pcp.

## 2017-04-11 ENCOUNTER — Encounter: Payer: Self-pay | Admitting: Endocrinology

## 2017-04-11 ENCOUNTER — Encounter: Payer: Self-pay | Admitting: Nurse Practitioner

## 2017-04-11 ENCOUNTER — Ambulatory Visit (INDEPENDENT_AMBULATORY_CARE_PROVIDER_SITE_OTHER): Payer: BLUE CROSS/BLUE SHIELD | Admitting: Nurse Practitioner

## 2017-04-11 ENCOUNTER — Other Ambulatory Visit (INDEPENDENT_AMBULATORY_CARE_PROVIDER_SITE_OTHER): Payer: BLUE CROSS/BLUE SHIELD

## 2017-04-11 VITALS — BP 124/70 | HR 74 | Temp 97.8°F | Ht 75.0 in | Wt 242.0 lb

## 2017-04-11 DIAGNOSIS — N183 Chronic kidney disease, stage 3 unspecified: Secondary | ICD-10-CM

## 2017-04-11 DIAGNOSIS — R072 Precordial pain: Secondary | ICD-10-CM

## 2017-04-11 LAB — BASIC METABOLIC PANEL
BUN: 27 mg/dL — ABNORMAL HIGH (ref 6–23)
CO2: 27 mEq/L (ref 19–32)
Calcium: 9.1 mg/dL (ref 8.4–10.5)
Chloride: 101 mEq/L (ref 96–112)
Creatinine, Ser: 2.34 mg/dL — ABNORMAL HIGH (ref 0.40–1.50)
GFR: 38.9 mL/min — AB (ref >60.00)
GLUCOSE: 206 mg/dL — AB (ref 70–99)
POTASSIUM: 3.9 meq/L (ref 3.5–5.1)
SODIUM: 136 meq/L (ref 135–145)

## 2017-04-11 MED ORDER — NITROGLYCERIN 0.4 MG SL SUBL: 0.4000 mg | SUBLINGUAL_TABLET | SUBLINGUAL | 0 refills | Status: DC | PRN

## 2017-04-11 NOTE — Progress Notes (Signed)
Subjective:  Patient ID: Alexander [name], male    DOB: 01-Jul-1971  Age: 46 y.o. MRN: [id number]  CC: Hospitalization Follow-up (tenderness near chest area--dx shingle in the hospital?)  HPI Hospital follow up for right chest wall tenderness and hypertensive urgency: pain improved with gabapentin and lidocaine. Pain is worse with palpation and movement of right arm. BP has also improved Denies any new symptoms.  Needs nitroglycerine refill because current medication has expired. Uses medication prn.  Has not made appt with cardiology.  CKD: appt with nephrology 06/02/17.  DM managed by Dr. Loanne Drilling: Home glucose since hospital discharge <150. Denies any hypoglycemic episodes.  Reviewed labs results and radiology report.  Medication list reviewed and reconciled with patient. Outpatient Medications Prior to Visit  Medication Sig Dispense Refill  . amLODipine (NORVASC) 10 MG tablet Take 1 tablet (10 mg total) by mouth daily. 30 tablet 0  . aspirin EC 81 MG EC tablet Take 1 tablet (81 mg total) by mouth daily.    Marland Kitchen atorvastatin (LIPITOR) 40 MG tablet Take 1 tablet (40 mg total) by mouth daily after supper.    . carvedilol (COREG) 12.5 MG tablet TAKE 1 TABLET (12.5 MG TOTAL) BY MOUTH 2 (TWO) TIMES DAILY WITH A MEAL. 180 tablet 2  . FREESTYLE LITE test strip USE 2 TIMES DAILY 100 each 2  . gabapentin (NEURONTIN) 300 MG capsule Take 300 mg by mouth 3 (three) times daily as needed (neuropathy).     . hydrALAZINE (APRESOLINE) 100 MG tablet TAKE 1 TABLET BY MOUTH 3 TIMES A DAY (Patient taking differently: TAKE 1 TABLET (100 MG) BY MOUTH 3 TIMES A DAY) 90 tablet 3  . Insulin Detemir (LEVEMIR FLEXTOUCH) 100 UNIT/ML Pen Inject 70 Units into the skin daily before breakfast. And pen needles 2/day    . lidocaine (LIDODERM) 5 % Place 1 patch onto the skin daily as needed. Remove & Discard patch within 12 hours or as directed by MD 10 patch 0  . methocarbamol (ROBAXIN) 500 MG tablet Take 1 tablet  (500 mg total) by mouth every 8 (eight) hours as needed. (Patient taking differently: Take 500 mg by mouth every 8 (eight) hours as needed for muscle spasms. ) 60 tablet 1  . tadalafil (CIALIS) 20 MG tablet Take 1 tablet (20 mg total) by mouth daily as needed for erectile dysfunction. 10 tablet 11  . Vitamin D, Ergocalciferol, (DRISDOL) 50000 units CAPS capsule Take 50,000 Units by mouth every Monday.     . nitroGLYCERIN (NITROSTAT) 0.4 MG SL tablet Place 1 tablet (0.4 mg total) under the tongue every 5 (five) minutes x 3 doses as needed for chest pain. Don't take within 24 hrs of Cialis 25 tablet 3  . acetaminophen (TYLENOL) 500 MG tablet Take 500 mg by mouth every 6 (six) hours as needed for headache (pain).    . BD INSULIN SYRINGE ULTRAFINE 31G X 15/64" 1 ML MISC USE WITH INSULIN DAILY (Patient not taking: Reported on 04/11/2017) 100 each 3   No facility-administered medications prior to visit.     ROS See HPI  Objective:  BP 124/70   Pulse 74   Temp 97.8 F (36.6 C)   Ht 6\' 3"  (1.905 m)   Wt 242 lb (109.8 kg)   SpO2 99%   BMI 30.25 kg/m   BP Readings from Last 3 Encounters:  04/11/17 124/70  04/05/17 (!) 147/84  01/09/17 135/78    Wt Readings from Last 3 Encounters:  04/11/17 242 lb (109.8  kg)  04/04/17 242 lb 11.2 oz (110.1 kg)  01/09/17 243 lb (110.2 kg)    Physical Exam  Constitutional: He is oriented to person, place, and time. No distress.  Cardiovascular: Normal rate.   Pulmonary/Chest: Effort normal and breath sounds normal. No respiratory distress. He has no wheezes. He has no rales. He exhibits tenderness.  Right upper chest wall tenderness.  Neurological: He is alert and oriented to person, place, and time.  Skin: Skin is warm and dry. No rash noted. No erythema.  Psychiatric: He has a normal mood and affect. His behavior is normal.  Vitals reviewed.   Lab Results  Component Value Date   WBC 8.4 04/05/2017   HGB 11.6 (L) 04/05/2017   HCT 35.3 (L)  04/05/2017   PLT 276 04/05/2017   GLUCOSE 206 (H) 04/11/2017   CHOL 138 10/11/2016   TRIG 83.0 10/11/2016   HDL 40.40 10/11/2016   LDLDIRECT 164.0 07/15/2014   LDLCALC 81 10/11/2016   ALT 22 10/11/2016   AST 21 10/11/2016   NA 136 04/11/2017   K 3.9 04/11/2017   CL 101 04/11/2017   CREATININE 2.34 (H) 04/11/2017   BUN 27 (H) 04/11/2017   CO2 27 04/11/2017   TSH 1.396 04/05/2017   PSA 0.68 01/20/2015   HGBA1C 6.4 11/11/2016   MICROALBUR 12.3 (H) 10/28/2014    Dg Chest 2 View  Result Date: 04/04/2017 CLINICAL DATA:  46 y/o M; right-sided chest pain and tingling of the right arm. EXAM: CHEST  2 VIEW COMPARISON:  03/27/2015 chest radiograph. FINDINGS: Mild cardiomegaly. Clear lungs. No pleural effusion or pneumothorax. Bones are unremarkable. IMPRESSION: Mild cardiomegaly.  Clear lungs. Electronically Signed   By: Kristine Garbe M.D.   On: 04/04/2017 04:04    Assessment & Plan:   Eulalio was seen today for hospitalization follow-up.  Diagnoses and all orders for this visit:  CKD (chronic kidney disease) stage 3, GFR 30-59 ml/min (HCC) -     Basic metabolic panel; Future  Precordial pain -     nitroGLYCERIN (NITROSTAT) 0.4 MG SL tablet; Place 1 tablet (0.4 mg total) under the tongue every 5 (five) minutes x 3 doses as needed for chest pain. Don't take within 24 hrs of Cialis   I am having Mr. Boody maintain his tadalafil, aspirin, BD INSULIN SYRINGE ULTRAFINE, Vitamin D (Ergocalciferol), methocarbamol, gabapentin, carvedilol, FREESTYLE LITE, hydrALAZINE, acetaminophen, amLODipine, atorvastatin, Insulin Detemir, lidocaine, and nitroGLYCERIN.  Meds ordered this encounter  Medications  . nitroGLYCERIN (NITROSTAT) 0.4 MG SL tablet    Sig: Place 1 tablet (0.4 mg total) under the tongue every 5 (five) minutes x 3 doses as needed for chest pain. Don't take within 24 hrs of Cialis    Dispense:  25 tablet    Refill:  0    Order Specific Question:   Supervising Provider      Answer:   Cassandria Anger [[id number]]    Follow-up: Return if symptoms worsen or fail to improve.  Wilfred Lacy, NP

## 2017-04-11 NOTE — Patient Instructions (Addendum)
BMP: stable renal function.  Use gabapentin once a day at bedtime x 1week then use as needed.  Continue use of lidocaine patch as needed.  Go to basement for blood draw.  Make follow up appt with cardiology and nephrology as instructed.  Costochondritis vs herpetic neuralgia.

## 2017-05-30 DIAGNOSIS — I129 Hypertensive chronic kidney disease with stage 1 through stage 4 chronic kidney disease, or unspecified chronic kidney disease: Secondary | ICD-10-CM | POA: Diagnosis not present

## 2017-05-30 DIAGNOSIS — M9080 Osteopathy in diseases classified elsewhere, unspecified site: Secondary | ICD-10-CM | POA: Diagnosis not present

## 2017-05-30 DIAGNOSIS — N183 Chronic kidney disease, stage 3 (moderate): Secondary | ICD-10-CM | POA: Diagnosis not present

## 2017-05-30 DIAGNOSIS — E889 Metabolic disorder, unspecified: Secondary | ICD-10-CM | POA: Diagnosis not present

## 2017-06-02 DIAGNOSIS — I129 Hypertensive chronic kidney disease with stage 1 through stage 4 chronic kidney disease, or unspecified chronic kidney disease: Secondary | ICD-10-CM | POA: Diagnosis not present

## 2017-06-02 DIAGNOSIS — N183 Chronic kidney disease, stage 3 (moderate): Secondary | ICD-10-CM | POA: Diagnosis not present

## 2017-06-02 DIAGNOSIS — E1122 Type 2 diabetes mellitus with diabetic chronic kidney disease: Secondary | ICD-10-CM | POA: Diagnosis not present

## 2017-06-02 DIAGNOSIS — E889 Metabolic disorder, unspecified: Secondary | ICD-10-CM | POA: Diagnosis not present

## 2017-06-03 ENCOUNTER — Other Ambulatory Visit: Payer: Self-pay | Admitting: Internal Medicine

## 2017-06-03 NOTE — Telephone Encounter (Signed)
REFILL 

## 2017-07-11 ENCOUNTER — Other Ambulatory Visit: Payer: Self-pay | Admitting: Endocrinology

## 2017-07-12 NOTE — Telephone Encounter (Signed)
Please refill x 1 Ov is due  

## 2017-07-14 ENCOUNTER — Other Ambulatory Visit: Payer: Self-pay

## 2017-08-05 ENCOUNTER — Other Ambulatory Visit: Payer: Self-pay

## 2017-08-05 MED ORDER — INSULIN PEN NEEDLE 32G X 4 MM MISC: 3 refills | Status: DC

## 2017-09-06 DIAGNOSIS — H52223 Regular astigmatism, bilateral: Secondary | ICD-10-CM | POA: Diagnosis not present

## 2017-09-06 LAB — HM DIABETES EYE EXAM

## 2017-09-08 DIAGNOSIS — E559 Vitamin D deficiency, unspecified: Secondary | ICD-10-CM | POA: Diagnosis not present

## 2017-09-08 DIAGNOSIS — I129 Hypertensive chronic kidney disease with stage 1 through stage 4 chronic kidney disease, or unspecified chronic kidney disease: Secondary | ICD-10-CM | POA: Diagnosis not present

## 2017-09-08 DIAGNOSIS — N183 Chronic kidney disease, stage 3 (moderate): Secondary | ICD-10-CM | POA: Diagnosis not present

## 2017-09-08 DIAGNOSIS — E889 Metabolic disorder, unspecified: Secondary | ICD-10-CM | POA: Diagnosis not present

## 2017-09-08 DIAGNOSIS — M9080 Osteopathy in diseases classified elsewhere, unspecified site: Secondary | ICD-10-CM | POA: Diagnosis not present

## 2017-09-09 DIAGNOSIS — N183 Chronic kidney disease, stage 3 (moderate): Secondary | ICD-10-CM | POA: Diagnosis not present

## 2017-09-09 DIAGNOSIS — E1122 Type 2 diabetes mellitus with diabetic chronic kidney disease: Secondary | ICD-10-CM | POA: Diagnosis not present

## 2017-09-09 DIAGNOSIS — I129 Hypertensive chronic kidney disease with stage 1 through stage 4 chronic kidney disease, or unspecified chronic kidney disease: Secondary | ICD-10-CM | POA: Diagnosis not present

## 2017-09-09 DIAGNOSIS — E889 Metabolic disorder, unspecified: Secondary | ICD-10-CM | POA: Diagnosis not present

## 2017-10-03 ENCOUNTER — Other Ambulatory Visit: Payer: Self-pay | Admitting: Endocrinology

## 2017-11-29 ENCOUNTER — Other Ambulatory Visit: Payer: Self-pay | Admitting: Internal Medicine

## 2017-12-02 ENCOUNTER — Other Ambulatory Visit: Payer: Self-pay | Admitting: Endocrinology

## 2017-12-02 ENCOUNTER — Other Ambulatory Visit: Payer: Self-pay | Admitting: Internal Medicine

## 2017-12-02 NOTE — Telephone Encounter (Signed)
Please refill x 3 mos Ov is due 

## 2017-12-15 DIAGNOSIS — N183 Chronic kidney disease, stage 3 (moderate): Secondary | ICD-10-CM | POA: Diagnosis not present

## 2017-12-15 DIAGNOSIS — M9080 Osteopathy in diseases classified elsewhere, unspecified site: Secondary | ICD-10-CM | POA: Diagnosis not present

## 2017-12-15 DIAGNOSIS — D631 Anemia in chronic kidney disease: Secondary | ICD-10-CM | POA: Diagnosis not present

## 2017-12-15 DIAGNOSIS — E889 Metabolic disorder, unspecified: Secondary | ICD-10-CM | POA: Diagnosis not present

## 2017-12-15 DIAGNOSIS — I129 Hypertensive chronic kidney disease with stage 1 through stage 4 chronic kidney disease, or unspecified chronic kidney disease: Secondary | ICD-10-CM | POA: Diagnosis not present

## 2017-12-15 DIAGNOSIS — E559 Vitamin D deficiency, unspecified: Secondary | ICD-10-CM | POA: Diagnosis not present

## 2017-12-16 DIAGNOSIS — I129 Hypertensive chronic kidney disease with stage 1 through stage 4 chronic kidney disease, or unspecified chronic kidney disease: Secondary | ICD-10-CM | POA: Diagnosis not present

## 2017-12-16 DIAGNOSIS — N183 Chronic kidney disease, stage 3 (moderate): Secondary | ICD-10-CM | POA: Diagnosis not present

## 2017-12-16 DIAGNOSIS — E1122 Type 2 diabetes mellitus with diabetic chronic kidney disease: Secondary | ICD-10-CM | POA: Diagnosis not present

## 2017-12-16 DIAGNOSIS — I951 Orthostatic hypotension: Secondary | ICD-10-CM | POA: Insufficient documentation

## 2017-12-16 DIAGNOSIS — E889 Metabolic disorder, unspecified: Secondary | ICD-10-CM | POA: Diagnosis not present

## 2018-04-06 DIAGNOSIS — E1122 Type 2 diabetes mellitus with diabetic chronic kidney disease: Secondary | ICD-10-CM | POA: Diagnosis not present

## 2018-04-06 DIAGNOSIS — E559 Vitamin D deficiency, unspecified: Secondary | ICD-10-CM | POA: Diagnosis not present

## 2018-04-06 DIAGNOSIS — M9080 Osteopathy in diseases classified elsewhere, unspecified site: Secondary | ICD-10-CM | POA: Diagnosis not present

## 2018-04-06 DIAGNOSIS — E889 Metabolic disorder, unspecified: Secondary | ICD-10-CM | POA: Diagnosis not present

## 2018-04-06 DIAGNOSIS — N183 Chronic kidney disease, stage 3 (moderate): Secondary | ICD-10-CM | POA: Diagnosis not present

## 2018-04-09 DIAGNOSIS — E889 Metabolic disorder, unspecified: Secondary | ICD-10-CM | POA: Diagnosis not present

## 2018-04-09 DIAGNOSIS — I129 Hypertensive chronic kidney disease with stage 1 through stage 4 chronic kidney disease, or unspecified chronic kidney disease: Secondary | ICD-10-CM | POA: Diagnosis not present

## 2018-04-09 DIAGNOSIS — N183 Chronic kidney disease, stage 3 (moderate): Secondary | ICD-10-CM | POA: Diagnosis not present

## 2018-04-09 DIAGNOSIS — E1122 Type 2 diabetes mellitus with diabetic chronic kidney disease: Secondary | ICD-10-CM | POA: Diagnosis not present

## 2018-04-16 ENCOUNTER — Encounter: Payer: Self-pay | Admitting: Internal Medicine

## 2018-04-16 ENCOUNTER — Ambulatory Visit: Payer: BLUE CROSS/BLUE SHIELD | Admitting: Internal Medicine

## 2018-04-16 VITALS — BP 134/80 | HR 70 | Ht 75.0 in | Wt 256.2 lb

## 2018-04-16 DIAGNOSIS — I10 Essential (primary) hypertension: Secondary | ICD-10-CM | POA: Diagnosis not present

## 2018-04-16 DIAGNOSIS — E785 Hyperlipidemia, unspecified: Secondary | ICD-10-CM

## 2018-04-16 NOTE — Progress Notes (Signed)
Cardiology Office Note   Date:  04/16/2018   ID:  ZYDEN SUMAN, DOB 07-01-1971, MRN [id number]  PCP:  Hoyt Koch, MD  Cardiologist:  Dr Charma Igo, MD   No chief complaint on file. No complaints  History of Present Illness: Alexander [name] is a 47 y.o. male with a history of DM, HL, HTN, d/c 09/22 after hospitalization for atyp CP, hypertensive urgency, EF 40-45% by echo w/ gr 2 DD, MV w/ small defect EF 33%. ARF on CKD. He was recently seen by [name] and medications were adjusted. It was noted that his creatinine had risen and he was taken off of ACE inhibitor and a increase of his carvedilol was made. His blood pressure today is very well controlled in the office at 122/76, however I reviewed a blood pressure log from home which indicated varying blood pressures between 782U and 235T systolic as well as diastolics between 90 and 614 at times. This suggests that it may not be as well controlled as we suspect. Is not clear what would be causing his labile blood pressures. He is on 3 times a day hydralazine which he takes without fail. There could be a waning effect of the medication. He does have some chronic kidney disease and another possibility could be some degree of fibromuscular dysplasia or renal artery stenosis. I do not believe this is been evaluated. Of note he had a repeat echocardiogram pleased to say that his EF is now come back to 55-60%. He did have a low risk Myoview in the hospital, but his ejection fraction was reduced. Currently he is asymptomatic and denies chest pain or worsening shortness of breath.  07/12/2016  Alexander [name] returns today for follow-up. He reports feeling well and generally feels like his blood pressure is somewhat elevated although recently saw a nephrologist in [facility] and he felt like his blood pressure was well controlled. Today's blood pressure is 157/82, however I rechecked the blood pressure and it came back at  120/76. We then checked his blood pressure with his home cuff which was 135/76. There may be a slight discrepancy with a favor to higher blood pressure of his home cuff. In general I feel that his blood pressure control is very good and may need to be tweaked somewhat.  01/09/2017  Alexander [name] was seen today in follow-up. Overall he feels well. He denies a chest pain or worsening shortness of breath. He has had interim improvement in LVEF back to normal. His blood pressure has been better controlled. Today's 135/78. He's working with his nephrologist. Creatinine is recently 2.2. He has been complaining of some heaviness in his leg when he walks. Is not necessarily crampy pain or burning. It comes and goes with exertion without. He does have a history of some peripheral neuropathy and was supposed to be on gabapentin but has not taken it due to some fear of the medication. He is on a atorvastatin. His most recent lipid profile shows an LDL-C of 81, but previously he was well controlled at 61.  04/16/2018  Alexander [name] returns today for follow-up.  He is doing well.  He denies chest pain or any shortness of breath.  EKG shows sinus rhythm with nonspecific T wave changes.  Blood pressure is 134/80.  Past Medical History:  Diagnosis Date  . CHEST PAIN 08/28/2009  . DIABETES MELLITUS, TYPE II 03/20/2007  . HYPERCHOLESTEROLEMIA 07/21/2008  . HYPERTENSION 03/20/2007  . Hypertensive  urgency 03/27/2015  . Left ventricular dysfunction 03/28/2015   EF 40-45%, no WMA, grade 2 diastolic dysfunction  . Renal disorder     Past Surgical History:  Procedure Laterality Date  . KNEE ARTHROSCOPY Right     Current Outpatient Prescriptions  Medication Sig Dispense Refill  . aspirin EC 81 MG EC tablet Take 1 tablet (81 mg total) by mouth daily.    Marland Kitchen atorvastatin (LIPITOR) 40 MG tablet Take 1 tablet (40 mg total) by mouth daily at 6 PM. 90 tablet 3  . carvedilol (COREG) 6.25 MG tablet Take 1 tablet (6.25 mg total)  by mouth 2 (two) times daily with a meal. 60 tablet 5  . hydrALAZINE (APRESOLINE) 100 MG tablet Take 1 tablet (100 mg total) by mouth every 8 (eight) hours. 90 tablet 5  . insulin detemir (LEVEMIR) 100 UNIT/ML injection Inject 2 mLs (200 Units total) into the skin every morning. (Patient taking differently: Inject 200 Units into the skin every morning. Patient states he is taking 200 units daily per patient) 70 mL 11  . lisinopril (PRINIVIL,ZESTRIL) 20 MG tablet Take 1 tablet (20 mg total) by mouth daily. 90 tablet 3  . nitroGLYCERIN (NITROSTAT) 0.4 MG SL tablet Place 1 tablet (0.4 mg total) under the tongue every 5 (five) minutes x 3 doses as needed for chest pain. Don't take within 24 hrs of Cialis 25 tablet 3  . tadalafil (CIALIS) 20 MG tablet Take 1 tablet (20 mg total) by mouth daily as needed for erectile dysfunction. 10 tablet 11   No current facility-administered medications for this visit.    Allergies:   Patient has no known allergies.    Social History:  The patient  reports that he has never smoked. He has never used smokeless tobacco. He reports that he does not drink alcohol or use drugs.   Family History:  The patient's family history includes Diabetes in his brother, father, maternal grandfather, mother, and paternal grandfather; Hypertension in his brother and father.    ROS:  Please see the history of present illness. All other systems are reviewed and negative.    PHYSICAL EXAM: VS:  BP 134/80   Pulse 70   Ht 6\' 3"  (1.905 m)   Wt 256 lb 3.2 oz (116.2 kg)   BMI 32.02 kg/m  , BMI Body mass index is 32.02 kg/m. General appearance: alert and no distress Neck: no carotid bruit, no JVD and thyroid not enlarged, symmetric, no tenderness/mass/nodules Lungs: clear to auscultation bilaterally Heart: regular rate and rhythm, S1, S2 normal, no murmur, click, rub or gallop Abdomen: soft, non-tender; bowel sounds normal; no masses,  no organomegaly Extremities: extremities  normal, atraumatic, no cyanosis or edema Pulses: 2+ and symmetric Skin: Skin color, texture, turgor normal. No rashes or lesions Neurologic: Grossly normal Psych: Pleasant  EKG:   Sinus rhythm at 70, nonspecific T wave changes  Recent Labs: No results found for requested labs within last 8760 hours.    Lipid Panel    Component Value Date/Time   CHOL 138 10/11/2016 1545   TRIG 83.0 10/11/2016 1545   HDL 40.40 10/11/2016 1545   CHOLHDL 3 10/11/2016 1545   VLDL 16.6 10/11/2016 1545   LDLCALC 81 10/11/2016 1545   LDLDIRECT 164.0 07/15/2014 1703     Wt Readings from Last 3 Encounters:  04/16/18 256 lb 3.2 oz (116.2 kg)  04/11/17 242 lb (109.8 kg)  04/04/17 242 lb 11.2 oz (110.1 kg)     Other studies Reviewed: Additional  studies/ records that were reviewed today include: Hospital records, old ECGs.  ASSESSMENT:  1. Type 2 diabetes on insulin 2. Labile hypertension 3. Recent systolic congestive heart failure with improvement in LVEF to 55-60% 4. Hypertensive heart disease with heart failure 5. Hypertensive nephropathy - CKD3  PLAN: 1. Mr. Vanaken doing well without recurrent chest pain or worsening shortness of breath.  EF had normalized to 55 to 60%.  He is due for repeat lipid profile however he recently had been off of his medication.  He says is going to restart it and has been noncompliant with diet, including weight gain from 242 up to 256 over the past year.  He is going to continue to work on that.  We will plan a repeat lipid profile in 3 months.  Goal LDL remains above 70, then he will need additional therapy for his lipids  [name], MD, [facility], [place] Director of the Advanced Lipid Disorders &  Cardiovascular Risk Reduction Clinic Diplomate of the American Board of Clinical Lipidology Attending Cardiologist  Direct Dial: [phone]  Fax: [phone]  Website:  [email or web address]   [name], MD    04/16/2018 8:43 AM

## 2018-04-16 NOTE — Patient Instructions (Signed)
Medication Instructions:  Continue current medications If you need a refill on your cardiac medications before your next appointment, please call your pharmacy.   Lab work: FASTING lab work in 3 months to check cholesterol If you have labs (blood work) drawn today and your tests are completely normal, you will receive your results only by: Marland Kitchen MyChart Message (if you have MyChart) OR . A paper copy in the mail If you have any lab test that is abnormal or we need to change your treatment, we will call you to review the results.  Testing/Procedures: NONE  Follow-Up: At [facility], you and your health needs are our priority.  As part of our continuing mission to provide you with exceptional heart care, we have created designated Provider Care Teams.  These Care Teams include your primary Cardiologist (physician) and Advanced Practice Providers (APPs -  Physician Assistants and Nurse Practitioners) who all work together to provide you with the care you need, when you need it. You will need a follow up appointment in 12 months.  Please call our office 2 months in advance to schedule this appointment.  You may see [name], MD or one of the following Advanced Practice Providers on your designated Care Team: [place], [place] . Fabian Sharp, PA-C  Any Other Special Instructions Will Be Listed Below (If Applicable).

## 2018-04-19 ENCOUNTER — Encounter: Payer: Self-pay | Admitting: Internal Medicine

## 2018-07-09 ENCOUNTER — Other Ambulatory Visit: Payer: Self-pay

## 2018-07-09 DIAGNOSIS — E1122 Type 2 diabetes mellitus with diabetic chronic kidney disease: Secondary | ICD-10-CM

## 2018-07-09 DIAGNOSIS — N183 Chronic kidney disease, stage 3 (moderate): Principal | ICD-10-CM

## 2018-07-09 DIAGNOSIS — Z794 Long term (current) use of insulin: Principal | ICD-10-CM

## 2018-07-09 MED ORDER — FREESTYLE LANCETS MISC: 12 refills | Status: AC

## 2018-07-12 ENCOUNTER — Other Ambulatory Visit: Payer: Self-pay | Admitting: Internal Medicine

## 2018-07-15 DIAGNOSIS — H52223 Regular astigmatism, bilateral: Secondary | ICD-10-CM | POA: Diagnosis not present

## 2018-07-16 ENCOUNTER — Other Ambulatory Visit: Payer: Self-pay | Admitting: Endocrinology

## 2018-07-16 ENCOUNTER — Telehealth: Payer: Self-pay

## 2018-07-16 NOTE — Telephone Encounter (Signed)
Pt requested refill of FreeStyle test strips. However, received notification from pt insurance that FreeStyle test strips was denied by insurance. Unable to perform PA d/t pt in need of office appt. Last seen 11/11/16. Attempted to call pt to schedule appt. However, unable to LVM d/t VM being full.

## 2018-07-17 NOTE — Telephone Encounter (Signed)
Called pt to inform about issues with Freestyle Libre strips refill. OV scheduled 07/20/18

## 2018-07-20 ENCOUNTER — Encounter: Payer: Self-pay | Admitting: Endocrinology

## 2018-07-20 ENCOUNTER — Ambulatory Visit: Payer: BLUE CROSS/BLUE SHIELD | Admitting: Endocrinology

## 2018-07-20 VITALS — BP 162/102 | HR 70 | Ht 75.0 in | Wt 261.8 lb

## 2018-07-20 DIAGNOSIS — E1122 Type 2 diabetes mellitus with diabetic chronic kidney disease: Secondary | ICD-10-CM

## 2018-07-20 DIAGNOSIS — Z794 Long term (current) use of insulin: Secondary | ICD-10-CM | POA: Diagnosis not present

## 2018-07-20 DIAGNOSIS — N183 Chronic kidney disease, stage 3 unspecified: Secondary | ICD-10-CM

## 2018-07-20 LAB — POCT GLYCOSYLATED HEMOGLOBIN (HGB A1C): Hemoglobin A1C: 7.4 % — AB (ref 4.0–5.6)

## 2018-07-20 MED ORDER — GLUCOSE BLOOD VI STRP: 1.0000 | ORAL_STRIP | Freq: Two times a day (BID) | 3 refills | Status: AC

## 2018-07-20 MED ORDER — INSULIN DETEMIR 100 UNIT/ML FLEXPEN: 70.0000 [IU] | PEN_INJECTOR | SUBCUTANEOUS | 11 refills | Status: DC

## 2018-07-20 NOTE — Progress Notes (Signed)
Subjective:    Patient ID: Alexander [name], male    DOB: [birth year], 48 y.o.   MRN: [id number]  HPI Pt returns for f/u of diabetes mellitus:  DM type: insulin-requiring type 2 Dx'ed: 0867 Complications: polyneuropathy, renal insufficiency, and NPDR.   Therapy: insulin since 2013.   DKA: never.  Severe hypoglycemia: never.   Pancreatitis: never.  Other: he declines multiple daily injections.  He declines weight loss surgery.   Interval history: no cbg record, but states cbg's vary from 130-160.  It is in general higher as the day goes on.  pt states he feels well in general.   Past Medical History:  Diagnosis Date  . CHEST PAIN 08/28/2009  . DIABETES MELLITUS, TYPE II 03/20/2007  . HYPERCHOLESTEROLEMIA 07/21/2008  . HYPERTENSION 03/20/2007  . Hypertensive urgency 03/27/2015  . Left ventricular dysfunction 03/28/2015   EF 40-45%, no WMA, grade 2 diastolic dysfunction  . Renal disorder     Past Surgical History:  Procedure Laterality Date  . KNEE ARTHROSCOPY Right     Social History   Socioeconomic History  . Marital status: Married    Spouse name: Not on file  . Number of children: Not on file  . Years of education: Not on file  . Highest education level: Not on file  Occupational History  . Occupation: Training and development officer: [employer]  Social Needs  . Financial resource strain: Not on file  . Food insecurity:    Worry: Not on file    Inability: Not on file  . Transportation needs:    Medical: Not on file    Non-medical: Not on file  Tobacco Use  . Smoking status: Never Smoker  . Smokeless tobacco: Never Used  Substance and Sexual Activity  . Alcohol use: No  . Drug use: No  . Sexual activity: Not on file  Lifestyle  . Physical activity:    Days per week: Not on file    Minutes per session: Not on file  . Stress: Not on file  Relationships  . Social connections:    Talks on phone: Not on file    Gets together: Not on file    Attends religious service: Not  on file    Active member of club or organization: Not on file    Attends meetings of clubs or organizations: Not on file    Relationship status: Not on file  . Intimate partner violence:    Fear of current or ex partner: Not on file    Emotionally abused: Not on file    Physically abused: Not on file    Forced sexual activity: Not on file  Other Topics Concern  . Not on file  Social History Narrative  . Not on file    Current Outpatient Medications on File Prior to Visit  Medication Sig Dispense Refill  . acetaminophen (TYLENOL) 500 MG tablet Take 500 mg by mouth every 6 (six) hours as needed for headache (pain).    Marland Kitchen amLODipine (NORVASC) 10 MG tablet Take 1 tablet (10 mg total) by mouth daily. 30 tablet 0  . aspirin EC 81 MG EC tablet Take 1 tablet (81 mg total) by mouth daily.    Marland Kitchen atorvastatin (LIPITOR) 40 MG tablet TAKE 1 TABLET (40 MG TOTAL) BY MOUTH DAILY AT 6 PM. 90 tablet 1  . carvedilol (COREG) 12.5 MG tablet TAKE 1 TABLET (12.5 MG TOTAL) BY MOUTH 2 (TWO) TIMES DAILY WITH A MEAL. 180 tablet 3  .  gabapentin (NEURONTIN) 300 MG capsule Take 300 mg by mouth 3 (three) times daily as needed (neuropathy).     . hydrALAZINE (APRESOLINE) 100 MG tablet TAKE 1 TABLET BY MOUTH 3 TIMES A DAY 90 tablet 0  . Lancets (FREESTYLE) lancets Use to monitor glucose levels BID; E11.22 100 each 12  . lidocaine (LIDODERM) 5 % Place 1 patch onto the skin daily as needed. Remove & Discard patch within 12 hours or as directed by MD 10 patch 0  . methocarbamol (ROBAXIN) 500 MG tablet Take 1 tablet (500 mg total) by mouth every 8 (eight) hours as needed. (Patient taking differently: Take 500 mg by mouth every 8 (eight) hours as needed for muscle spasms. ) 60 tablet 1  . nitroGLYCERIN (NITROSTAT) 0.4 MG SL tablet Place 1 tablet (0.4 mg total) under the tongue every 5 (five) minutes x 3 doses as needed for chest pain. Don't take within 24 hrs of Cialis 25 tablet 0  . tadalafil (CIALIS) 20 MG tablet Take 1  tablet (20 mg total) by mouth daily as needed for erectile dysfunction. 10 tablet 11  . Vitamin D, Ergocalciferol, (DRISDOL) 50000 units CAPS capsule Take 50,000 Units by mouth every Monday.      No current facility-administered medications on file prior to visit.     No Known Allergies  Family History  Problem Relation Age of Onset  . Diabetes Mother   . Diabetes Father   . Hypertension Father   . Diabetes Maternal Grandfather   . Diabetes Paternal Grandfather   . Diabetes Brother   . Hypertension Brother     BP (!) 162/102 (BP Location: Left Arm, Patient Position: Sitting, Cuff Size: Normal)   Pulse 70   Ht 6\' 3"  (1.905 m)   Wt 261 lb 12.8 oz (118.8 kg)   SpO2 97%   BMI 32.72 kg/m    Review of Systems He has regained weight.      Objective:   Physical Exam VITAL SIGNS:  See vs page GENERAL: no distress Pulses: dorsalis pedis intact bilat.   MSK: no deformity of the feet.  CV: no leg edema.  Skin:  no ulcer on the feet.  normal color and temp on the feet.  Neuro: sensation is intact to touch on the feet.   Ext: There is bilateral onychomycosis of the toenails.  left great toenail is absent.    Lab Results  Component Value Date   HGBA1C 7.4 (A) 07/20/2018   Lab Results  Component Value Date   CREATININE 2.34 (H) 04/11/2017   BUN 27 (H) 04/11/2017   NA 136 04/11/2017   K 3.9 04/11/2017   CL 101 04/11/2017   CO2 27 04/11/2017      Assessment & Plan:  HTN: is noted today. Insulin-requiring type 2 DM, with PN: this is the best control this pt should aim for, given this regimen, which does match insulin to his changing needs throughout the day Renal insuff: in this setting, he may need a faster-acting qd insulin, but we'll continue the same for now.   Patient Instructions  Your blood pressure is high today.  Please see your kidney doctor soon, to have it rechecked check your blood sugar 2 times a day.  vary the time of day when you check, between before the 3  meals, and at bedtime.  also check if you have symptoms of your blood sugar being too high or too low.  please keep a record of the readings and bring  it to your next appointment here.  please call us sooner if your blood sugar goes below 70, or if you have a lot of readings over 200.   Please come back for a follow-up appointment in 4 months.   Please continue the same insulin.    On this type of insulin schedule, you should eat meals on a regular schedule.  If a meal is missed or significantly delayed, your blood sugar could go low.

## 2018-07-20 NOTE — Patient Instructions (Addendum)
Your blood pressure is high today.  Please see your kidney doctor soon, to have it rechecked check your blood sugar 2 times a day.  vary the time of day when you check, between before the 3 meals, and at bedtime.  also check if you have symptoms of your blood sugar being too high or too low.  please keep a record of the readings and bring it to your next appointment here.  please call us sooner if your blood sugar goes below 70, or if you have a lot of readings over 200.   Please come back for a follow-up appointment in 4 months.   Please continue the same insulin.    On this type of insulin schedule, you should eat meals on a regular schedule.  If a meal is missed or significantly delayed, your blood sugar could go low.

## 2018-08-26 DIAGNOSIS — I129 Hypertensive chronic kidney disease with stage 1 through stage 4 chronic kidney disease, or unspecified chronic kidney disease: Secondary | ICD-10-CM | POA: Diagnosis not present

## 2018-08-26 DIAGNOSIS — N183 Chronic kidney disease, stage 3 (moderate): Secondary | ICD-10-CM | POA: Diagnosis not present

## 2018-08-27 ENCOUNTER — Other Ambulatory Visit: Payer: Self-pay | Admitting: Internal Medicine

## 2018-08-27 DIAGNOSIS — E889 Metabolic disorder, unspecified: Secondary | ICD-10-CM | POA: Diagnosis not present

## 2018-08-27 DIAGNOSIS — N183 Chronic kidney disease, stage 3 (moderate): Secondary | ICD-10-CM | POA: Diagnosis not present

## 2018-08-27 DIAGNOSIS — I129 Hypertensive chronic kidney disease with stage 1 through stage 4 chronic kidney disease, or unspecified chronic kidney disease: Secondary | ICD-10-CM | POA: Diagnosis not present

## 2018-08-27 DIAGNOSIS — E1122 Type 2 diabetes mellitus with diabetic chronic kidney disease: Secondary | ICD-10-CM | POA: Diagnosis not present

## 2018-08-27 DIAGNOSIS — E875 Hyperkalemia: Secondary | ICD-10-CM | POA: Insufficient documentation

## 2018-09-08 ENCOUNTER — Other Ambulatory Visit: Payer: Self-pay | Admitting: Endocrinology

## 2018-11-16 ENCOUNTER — Other Ambulatory Visit: Payer: Self-pay | Admitting: Endocrinology

## 2018-11-23 ENCOUNTER — Ambulatory Visit: Payer: BLUE CROSS/BLUE SHIELD | Admitting: Endocrinology

## 2018-11-24 ENCOUNTER — Ambulatory Visit: Payer: BLUE CROSS/BLUE SHIELD | Admitting: Endocrinology

## 2018-11-24 ENCOUNTER — Encounter: Payer: Self-pay | Admitting: Endocrinology

## 2018-11-24 ENCOUNTER — Other Ambulatory Visit: Payer: Self-pay

## 2018-11-24 VITALS — BP 140/80 | HR 71 | Ht 75.0 in | Wt 262.8 lb

## 2018-11-24 DIAGNOSIS — E1122 Type 2 diabetes mellitus with diabetic chronic kidney disease: Secondary | ICD-10-CM | POA: Diagnosis not present

## 2018-11-24 DIAGNOSIS — N183 Chronic kidney disease, stage 3 unspecified: Secondary | ICD-10-CM

## 2018-11-24 DIAGNOSIS — Z794 Long term (current) use of insulin: Secondary | ICD-10-CM

## 2018-11-24 LAB — POCT GLYCOSYLATED HEMOGLOBIN (HGB A1C): Hemoglobin A1C: 8.3 % — AB (ref 4.0–5.6)

## 2018-11-24 MED ORDER — INSULIN DETEMIR 100 UNIT/ML FLEXPEN: 90.0000 [IU] | PEN_INJECTOR | SUBCUTANEOUS | 3 refills | Status: DC

## 2018-11-24 NOTE — Progress Notes (Signed)
Subjective:    Patient ID: Alexander [name], male    DOB: 10-11-1970, 48 y.o.   MRN: [id number]  HPI Pt returns for f/u of diabetes mellitus:  DM type: insulin-requiring type 2 Dx'ed: 3235 Complications: polyneuropathy, renal insufficiency, and NPDR.   Therapy: insulin since 2013.   DKA: never.  Severe hypoglycemia: never.   Pancreatitis: never.  Other: he declines multiple daily injections.  He declines weight loss surgery.   Interval history: no cbg record, but states cbg's vary from 120-190.  It is in general higher as the day goes on.  pt states he feels well in general.  Pt says he never misses the insulin.  Past Medical History:  Diagnosis Date  . CHEST PAIN 08/28/2009  . DIABETES MELLITUS, TYPE II 03/20/2007  . HYPERCHOLESTEROLEMIA 07/21/2008  . HYPERTENSION 03/20/2007  . Hypertensive urgency 03/27/2015  . Left ventricular dysfunction 03/28/2015   EF 40-45%, no WMA, grade 2 diastolic dysfunction  . Renal disorder     Past Surgical History:  Procedure Laterality Date  . KNEE ARTHROSCOPY Right     Social History   Socioeconomic History  . Marital status: Married    Spouse name: Not on file  . Number of children: Not on file  . Years of education: Not on file  . Highest education level: Not on file  Occupational History  . Occupation: Training and development officer: [employer]  Social Needs  . Financial resource strain: Not on file  . Food insecurity:    Worry: Not on file    Inability: Not on file  . Transportation needs:    Medical: Not on file    Non-medical: Not on file  Tobacco Use  . Smoking status: Never Smoker  . Smokeless tobacco: Never Used  Substance and Sexual Activity  . Alcohol use: No  . Drug use: No  . Sexual activity: Not on file  Lifestyle  . Physical activity:    Days per week: Not on file    Minutes per session: Not on file  . Stress: Not on file  Relationships  . Social connections:    Talks on phone: Not on file    Gets together: Not on  file    Attends religious service: Not on file    Active member of club or organization: Not on file    Attends meetings of clubs or organizations: Not on file    Relationship status: Not on file  . Intimate partner violence:    Fear of current or ex partner: Not on file    Emotionally abused: Not on file    Physically abused: Not on file    Forced sexual activity: Not on file  Other Topics Concern  . Not on file  Social History Narrative  . Not on file    Current Outpatient Medications on File Prior to Visit  Medication Sig Dispense Refill  . acetaminophen (TYLENOL) 500 MG tablet Take 500 mg by mouth every 6 (six) hours as needed for headache (pain).    Marland Kitchen amLODipine (NORVASC) 10 MG tablet Take 1 tablet (10 mg total) by mouth daily. 30 tablet 0  . aspirin EC 81 MG EC tablet Take 1 tablet (81 mg total) by mouth daily.    Marland Kitchen atorvastatin (LIPITOR) 40 MG tablet TAKE 1 TABLET (40 MG TOTAL) BY MOUTH DAILY AT 6 PM. 90 tablet 1  . carvedilol (COREG) 12.5 MG tablet TAKE 1 TABLET (12.5 MG TOTAL) BY MOUTH 2 (TWO) TIMES DAILY  WITH A MEAL. 180 tablet 3  . gabapentin (NEURONTIN) 300 MG capsule Take 300 mg by mouth 3 (three) times daily as needed (neuropathy).     Marland Kitchen glucose blood (FREESTYLE LITE) test strip 1 each by Other route 2 (two) times daily. And lancets 2/day 200 each 3  . hydrALAZINE (APRESOLINE) 100 MG tablet TAKE 1 TABLET BY MOUTH 3 TIMES A DAY 90 tablet 0  . Insulin Pen Needle (BD PEN NEEDLE NANO U/F) 32G X 4 MM MISC USE 1 PEN NEEDLE DAILY TO INJECT LEVEMIR 100 each 3  . Lancets (FREESTYLE) lancets Use to monitor glucose levels BID; E11.22 100 each 12  . lidocaine (LIDODERM) 5 % Place 1 patch onto the skin daily as needed. Remove & Discard patch within 12 hours or as directed by MD 10 patch 0  . methocarbamol (ROBAXIN) 500 MG tablet Take 1 tablet (500 mg total) by mouth every 8 (eight) hours as needed. (Patient taking differently: Take 500 mg by mouth every 8 (eight) hours as needed for  muscle spasms. ) 60 tablet 1  . nitroGLYCERIN (NITROSTAT) 0.4 MG SL tablet Place 1 tablet (0.4 mg total) under the tongue every 5 (five) minutes x 3 doses as needed for chest pain. Don't take within 24 hrs of Cialis 25 tablet 0  . tadalafil (CIALIS) 20 MG tablet Take 1 tablet (20 mg total) by mouth daily as needed for erectile dysfunction. 10 tablet 11  . Vitamin D, Ergocalciferol, (DRISDOL) 50000 units CAPS capsule Take 50,000 Units by mouth every Monday.      No current facility-administered medications on file prior to visit.     No Known Allergies  Family History  Problem Relation Age of Onset  . Diabetes Mother   . Diabetes Father   . Hypertension Father   . Diabetes Maternal Grandfather   . Diabetes Paternal Grandfather   . Diabetes Brother   . Hypertension Brother     BP 140/80 (BP Location: Left Arm, Patient Position: Sitting, Cuff Size: Large)   Pulse 71   Ht 6\' 3"  (1.905 m)   Wt 262 lb 12.8 oz (119.2 kg)   SpO2 98%   BMI 32.85 kg/m    Review of Systems He denies hypoglycemia.  He has gained a few lbs.      Objective:   Physical Exam VITAL SIGNS:  See vs page GENERAL: no distress Pulses: dorsalis pedis intact bilat.   MSK: no deformity of the feet CV: no leg edema Skin:  no ulcer on the feet.  normal color and temp on the feet. Neuro: sensation is intact to touch on the feet   Lab Results  Component Value Date   HGBA1C 8.3 (A) 11/24/2018   Lab Results  Component Value Date   CREATININE 2.34 (H) 04/11/2017   BUN 27 (H) 04/11/2017   NA 136 04/11/2017   K 3.9 04/11/2017   CL 101 04/11/2017   CO2 27 04/11/2017       Assessment & Plan:  HTN: is noted today Insulin-requiring type 2 DM, with PN: worse Renal failure: in this setting, she may need a faster-acting qd insulin, but we'll first try increasing levemir for now.  Patient Instructions  Your blood pressure is high today.  Please see your primary care provider soon, to have it rechecked.    Please increase the insulin to 90 units each morning.  check your blood sugar twice a day.  vary the time of day when you check, between before the  3 meals, and at bedtime.  also check if you have symptoms of your blood sugar being too high or too low.  please keep a record of the readings and bring it to your next appointment here (or you can bring the meter itself).  You can write it on any piece of paper.  please call us sooner if your blood sugar goes below 70, or if you have a lot of readings over 200. Please come back for a follow-up appointment in 2 months.

## 2018-11-24 NOTE — Patient Instructions (Signed)
Your blood pressure is high today.  Please see your primary care provider soon, to have it rechecked.   Please increase the insulin to 90 units each morning.  check your blood sugar twice a day.  vary the time of day when you check, between before the 3 meals, and at bedtime.  also check if you have symptoms of your blood sugar being too high or too low.  please keep a record of the readings and bring it to your next appointment here (or you can bring the meter itself).  You can write it on any piece of paper.  please call us sooner if your blood sugar goes below 70, or if you have a lot of readings over 200. Please come back for a follow-up appointment in 2 months.

## 2018-12-26 DIAGNOSIS — Z794 Long term (current) use of insulin: Secondary | ICD-10-CM | POA: Diagnosis not present

## 2018-12-26 DIAGNOSIS — E559 Vitamin D deficiency, unspecified: Secondary | ICD-10-CM | POA: Diagnosis not present

## 2018-12-26 DIAGNOSIS — Z79899 Other long term (current) drug therapy: Secondary | ICD-10-CM | POA: Diagnosis not present

## 2018-12-26 DIAGNOSIS — E1122 Type 2 diabetes mellitus with diabetic chronic kidney disease: Secondary | ICD-10-CM | POA: Diagnosis not present

## 2018-12-26 DIAGNOSIS — I129 Hypertensive chronic kidney disease with stage 1 through stage 4 chronic kidney disease, or unspecified chronic kidney disease: Secondary | ICD-10-CM | POA: Diagnosis not present

## 2018-12-26 DIAGNOSIS — D631 Anemia in chronic kidney disease: Secondary | ICD-10-CM | POA: Diagnosis not present

## 2018-12-26 DIAGNOSIS — E8889 Other specified metabolic disorders: Secondary | ICD-10-CM | POA: Diagnosis not present

## 2018-12-26 DIAGNOSIS — N183 Chronic kidney disease, stage 3 (moderate): Secondary | ICD-10-CM | POA: Diagnosis not present

## 2018-12-26 DIAGNOSIS — E1142 Type 2 diabetes mellitus with diabetic polyneuropathy: Secondary | ICD-10-CM | POA: Diagnosis not present

## 2018-12-26 DIAGNOSIS — E875 Hyperkalemia: Secondary | ICD-10-CM | POA: Diagnosis not present

## 2018-12-28 DIAGNOSIS — N183 Chronic kidney disease, stage 3 (moderate): Secondary | ICD-10-CM | POA: Diagnosis not present

## 2018-12-28 DIAGNOSIS — E889 Metabolic disorder, unspecified: Secondary | ICD-10-CM | POA: Diagnosis not present

## 2018-12-28 DIAGNOSIS — E1122 Type 2 diabetes mellitus with diabetic chronic kidney disease: Secondary | ICD-10-CM | POA: Diagnosis not present

## 2018-12-28 DIAGNOSIS — I129 Hypertensive chronic kidney disease with stage 1 through stage 4 chronic kidney disease, or unspecified chronic kidney disease: Secondary | ICD-10-CM | POA: Diagnosis not present

## 2019-01-25 ENCOUNTER — Ambulatory Visit: Payer: BLUE CROSS/BLUE SHIELD | Admitting: Endocrinology

## 2019-01-27 ENCOUNTER — Ambulatory Visit: Payer: BLUE CROSS/BLUE SHIELD | Admitting: Endocrinology

## 2019-03-19 ENCOUNTER — Other Ambulatory Visit: Payer: Self-pay | Admitting: Internal Medicine

## 2019-05-31 ENCOUNTER — Ambulatory Visit: Payer: BLUE CROSS/BLUE SHIELD | Admitting: Endocrinology

## 2019-05-31 ENCOUNTER — Other Ambulatory Visit: Payer: Self-pay

## 2019-05-31 DIAGNOSIS — I129 Hypertensive chronic kidney disease with stage 1 through stage 4 chronic kidney disease, or unspecified chronic kidney disease: Secondary | ICD-10-CM | POA: Diagnosis not present

## 2019-05-31 DIAGNOSIS — N183 Chronic kidney disease, stage 3 unspecified: Secondary | ICD-10-CM | POA: Diagnosis not present

## 2019-06-01 DIAGNOSIS — N1832 Chronic kidney disease, stage 3b: Secondary | ICD-10-CM | POA: Diagnosis not present

## 2019-06-01 DIAGNOSIS — I129 Hypertensive chronic kidney disease with stage 1 through stage 4 chronic kidney disease, or unspecified chronic kidney disease: Secondary | ICD-10-CM | POA: Diagnosis not present

## 2019-06-01 DIAGNOSIS — E889 Metabolic disorder, unspecified: Secondary | ICD-10-CM | POA: Diagnosis not present

## 2019-06-01 DIAGNOSIS — E1122 Type 2 diabetes mellitus with diabetic chronic kidney disease: Secondary | ICD-10-CM | POA: Diagnosis not present

## 2019-06-02 ENCOUNTER — Other Ambulatory Visit: Payer: Self-pay

## 2019-06-02 ENCOUNTER — Ambulatory Visit: Payer: BC Managed Care – PPO | Admitting: Endocrinology

## 2019-06-02 ENCOUNTER — Encounter: Payer: Self-pay | Admitting: Endocrinology

## 2019-06-02 VITALS — BP 148/82 | HR 73 | Temp 99.2°F | Ht 75.0 in | Wt 258.6 lb

## 2019-06-02 DIAGNOSIS — N189 Chronic kidney disease, unspecified: Secondary | ICD-10-CM

## 2019-06-02 DIAGNOSIS — E1122 Type 2 diabetes mellitus with diabetic chronic kidney disease: Secondary | ICD-10-CM

## 2019-06-02 DIAGNOSIS — Z794 Long term (current) use of insulin: Secondary | ICD-10-CM

## 2019-06-02 DIAGNOSIS — N183 Chronic kidney disease, stage 3 unspecified: Secondary | ICD-10-CM

## 2019-06-02 LAB — POCT GLYCOSYLATED HEMOGLOBIN (HGB A1C): Hemoglobin A1C: 6.8 % — AB (ref 4.0–5.6)

## 2019-06-02 MED ORDER — LEVEMIR FLEXTOUCH 100 UNIT/ML ~~LOC~~ SOPN: 85.0000 [IU] | PEN_INJECTOR | SUBCUTANEOUS | 3 refills | Status: DC

## 2019-06-02 NOTE — Progress Notes (Signed)
Subjective:    Patient ID: Alexander [name], male    DOB: Oct 08, 1970, 48 y.o.   MRN: [id number]  HPI Pt returns for f/u of diabetes mellitus:  DM type: insulin-requiring type 2 Dx'ed: 1829 Complications: polyneuropathy, renal insufficiency, and NPDR.   Therapy: insulin since 2013.   DKA: never.  Severe hypoglycemia: never.   Pancreatitis: never.  Other: he declines multiple daily injections.  He declines weight loss surgery.   Interval history: no cbg record, but states cbg's vary from 100-200.  pt states he feels well in general.  Pt says he seldom misses the insulin.  Past Medical History:  Diagnosis Date  . CHEST PAIN 08/28/2009  . DIABETES MELLITUS, TYPE II 03/20/2007  . HYPERCHOLESTEROLEMIA 07/21/2008  . HYPERTENSION 03/20/2007  . Hypertensive urgency 03/27/2015  . Left ventricular dysfunction 03/28/2015   EF 40-45%, no WMA, grade 2 diastolic dysfunction  . Renal disorder     Past Surgical History:  Procedure Laterality Date  . KNEE ARTHROSCOPY Right     Social History   Socioeconomic History  . Marital status: Married    Spouse name: Not on file  . Number of children: Not on file  . Years of education: Not on file  . Highest education level: Not on file  Occupational History  . Occupation: Training and development officer: [employer]  Social Needs  . Financial resource strain: Not on file  . Food insecurity    Worry: Not on file    Inability: Not on file  . Transportation needs    Medical: Not on file    Non-medical: Not on file  Tobacco Use  . Smoking status: Never Smoker  . Smokeless tobacco: Never Used  Substance and Sexual Activity  . Alcohol use: No  . Drug use: No  . Sexual activity: Not on file  Lifestyle  . Physical activity    Days per week: Not on file    Minutes per session: Not on file  . Stress: Not on file  Relationships  . Social Herbalist on phone: Not on file    Gets together: Not on file    Attends religious service: Not on file   Active member of club or organization: Not on file    Attends meetings of clubs or organizations: Not on file    Relationship status: Not on file  . Intimate partner violence    Fear of current or ex partner: Not on file    Emotionally abused: Not on file    Physically abused: Not on file    Forced sexual activity: Not on file  Other Topics Concern  . Not on file  Social History Narrative  . Not on file    Current Outpatient Medications on File Prior to Visit  Medication Sig Dispense Refill  . acetaminophen (TYLENOL) 500 MG tablet Take 500 mg by mouth every 6 (six) hours as needed for headache (pain).    Marland Kitchen amLODipine (NORVASC) 10 MG tablet Take 1 tablet (10 mg total) by mouth daily. 30 tablet 0  . aspirin EC 81 MG EC tablet Take 1 tablet (81 mg total) by mouth daily.    Marland Kitchen atorvastatin (LIPITOR) 40 MG tablet TAKE 1 TABLET (40 MG TOTAL) BY MOUTH DAILY AT 6 PM. 90 tablet 1  . carvedilol (COREG) 12.5 MG tablet TAKE 1 TABLET (12.5 MG TOTAL) BY MOUTH 2 (TWO) TIMES DAILY WITH A MEAL. 180 tablet 3  . gabapentin (NEURONTIN) 300 MG capsule  Take 300 mg by mouth 3 (three) times daily as needed (neuropathy).     Marland Kitchen glucose blood (FREESTYLE LITE) test strip 1 each by Other route 2 (two) times daily. And lancets 2/day 200 each 3  . hydrALAZINE (APRESOLINE) 100 MG tablet TAKE 1 TABLET BY MOUTH 3 TIMES A DAY 90 tablet 0  . Insulin Pen Needle (BD PEN NEEDLE NANO U/F) 32G X 4 MM MISC USE 1 PEN NEEDLE DAILY TO INJECT LEVEMIR 100 each 3  . Lancets (FREESTYLE) lancets Use to monitor glucose levels BID; E11.22 100 each 12  . lidocaine (LIDODERM) 5 % Place 1 patch onto the skin daily as needed. Remove & Discard patch within 12 hours or as directed by MD 10 patch 0  . methocarbamol (ROBAXIN) 500 MG tablet Take 1 tablet (500 mg total) by mouth every 8 (eight) hours as needed. (Patient taking differently: Take 500 mg by mouth every 8 (eight) hours as needed for muscle spasms. ) 60 tablet 1  . nitroGLYCERIN  (NITROSTAT) 0.4 MG SL tablet Place 1 tablet (0.4 mg total) under the tongue every 5 (five) minutes x 3 doses as needed for chest pain. Don't take within 24 hrs of Cialis 25 tablet 0  . tadalafil (CIALIS) 20 MG tablet Take 1 tablet (20 mg total) by mouth daily as needed for erectile dysfunction. 10 tablet 11  . Vitamin D, Ergocalciferol, (DRISDOL) 50000 units CAPS capsule Take 50,000 Units by mouth every Monday.      No current facility-administered medications on file prior to visit.     No Known Allergies  Family History  Problem Relation Age of Onset  . Diabetes Mother   . Diabetes Father   . Hypertension Father   . Diabetes Maternal Grandfather   . Diabetes Paternal Grandfather   . Diabetes Brother   . Hypertension Brother     BP (!) 148/82 (BP Location: Left Arm, Patient Position: Sitting, Cuff Size: Large)   Pulse 73   Temp 99.2 F (37.3 C)   Ht 6\' 3"  (1.905 m)   Wt 258 lb 9.6 oz (117.3 kg)   SpO2 99%   BMI 32.32 kg/m    Review of Systems He denies hypoglycemia.     Objective:   Physical Exam VITAL SIGNS:  See vs page GENERAL: no distress Pulses: dorsalis pedis intact bilat.   MSK: no deformity of the feet CV: no leg edema Skin:  no ulcer on the feet.  normal color and temp on the feet. Neuro: sensation is intact to touch on the feet  A1c=6.8%  Lab Results  Component Value Date   CREATININE 2.34 (H) 04/11/2017   BUN 27 (H) 04/11/2017   NA 136 04/11/2017   K 3.9 04/11/2017   CL 101 04/11/2017   CO2 27 04/11/2017      Assessment & Plan:  HTN: is noted today Insulin-requiring type 2 DM, with DR: overcontrolled, given this regimen, which does match insulin to his changing needs throughout the day Renal failure: in this setting, he may need a faster-acting qd insulin, but more cbg info is needed to tell.   Patient Instructions  Your blood pressure is high today.  Please follow this up with your kidney doctor.   Please reduce the insulin to 85 units each  morning.   check your blood sugar twice a day.  vary the time of day when you check, between before the 3 meals, and at bedtime.  also check if you have symptoms of your  blood sugar being too high or too low.  please keep a record of the readings and bring it to your next appointment here (or you can bring the meter itself).  You can write it on any piece of paper.  please call us sooner if your blood sugar goes below 70, or if you have a lot of readings over 200. Please come back for a follow-up appointment in 3 months.

## 2019-06-02 NOTE — Patient Instructions (Addendum)
Your blood pressure is high today.  Please follow this up with your kidney doctor.   Please reduce the insulin to 85 units each morning.   check your blood sugar twice a day.  vary the time of day when you check, between before the 3 meals, and at bedtime.  also check if you have symptoms of your blood sugar being too high or too low.  please keep a record of the readings and bring it to your next appointment here (or you can bring the meter itself).  You can write it on any piece of paper.  please call us sooner if your blood sugar goes below 70, or if you have a lot of readings over 200. Please come back for a follow-up appointment in 3 months.

## 2019-07-14 ENCOUNTER — Other Ambulatory Visit: Payer: Self-pay

## 2019-07-14 ENCOUNTER — Ambulatory Visit (INDEPENDENT_AMBULATORY_CARE_PROVIDER_SITE_OTHER): Payer: BC Managed Care – PPO | Admitting: Internal Medicine

## 2019-07-14 ENCOUNTER — Encounter: Payer: Self-pay | Admitting: Internal Medicine

## 2019-07-14 VITALS — BP 191/97 | HR 64 | Ht 75.0 in | Wt 266.0 lb

## 2019-07-14 DIAGNOSIS — I10 Essential (primary) hypertension: Secondary | ICD-10-CM

## 2019-07-14 DIAGNOSIS — E785 Hyperlipidemia, unspecified: Secondary | ICD-10-CM

## 2019-07-14 DIAGNOSIS — N183 Chronic kidney disease, stage 3 unspecified: Secondary | ICD-10-CM | POA: Diagnosis not present

## 2019-07-14 NOTE — Progress Notes (Signed)
Cardiology Office Note   Date:  07/14/2019   ID:  Laural Golden, DOB 08/22/1970, MRN [id number]  PCP:  Hoyt Koch, MD  Cardiologist:  Dr Charma Igo, MD   No chief complaint on file. No complaints  History of Present Illness: Alexander [name] is a 49 y.o. male with a history of DM, HL, HTN, d/c 09/22 after hospitalization for atyp CP, hypertensive urgency, EF 40-45% by echo w/ gr 2 DD, MV w/ small defect EF 33%. ARF on CKD. He was recently seen by [name] and medications were adjusted. It was noted that his creatinine had risen and he was taken off of ACE inhibitor and a increase of his carvedilol was made. His blood pressure today is very well controlled in the office at 122/76, however I reviewed a blood pressure log from home which indicated varying blood pressures between 812X and 517G systolic as well as diastolics between 90 and 017 at times. This suggests that it may not be as well controlled as we suspect. Is not clear what would be causing his labile blood pressures. He is on 3 times a day hydralazine which he takes without fail. There could be a waning effect of the medication. He does have some chronic kidney disease and another possibility could be some degree of fibromuscular dysplasia or renal artery stenosis. I do not believe this is been evaluated. Of note he had a repeat echocardiogram pleased to say that his EF is now come back to 55-60%. He did have a low risk Myoview in the hospital, but his ejection fraction was reduced. Currently he is asymptomatic and denies chest pain or worsening shortness of breath.  07/12/2016  Alexander [name] returns today for follow-up. He reports feeling well and generally feels like his blood pressure is somewhat elevated although recently saw a nephrologist in [facility] and he felt like his blood pressure was well controlled. Today's blood pressure is 157/82, however I rechecked the blood pressure and it came back at  120/76. We then checked his blood pressure with his home cuff which was 135/76. There may be a slight discrepancy with a favor to higher blood pressure of his home cuff. In general I feel that his blood pressure control is very good and may need to be tweaked somewhat.  01/09/2017  Alexander [name] was seen today in follow-up. Overall he feels well. He denies a chest pain or worsening shortness of breath. He has had interim improvement in LVEF back to normal. His blood pressure has been better controlled. Today's 135/78. He's working with his nephrologist. Creatinine is recently 2.2. He has been complaining of some heaviness in his leg when he walks. Is not necessarily crampy pain or burning. It comes and goes with exertion without. He does have a history of some peripheral neuropathy and was supposed to be on gabapentin but has not taken it due to some fear of the medication. He is on a atorvastatin. His most recent lipid profile shows an LDL-C of 81, but previously he was well controlled at 61.  04/16/2018  Alexander [name] returns today for follow-up.  He is doing well.  He denies chest pain or any shortness of breath.  EKG shows sinus rhythm with nonspecific T wave changes.  Blood pressure is 134/80.  07/14/2019  Alexander [name] returns today for follow-up.  Overall he is doing well though blood pressure was noted to be elevated today.  Recent labs showed LDL 81  hemoglobin A1c of 6.8 and creatinine had increased from 2.3-2.74.  He is working with a nephrologist Dr. Loanne Drilling on his diabetes.  EKG shows sinus rhythm with some nonspecific T wave changes.  Past Medical History:  Diagnosis Date  . CHEST PAIN 08/28/2009  . DIABETES MELLITUS, TYPE II 03/20/2007  . HYPERCHOLESTEROLEMIA 07/21/2008  . HYPERTENSION 03/20/2007  . Hypertensive urgency 03/27/2015  . Left ventricular dysfunction 03/28/2015   EF 40-45%, no WMA, grade 2 diastolic dysfunction  . Renal disorder     Past Surgical History:  Procedure  Laterality Date  . KNEE ARTHROSCOPY Right     Current Outpatient Prescriptions  Medication Sig Dispense Refill  . aspirin EC 81 MG EC tablet Take 1 tablet (81 mg total) by mouth daily.    Marland Kitchen atorvastatin (LIPITOR) 40 MG tablet Take 1 tablet (40 mg total) by mouth daily at 6 PM. 90 tablet 3  . carvedilol (COREG) 6.25 MG tablet Take 1 tablet (6.25 mg total) by mouth 2 (two) times daily with a meal. 60 tablet 5  . hydrALAZINE (APRESOLINE) 100 MG tablet Take 1 tablet (100 mg total) by mouth every 8 (eight) hours. 90 tablet 5  . insulin detemir (LEVEMIR) 100 UNIT/ML injection Inject 2 mLs (200 Units total) into the skin every morning. (Patient taking differently: Inject 200 Units into the skin every morning. Patient states he is taking 200 units daily per patient) 70 mL 11  . lisinopril (PRINIVIL,ZESTRIL) 20 MG tablet Take 1 tablet (20 mg total) by mouth daily. 90 tablet 3  . nitroGLYCERIN (NITROSTAT) 0.4 MG SL tablet Place 1 tablet (0.4 mg total) under the tongue every 5 (five) minutes x 3 doses as needed for chest pain. Don't take within 24 hrs of Cialis 25 tablet 3  . tadalafil (CIALIS) 20 MG tablet Take 1 tablet (20 mg total) by mouth daily as needed for erectile dysfunction. 10 tablet 11   No current facility-administered medications for this visit.    Allergies:   Patient has no known allergies.    Social History:  The patient  reports that he has never smoked. He has never used smokeless tobacco. He reports that he does not drink alcohol or use drugs.   Family History:  The patient's family history includes Diabetes in his brother, father, maternal grandfather, mother, and paternal grandfather; Hypertension in his brother and father.    ROS:  Please see the history of present illness. All other systems are reviewed and negative.    PHYSICAL EXAM: VS:  BP (!) 191/97   Pulse 64   Ht 6\' 3"  (1.905 m)   Wt 266 lb (120.7 kg)   SpO2 99%   BMI 33.25 kg/m  , BMI Body mass index is 33.25  kg/m. General appearance: alert and no distress Neck: no carotid bruit, no JVD and thyroid not enlarged, symmetric, no tenderness/mass/nodules Lungs: clear to auscultation bilaterally Heart: regular rate and rhythm, S1, S2 normal, no murmur, click, rub or gallop Abdomen: soft, non-tender; bowel sounds normal; no masses,  no organomegaly Extremities: extremities normal, atraumatic, no cyanosis or edema Pulses: 2+ and symmetric Skin: Skin color, texture, turgor normal. No rashes or lesions Neurologic: Grossly normal Psych: Pleasant  EKG:   Sinus rhythm at 64, lateral T wave changes-personally reviewed  Recent Labs: No results found for requested labs within last 8760 hours.    Lipid Panel    Component Value Date/Time   CHOL 138 10/11/2016 1545   TRIG 83.0 10/11/2016 1545   HDL  40.40 10/11/2016 1545   CHOLHDL 3 10/11/2016 1545   VLDL 16.6 10/11/2016 1545   LDLCALC 81 10/11/2016 1545   LDLDIRECT 164.0 07/15/2014 1703     Wt Readings from Last 3 Encounters:  07/14/19 266 lb (120.7 kg)  06/02/19 258 lb 9.6 oz (117.3 kg)  11/24/18 262 lb 12.8 oz (119.2 kg)     ASSESSMENT:  1. Type 2 diabetes on insulin 2. Labile hypertension 3. Recent systolic congestive heart failure with improvement in LVEF to 55-60% 4. Hypertensive heart disease with heart failure 5. Hypertensive nephropathy - CKD3  PLAN: 1. Alexander [name] seems to be doing well and is asymptomatic although blood pressure was markedly elevated today.  Repeat blood pressure did improve and he says at home in general his blood pressure is better controlled.  He should follow-up with his nephrologist on this.  No changes to his medicines today.  Plan follow-up in 6 months or sooner as necessary.  We will obtain lab work including a lipid profile contact him with those results.  [name], MD, [facility], [place] Director of the Advanced Lipid Disorders &  Cardiovascular Risk Reduction  Clinic Diplomate of the American Board of Clinical Lipidology Attending Cardiologist  Direct Dial: [phone]  Fax: [phone]  Website:  [email or web address]   [name], MD  07/14/2019 3:29 PM

## 2019-07-14 NOTE — Patient Instructions (Signed)
Medication Instructions:  No Changes *If you need a refill on your cardiac medications before your next appointment, please call your pharmacy*  Lab Work: Your physician recommends that you return for lab work this week (LIPID) If you have labs (blood work) drawn today and your tests are completely normal, you will receive your results only by: Marland Kitchen MyChart Message (if you have MyChart) OR . A paper copy in the mail If you have any lab test that is abnormal or we need to change your treatment, we will call you to review the results.  Testing/Procedures: None ordered  Follow-Up: At [facility], you and your health needs are our priority.  As part of our continuing mission to provide you with exceptional heart care, we have created designated Provider Care Teams.  These Care Teams include your primary Cardiologist (physician) and Advanced Practice Providers (APPs -  Physician Assistants and Nurse Practitioners) who all work together to provide you with the care you need, when you need it.  Your next appointment:   6 month(s)  The format for your next appointment:   In Person  Provider:   K. Mali Hilty, MD

## 2019-07-15 ENCOUNTER — Encounter: Payer: Self-pay | Admitting: Internal Medicine

## 2019-07-27 DIAGNOSIS — M79671 Pain in right foot: Secondary | ICD-10-CM | POA: Diagnosis not present

## 2019-07-27 DIAGNOSIS — L600 Ingrowing nail: Secondary | ICD-10-CM | POA: Diagnosis not present

## 2019-07-27 DIAGNOSIS — E785 Hyperlipidemia, unspecified: Secondary | ICD-10-CM | POA: Diagnosis not present

## 2019-07-27 DIAGNOSIS — M2041 Other hammer toe(s) (acquired), right foot: Secondary | ICD-10-CM | POA: Diagnosis not present

## 2019-07-27 LAB — LIPID PANEL
Chol/HDL Ratio: 3.2 ratio (ref 0.0–5.0)
Cholesterol, Total: 152 mg/dL (ref 100–199)
HDL: 47 mg/dL (ref >39)
LDL Chol Calc (NIH): 88 mg/dL (ref 0–99)
Triglycerides: 91 mg/dL (ref 0–149)
VLDL Cholesterol Cal: 17 mg/dL (ref 5–40)

## 2019-09-03 ENCOUNTER — Other Ambulatory Visit: Payer: Self-pay

## 2019-09-04 ENCOUNTER — Other Ambulatory Visit: Payer: Self-pay | Admitting: Internal Medicine

## 2019-09-07 ENCOUNTER — Ambulatory Visit: Payer: BC Managed Care – PPO | Admitting: Endocrinology

## 2019-09-07 ENCOUNTER — Other Ambulatory Visit: Payer: Self-pay

## 2019-09-07 ENCOUNTER — Encounter: Payer: Self-pay | Admitting: Endocrinology

## 2019-09-07 VITALS — BP 122/70 | HR 80 | Ht 75.0 in | Wt 262.0 lb

## 2019-09-07 DIAGNOSIS — E1122 Type 2 diabetes mellitus with diabetic chronic kidney disease: Secondary | ICD-10-CM | POA: Diagnosis not present

## 2019-09-07 DIAGNOSIS — N183 Chronic kidney disease, stage 3 unspecified: Secondary | ICD-10-CM | POA: Diagnosis not present

## 2019-09-07 DIAGNOSIS — Z794 Long term (current) use of insulin: Secondary | ICD-10-CM

## 2019-09-07 DIAGNOSIS — E119 Type 2 diabetes mellitus without complications: Secondary | ICD-10-CM | POA: Diagnosis not present

## 2019-09-07 LAB — POCT GLYCOSYLATED HEMOGLOBIN (HGB A1C): Hemoglobin A1C: 7.9 % — AB (ref 4.0–5.6)

## 2019-09-07 LAB — HM DIABETES EYE EXAM

## 2019-09-07 NOTE — Patient Instructions (Addendum)
Please continue the same insulin.   check your blood sugar twice a day.  vary the time of day when you check, between before the 3 meals, and at bedtime.  also check if you have symptoms of your blood sugar being too high or too low.  please keep a record of the readings and bring it to your next appointment here (or you can bring the meter itself).  You can write it on any piece of paper.  please call us sooner if your blood sugar goes below 70, or if you have a lot of readings over 200.  Please come back for a follow-up appointment in 3 months.     

## 2019-09-07 NOTE — Progress Notes (Signed)
Subjective:    Patient ID: Alexander [name], male    DOB: 1971-03-12, 49 y.o.   MRN: [id number]  HPI Pt returns for f/u of diabetes mellitus:  DM type: insulin-requiring type 2 Dx'ed: 7829 Complications: polyneuropathy, renal insufficiency, and NPDR.   Therapy: insulin since 2013.   DKA: never.  Severe hypoglycemia: never.   Pancreatitis: never.  Other: he declines multiple daily injections.  He declines weight loss surgery.   Interval history: no cbg record, but states cbg's vary from 120-160.  There is no trend throughout the day.  pt states he feels well in general.  Pt says he seldom misses the insulin.  Past Medical History:  Diagnosis Date  . CHEST PAIN 08/28/2009  . DIABETES MELLITUS, TYPE II 03/20/2007  . HYPERCHOLESTEROLEMIA 07/21/2008  . HYPERTENSION 03/20/2007  . Hypertensive urgency 03/27/2015  . Left ventricular dysfunction 03/28/2015   EF 40-45%, no WMA, grade 2 diastolic dysfunction  . Renal disorder     Past Surgical History:  Procedure Laterality Date  . KNEE ARTHROSCOPY Right     Social History   Socioeconomic History  . Marital status: Married    Spouse name: Not on file  . Number of children: Not on file  . Years of education: Not on file  . Highest education level: Not on file  Occupational History  . Occupation: Training and development officer: [employer]  Tobacco Use  . Smoking status: Never Smoker  . Smokeless tobacco: Never Used  Substance and Sexual Activity  . Alcohol use: No  . Drug use: No  . Sexual activity: Not on file  Other Topics Concern  . Not on file  Social History Narrative  . Not on file   Social Determinants of Health   Financial Resource Strain:   . Difficulty of Paying Living Expenses: Not on file  Food Insecurity:   . Worried About Charity fundraiser in the Last Year: Not on file  . Ran Out of Food in the Last Year: Not on file  Transportation Needs:   . Lack of Transportation (Medical): Not on file  . Lack of Transportation  (Non-Medical): Not on file  Physical Activity:   . Days of Exercise per Week: Not on file  . Minutes of Exercise per Session: Not on file  Stress:   . Feeling of Stress : Not on file  Social Connections:   . Frequency of Communication with Friends and Family: Not on file  . Frequency of Social Gatherings with Friends and Family: Not on file  . Attends Religious Services: Not on file  . Active Member of Clubs or Organizations: Not on file  . Attends Archivist Meetings: Not on file  . Marital Status: Not on file  Intimate Partner Violence:   . Fear of Current or Ex-Partner: Not on file  . Emotionally Abused: Not on file  . Physically Abused: Not on file  . Sexually Abused: Not on file    Current Outpatient Medications on File Prior to Visit  Medication Sig Dispense Refill  . acetaminophen (TYLENOL) 500 MG tablet Take 500 mg by mouth every 6 (six) hours as needed for headache (pain).    Marland Kitchen amLODipine (NORVASC) 10 MG tablet Take 1 tablet (10 mg total) by mouth daily. 30 tablet 0  . aspirin EC 81 MG EC tablet Take 1 tablet (81 mg total) by mouth daily.    Marland Kitchen atorvastatin (LIPITOR) 40 MG tablet Take 1 tablet (40 mg total) by  mouth daily at 6 PM. 90 tablet 1  . carvedilol (COREG) 12.5 MG tablet TAKE 1 TABLET (12.5 MG TOTAL) BY MOUTH 2 (TWO) TIMES DAILY WITH A MEAL. 180 tablet 3  . gabapentin (NEURONTIN) 300 MG capsule Take 300 mg by mouth 3 (three) times daily as needed (neuropathy).     Marland Kitchen glucose blood (FREESTYLE LITE) test strip 1 each by Other route 2 (two) times daily. And lancets 2/day 200 each 3  . hydrALAZINE (APRESOLINE) 100 MG tablet TAKE 1 TABLET BY MOUTH 3 TIMES A DAY 90 tablet 0  . Insulin Detemir (LEVEMIR FLEXTOUCH) 100 UNIT/ML Pen Inject 85 Units into the skin every morning. 35 pen 3  . Insulin Pen Needle (BD PEN NEEDLE NANO U/F) 32G X 4 MM MISC USE 1 PEN NEEDLE DAILY TO INJECT LEVEMIR 100 each 3  . Lancets (FREESTYLE) lancets Use to monitor glucose levels BID; E11.22  100 each 12  . lidocaine (LIDODERM) 5 % Place 1 patch onto the skin daily as needed. Remove & Discard patch within 12 hours or as directed by MD 10 patch 0  . methocarbamol (ROBAXIN) 500 MG tablet Take 1 tablet (500 mg total) by mouth every 8 (eight) hours as needed. (Patient taking differently: Take 500 mg by mouth every 8 (eight) hours as needed for muscle spasms. ) 60 tablet 1  . nitroGLYCERIN (NITROSTAT) 0.4 MG SL tablet Place 1 tablet (0.4 mg total) under the tongue every 5 (five) minutes x 3 doses as needed for chest pain. Don't take within 24 hrs of Cialis 25 tablet 0  . tadalafil (CIALIS) 20 MG tablet Take 1 tablet (20 mg total) by mouth daily as needed for erectile dysfunction. 10 tablet 11  . Vitamin D, Ergocalciferol, (DRISDOL) 50000 units CAPS capsule Take 50,000 Units by mouth every Monday.      No current facility-administered medications on file prior to visit.    No Known Allergies  Family History  Problem Relation Age of Onset  . Diabetes Mother   . Diabetes Father   . Hypertension Father   . Diabetes Maternal Grandfather   . Diabetes Paternal Grandfather   . Diabetes Brother   . Hypertension Brother     BP 122/70 (BP Location: Left Arm, Patient Position: Sitting, Cuff Size: Large)   Pulse 80   Ht 6\' 3"  (1.905 m)   Wt 262 lb (118.8 kg)   SpO2 98%   BMI 32.75 kg/m    Review of Systems He denies hypoglycemia    Objective:   Physical Exam VITAL SIGNS:  See vs page GENERAL: no distress Pulses: dorsalis pedis intact bilat.   MSK: no deformity of the feet CV: no leg edema Skin:  no ulcer on the feet.  normal color and temp on the feet. Neuro: sensation is intact to touch on the feet, but decreased from normal.  Ext: there is bilateral onychomycosis of the toenails  Lab Results  Component Value Date   HGBA1C 7.9 (A) 09/07/2019   Lab Results  Component Value Date   CREATININE 2.34 (H) 04/11/2017   BUN 27 (H) 04/11/2017   NA 136 04/11/2017   K 3.9  04/11/2017   CL 101 04/11/2017   CO2 27 04/11/2017       Assessment & Plan:  Insulin-requiring type 2 DM, with PN: worse.  he declines to increase insulin.   Renal failure: in this setting, he needs an intermed-release qam insulin  Patient Instructions  Please continue the same insulin.  check  your blood sugar twice a day.  vary the time of day when you check, between before the 3 meals, and at bedtime.  also check if you have symptoms of your blood sugar being too high or too low.  please keep a record of the readings and bring it to your next appointment here (or you can bring the meter itself).  You can write it on any piece of paper.  please call us sooner if your blood sugar goes below 70, or if you have a lot of readings over 200. Please come back for a follow-up appointment in 3 months.

## 2019-09-18 ENCOUNTER — Other Ambulatory Visit: Payer: Self-pay | Admitting: Internal Medicine

## 2019-11-15 DIAGNOSIS — N183 Chronic kidney disease, stage 3 unspecified: Secondary | ICD-10-CM | POA: Diagnosis not present

## 2019-11-15 DIAGNOSIS — I129 Hypertensive chronic kidney disease with stage 1 through stage 4 chronic kidney disease, or unspecified chronic kidney disease: Secondary | ICD-10-CM | POA: Diagnosis not present

## 2019-11-16 DIAGNOSIS — N1832 Chronic kidney disease, stage 3b: Secondary | ICD-10-CM | POA: Diagnosis not present

## 2019-11-16 DIAGNOSIS — E1122 Type 2 diabetes mellitus with diabetic chronic kidney disease: Secondary | ICD-10-CM | POA: Diagnosis not present

## 2019-11-16 DIAGNOSIS — E559 Vitamin D deficiency, unspecified: Secondary | ICD-10-CM | POA: Diagnosis not present

## 2019-11-16 DIAGNOSIS — I129 Hypertensive chronic kidney disease with stage 1 through stage 4 chronic kidney disease, or unspecified chronic kidney disease: Secondary | ICD-10-CM | POA: Diagnosis not present

## 2019-11-16 DIAGNOSIS — R801 Persistent proteinuria, unspecified: Secondary | ICD-10-CM | POA: Insufficient documentation

## 2019-11-26 ENCOUNTER — Telehealth: Payer: Self-pay | Admitting: Internal Medicine

## 2019-11-26 NOTE — Telephone Encounter (Signed)
Attempted to call patient 11/26/19 to get scheduled for follow up visit, no answer unable to leave voicemail, mailbox was full

## 2019-12-07 ENCOUNTER — Other Ambulatory Visit: Payer: Self-pay

## 2019-12-07 ENCOUNTER — Encounter: Payer: Self-pay | Admitting: Endocrinology

## 2019-12-07 ENCOUNTER — Ambulatory Visit: Payer: BC Managed Care – PPO | Admitting: Endocrinology

## 2019-12-07 VITALS — BP 130/88 | HR 79 | Ht 75.0 in | Wt 264.0 lb

## 2019-12-07 DIAGNOSIS — Z794 Long term (current) use of insulin: Secondary | ICD-10-CM

## 2019-12-07 DIAGNOSIS — N183 Chronic kidney disease, stage 3 unspecified: Secondary | ICD-10-CM | POA: Diagnosis not present

## 2019-12-07 DIAGNOSIS — E1122 Type 2 diabetes mellitus with diabetic chronic kidney disease: Secondary | ICD-10-CM

## 2019-12-07 LAB — POCT GLYCOSYLATED HEMOGLOBIN (HGB A1C): Hemoglobin A1C: 7.9 % — AB (ref 4.0–5.6)

## 2019-12-07 MED ORDER — TRULICITY 0.75 MG/0.5ML ~~LOC~~ SOAJ: 0.7500 mg | SUBCUTANEOUS | 11 refills | Status: DC

## 2019-12-07 MED ORDER — LEVEMIR FLEXTOUCH 100 UNIT/ML ~~LOC~~ SOPN: 70.0000 [IU] | PEN_INJECTOR | SUBCUTANEOUS | 3 refills | Status: DC

## 2019-12-07 NOTE — Patient Instructions (Addendum)
I have sent a prescription to your pharmacy, to add "Trulicity," and: Please reduce the insulin to 70 units each morning  check your blood sugar twice a day.  vary the time of day when you check, between before the 3 meals, and at bedtime.  also check if you have symptoms of your blood sugar being too high or too low.  please keep a record of the readings and bring it to your next appointment here (or you can bring the meter itself).  You can write it on any piece of paper.  please call us sooner if your blood sugar goes below 70, or if you have a lot of readings over 200. Please come back for a follow-up appointment in 3 months.

## 2019-12-07 NOTE — Progress Notes (Signed)
Subjective:    Patient ID: Alexander [name], male    DOB: Feb 05, 1971, 49 y.o.   MRN: [id number]  HPI Pt returns for f/u of diabetes mellitus:  DM type: insulin-requiring type 2 Dx'ed: 4270 Complications: PN, CRI, and NPDR.   Therapy: insulin since 2013.   DKA: never.  Severe hypoglycemia: never.   Pancreatitis: never.  Other: he declines multiple daily injections.  He declines weight loss surgery.   Interval history: no cbg record, but states cbg's vary from 114-190.  There is no trend throughout the day.  pt states he feels well in general.  Pt says he seldom misses the insulin.   Past Medical History:  Diagnosis Date  . CHEST PAIN 08/28/2009  . DIABETES MELLITUS, TYPE II 03/20/2007  . HYPERCHOLESTEROLEMIA 07/21/2008  . HYPERTENSION 03/20/2007  . Hypertensive urgency 03/27/2015  . Left ventricular dysfunction 03/28/2015   EF 40-45%, no WMA, grade 2 diastolic dysfunction  . Renal disorder     Past Surgical History:  Procedure Laterality Date  . KNEE ARTHROSCOPY Right     Social History   Socioeconomic History  . Marital status: Married    Spouse name: Not on file  . Number of children: Not on file  . Years of education: Not on file  . Highest education level: Not on file  Occupational History  . Occupation: Training and development officer: [employer]  Tobacco Use  . Smoking status: Never Smoker  . Smokeless tobacco: Never Used  Substance and Sexual Activity  . Alcohol use: No  . Drug use: No  . Sexual activity: Not on file  Other Topics Concern  . Not on file  Social History Narrative  . Not on file   Social Determinants of Health   Financial Resource Strain:   . Difficulty of Paying Living Expenses:   Food Insecurity:   . Worried About Charity fundraiser in the Last Year:   . Arboriculturist in the Last Year:   Transportation Needs:   . Film/video editor (Medical):   Marland Kitchen Lack of Transportation (Non-Medical):   Physical Activity:   . Days of Exercise per Week:     . Minutes of Exercise per Session:   Stress:   . Feeling of Stress :   Social Connections:   . Frequency of Communication with Friends and Family:   . Frequency of Social Gatherings with Friends and Family:   . Attends Religious Services:   . Active Member of Clubs or Organizations:   . Attends Archivist Meetings:   Marland Kitchen Marital Status:   Intimate Partner Violence:   . Fear of Current or Ex-Partner:   . Emotionally Abused:   Marland Kitchen Physically Abused:   . Sexually Abused:     Current Outpatient Medications on File Prior to Visit  Medication Sig Dispense Refill  . acetaminophen (TYLENOL) 500 MG tablet Take 500 mg by mouth every 6 (six) hours as needed for headache (pain).    Marland Kitchen amLODipine (NORVASC) 10 MG tablet Take 1 tablet (10 mg total) by mouth daily. 30 tablet 0  . aspirin EC 81 MG EC tablet Take 1 tablet (81 mg total) by mouth daily.    Marland Kitchen atorvastatin (LIPITOR) 40 MG tablet Take 1 tablet (40 mg total) by mouth daily at 6 PM. 90 tablet 1  . carvedilol (COREG) 12.5 MG tablet TAKE 1 TABLET (12.5 MG TOTAL) BY MOUTH 2 (TWO) TIMES DAILY WITH A MEAL. 180 tablet 3  .  gabapentin (NEURONTIN) 300 MG capsule Take 300 mg by mouth 3 (three) times daily as needed (neuropathy).     Marland Kitchen glucose blood (FREESTYLE LITE) test strip 1 each by Other route 2 (two) times daily. And lancets 2/day 200 each 3  . hydrALAZINE (APRESOLINE) 100 MG tablet TAKE 1 TABLET BY MOUTH 3 TIMES A DAY 90 tablet 0  . Insulin Pen Needle (BD PEN NEEDLE NANO U/F) 32G X 4 MM MISC USE 1 PEN NEEDLE DAILY TO INJECT LEVEMIR 100 each 3  . Lancets (FREESTYLE) lancets Use to monitor glucose levels BID; E11.22 100 each 12  . lidocaine (LIDODERM) 5 % Place 1 patch onto the skin daily as needed. Remove & Discard patch within 12 hours or as directed by MD 10 patch 0  . methocarbamol (ROBAXIN) 500 MG tablet Take 1 tablet (500 mg total) by mouth every 8 (eight) hours as needed. (Patient taking differently: Take 500 mg by mouth every 8  (eight) hours as needed for muscle spasms. ) 60 tablet 1  . nitroGLYCERIN (NITROSTAT) 0.4 MG SL tablet Place 1 tablet (0.4 mg total) under the tongue every 5 (five) minutes x 3 doses as needed for chest pain. Don't take within 24 hrs of Cialis 25 tablet 0  . tadalafil (CIALIS) 20 MG tablet Take 1 tablet (20 mg total) by mouth daily as needed for erectile dysfunction. 10 tablet 11  . Vitamin D, Ergocalciferol, (DRISDOL) 50000 units CAPS capsule Take 50,000 Units by mouth every Monday.      No current facility-administered medications on file prior to visit.    No Known Allergies  Family History  Problem Relation Age of Onset  . Diabetes Mother   . Diabetes Father   . Hypertension Father   . Diabetes Maternal Grandfather   . Diabetes Paternal Grandfather   . Diabetes Brother   . Hypertension Brother     BP 130/88   Pulse 79   Ht 6\' 3"  (1.905 m)   Wt 264 lb (119.7 kg)   SpO2 98%   BMI 33.00 kg/m    Review of Systems He denies hypoglycemia    Objective:   Physical Exam VITAL SIGNS:  See vs page GENERAL: no distress Pulses: dorsalis pedis intact bilat.   MSK: no deformity of the feet CV: no leg edema Skin:  no ulcer on the feet.  normal color and temp on the feet. Neuro: sensation is intact to touch on the feet Ext: there is bilateral onychomycosis of the toenails   Lab Results  Component Value Date   HGBA1C 7.9 (A) 12/07/2019       Assessment & Plan:  Insulin-requiring type 2 DM: he would benefit from increased rx, if it can be done with a regimen that avoids or minimizes hypoglycemia.  Patient Instructions  I have sent a prescription to your pharmacy, to add "Trulicity," and: Please reduce the insulin to 70 units each morning  check your blood sugar twice a day.  vary the time of day when you check, between before the 3 meals, and at bedtime.  also check if you have symptoms of your blood sugar being too high or too low.  please keep a record of the readings and  bring it to your next appointment here (or you can bring the meter itself).  You can write it on any piece of paper.  please call us sooner if your blood sugar goes below 70, or if you have a lot of readings over 200. Please  come back for a follow-up appointment in 3 months.

## 2019-12-17 ENCOUNTER — Telehealth: Payer: Self-pay | Admitting: Internal Medicine

## 2019-12-17 NOTE — Telephone Encounter (Signed)
I attempted to contact patient on 12/17/19 to schedule follow up visit from patients recall list. The patient didn't answer and was unable to leave message, voicemail was full.

## 2020-01-28 ENCOUNTER — Other Ambulatory Visit: Payer: Self-pay | Admitting: Endocrinology

## 2020-02-21 ENCOUNTER — Other Ambulatory Visit: Payer: Self-pay | Admitting: Endocrinology

## 2020-03-08 ENCOUNTER — Ambulatory Visit (INDEPENDENT_AMBULATORY_CARE_PROVIDER_SITE_OTHER): Payer: BC Managed Care – PPO | Admitting: Endocrinology

## 2020-03-08 ENCOUNTER — Encounter: Payer: Self-pay | Admitting: Endocrinology

## 2020-03-08 VITALS — BP 154/88 | HR 88 | Ht 75.0 in

## 2020-03-08 DIAGNOSIS — Z794 Long term (current) use of insulin: Secondary | ICD-10-CM

## 2020-03-08 DIAGNOSIS — N183 Chronic kidney disease, stage 3 unspecified: Secondary | ICD-10-CM | POA: Diagnosis not present

## 2020-03-08 DIAGNOSIS — E1122 Type 2 diabetes mellitus with diabetic chronic kidney disease: Secondary | ICD-10-CM | POA: Diagnosis not present

## 2020-03-08 LAB — POCT GLYCOSYLATED HEMOGLOBIN (HGB A1C): Hemoglobin A1C: 6.6 % — AB (ref 4.0–5.6)

## 2020-03-08 MED ORDER — TRULICITY 1.5 MG/0.5ML ~~LOC~~ SOAJ
1.5000 mg | SUBCUTANEOUS | 3 refills | Status: DC
Start: 2020-03-08 — End: 2021-02-20

## 2020-03-08 MED ORDER — LEVEMIR FLEXTOUCH 100 UNIT/ML ~~LOC~~ SOPN: 50.0000 [IU] | PEN_INJECTOR | SUBCUTANEOUS | 11 refills | Status: DC

## 2020-03-08 NOTE — Patient Instructions (Signed)
I have sent 2 prescriptions to your pharmacy: to increase the trulicity, and decrease the insulin Your blood pressure is high today.  Please see your primary care provider soon, to have it rechecked check your blood sugar twice a day.  vary the time of day when you check, between before the 3 meals, and at bedtime.  also check if you have symptoms of your blood sugar being too high or too low.  please keep a record of the readings and bring it to your next appointment here (or you can bring the meter itself).  You can write it on any piece of paper.  please call us sooner if your blood sugar goes below 70, or if you have a lot of readings over 200. Please come back for a follow-up appointment in 2-3 months.

## 2020-03-08 NOTE — Progress Notes (Signed)
Subjective:    Patient ID: Alexander [name], male    DOB: [birth year], 49 y.o.   MRN: [id number]  HPI Pt returns for f/u of diabetes mellitus:  DM type: insulin-requiring type 2 Dx'ed: 4888 Complications: PN, CRI, and NPDR.   Therapy: insulin since 2013.   DKA: never.  Severe hypoglycemia: never.   Pancreatitis: never.  Other: he declines multiple daily injections.  He declines weight loss surgery.   Interval history: no cbg record, but states cbg's vary from 114-190.  There is no trend throughout the day.  pt states he feels well in general.  Pt says he seldom misses the insulin.   Past Medical History:  Diagnosis Date  . CHEST PAIN 08/28/2009  . DIABETES MELLITUS, TYPE II 03/20/2007  . HYPERCHOLESTEROLEMIA 07/21/2008  . HYPERTENSION 03/20/2007  . Hypertensive urgency 03/27/2015  . Left ventricular dysfunction 03/28/2015   EF 40-45%, no WMA, grade 2 diastolic dysfunction  . Renal disorder     Past Surgical History:  Procedure Laterality Date  . KNEE ARTHROSCOPY Right     Social History   Socioeconomic History  . Marital status: Married    Spouse name: Not on file  . Number of children: Not on file  . Years of education: Not on file  . Highest education level: Not on file  Occupational History  . Occupation: Training and development officer: [employer]  Tobacco Use  . Smoking status: Never Smoker  . Smokeless tobacco: Never Used  Substance and Sexual Activity  . Alcohol use: No  . Drug use: No  . Sexual activity: Not on file  Other Topics Concern  . Not on file  Social History Narrative  . Not on file   Social Determinants of Health   Financial Resource Strain:   . Difficulty of Paying Living Expenses: Not on file  Food Insecurity:   . Worried About Charity fundraiser in the Last Year: Not on file  . Ran Out of Food in the Last Year: Not on file  Transportation Needs:   . Lack of Transportation (Medical): Not on file  . Lack of Transportation (Non-Medical): Not on file    Physical Activity:   . Days of Exercise per Week: Not on file  . Minutes of Exercise per Session: Not on file  Stress:   . Feeling of Stress : Not on file  Social Connections:   . Frequency of Communication with Friends and Family: Not on file  . Frequency of Social Gatherings with Friends and Family: Not on file  . Attends Religious Services: Not on file  . Active Member of Clubs or Organizations: Not on file  . Attends Archivist Meetings: Not on file  . Marital Status: Not on file  Intimate Partner Violence:   . Fear of Current or Ex-Partner: Not on file  . Emotionally Abused: Not on file  . Physically Abused: Not on file  . Sexually Abused: Not on file    Current Outpatient Medications on File Prior to Visit  Medication Sig Dispense Refill  . acetaminophen (TYLENOL) 500 MG tablet Take 500 mg by mouth every 6 (six) hours as needed for headache (pain).    Marland Kitchen amLODipine (NORVASC) 10 MG tablet Take 1 tablet (10 mg total) by mouth daily. 30 tablet 0  . aspirin EC 81 MG EC tablet Take 1 tablet (81 mg total) by mouth daily.    Marland Kitchen atorvastatin (LIPITOR) 40 MG tablet Take 1 tablet (40 mg total)  by mouth daily at 6 PM. 90 tablet 1  . carvedilol (COREG) 12.5 MG tablet TAKE 1 TABLET (12.5 MG TOTAL) BY MOUTH 2 (TWO) TIMES DAILY WITH A MEAL. 180 tablet 3  . gabapentin (NEURONTIN) 300 MG capsule Take 300 mg by mouth 3 (three) times daily as needed (neuropathy).     Marland Kitchen glucose blood (FREESTYLE LITE) test strip 1 each by Other route 2 (two) times daily. And lancets 2/day 200 each 3  . hydrALAZINE (APRESOLINE) 100 MG tablet TAKE 1 TABLET BY MOUTH 3 TIMES A DAY 90 tablet 0  . Insulin Pen Needle (BD PEN NEEDLE NANO 2ND GEN) 32G X 4 MM MISC 1 each by Other route daily. E11.9 100 each 0  . Lancets (FREESTYLE) lancets Use to monitor glucose levels BID; E11.22 100 each 12  . lidocaine (LIDODERM) 5 % Place 1 patch onto the skin daily as needed. Remove & Discard patch within 12 hours or as directed  by MD 10 patch 0  . methocarbamol (ROBAXIN) 500 MG tablet Take 1 tablet (500 mg total) by mouth every 8 (eight) hours as needed. (Patient taking differently: Take 500 mg by mouth every 8 (eight) hours as needed for muscle spasms. ) 60 tablet 1  . nitroGLYCERIN (NITROSTAT) 0.4 MG SL tablet Place 1 tablet (0.4 mg total) under the tongue every 5 (five) minutes x 3 doses as needed for chest pain. Don't take within 24 hrs of Cialis 25 tablet 0  . tadalafil (CIALIS) 20 MG tablet Take 1 tablet (20 mg total) by mouth daily as needed for erectile dysfunction. 10 tablet 11  . Vitamin D, Ergocalciferol, (DRISDOL) 50000 units CAPS capsule Take 50,000 Units by mouth every Monday.      No current facility-administered medications on file prior to visit.    No Known Allergies  Family History  Problem Relation Age of Onset  . Diabetes Mother   . Diabetes Father   . Hypertension Father   . Diabetes Maternal Grandfather   . Diabetes Paternal Grandfather   . Diabetes Brother   . Hypertension Brother     BP (!) 154/88   Pulse 88   Ht 6\' 3"  (1.905 m)   SpO2 97%   BMI 33.00 kg/m    Review of Systems     Objective:   Physical Exam VITAL SIGNS:  See vs page GENERAL: no distress Pulses: dorsalis pedis intact bilat.   MSK: no deformity of the feet CV: no leg edema Skin:  no ulcer on the feet.  normal color and temp on the feet. Neuro: sensation is intact to touch on the feet  Lab Results  Component Value Date   HGBA1C 6.6 (A) 03/08/2020        Assessment & Plan:  Insulin-requiring type 2 DM, with CRI Obesity: in this setting, we should favor GLP over insulin. HTN: is noted today  Patient Instructions  I have sent 2 prescriptions to your pharmacy: to increase the trulicity, and decrease the insulin Your blood pressure is high today.  Please see your primary care provider soon, to have it rechecked check your blood sugar twice a day.  vary the time of day when you check, between before  the 3 meals, and at bedtime.  also check if you have symptoms of your blood sugar being too high or too low.  please keep a record of the readings and bring it to your next appointment here (or you can bring the meter itself).  You can write  it on any piece of paper.  please call us sooner if your blood sugar goes below 70, or if you have a lot of readings over 200. Please come back for a follow-up appointment in 2-3 months.

## 2020-03-22 ENCOUNTER — Other Ambulatory Visit: Payer: Self-pay

## 2020-03-22 ENCOUNTER — Telehealth: Payer: Self-pay | Admitting: Endocrinology

## 2020-03-22 DIAGNOSIS — Z794 Long term (current) use of insulin: Secondary | ICD-10-CM

## 2020-03-22 MED ORDER — LEVEMIR FLEXTOUCH 100 UNIT/ML ~~LOC~~ SOPN: 50.0000 [IU] | PEN_INJECTOR | SUBCUTANEOUS | 3 refills | Status: DC

## 2020-03-22 NOTE — Telephone Encounter (Signed)
Patient needs the levemir 50 called in instead of the levemir 100.  [id number] [address], [place] Phone:  [phone]  Fax:  [phone]

## 2020-03-22 NOTE — Telephone Encounter (Signed)
Outpatient Medication Detail   Disp Refills Start End   insulin detemir (LEVEMIR FLEXTOUCH) 100 UNIT/ML FlexPen 45 mL 3 03/22/2020    Sig - Route: Inject 50 Units into the skin every morning. - Subcutaneous   Sent to pharmacy as: insulin detemir (LEVEMIR FLEXTOUCH) 100 UNIT/ML FlexPen   E-Prescribing Status: Receipt confirmed by pharmacy (03/22/2020  1:36 PM EDT)

## 2020-03-30 ENCOUNTER — Other Ambulatory Visit: Payer: Self-pay | Admitting: Internal Medicine

## 2020-05-04 ENCOUNTER — Other Ambulatory Visit: Payer: Self-pay | Admitting: Endocrinology

## 2020-05-17 DIAGNOSIS — N183 Chronic kidney disease, stage 3 unspecified: Secondary | ICD-10-CM | POA: Diagnosis not present

## 2020-05-17 DIAGNOSIS — E1122 Type 2 diabetes mellitus with diabetic chronic kidney disease: Secondary | ICD-10-CM | POA: Diagnosis not present

## 2020-05-17 DIAGNOSIS — R801 Persistent proteinuria, unspecified: Secondary | ICD-10-CM | POA: Diagnosis not present

## 2020-05-18 DIAGNOSIS — E1122 Type 2 diabetes mellitus with diabetic chronic kidney disease: Secondary | ICD-10-CM | POA: Diagnosis not present

## 2020-05-18 DIAGNOSIS — E889 Metabolic disorder, unspecified: Secondary | ICD-10-CM | POA: Diagnosis not present

## 2020-05-18 DIAGNOSIS — E559 Vitamin D deficiency, unspecified: Secondary | ICD-10-CM | POA: Diagnosis not present

## 2020-05-18 DIAGNOSIS — I129 Hypertensive chronic kidney disease with stage 1 through stage 4 chronic kidney disease, or unspecified chronic kidney disease: Secondary | ICD-10-CM | POA: Diagnosis not present

## 2020-05-22 ENCOUNTER — Other Ambulatory Visit: Payer: Self-pay | Admitting: Endocrinology

## 2020-06-06 ENCOUNTER — Ambulatory Visit: Payer: BC Managed Care – PPO | Admitting: Endocrinology

## 2020-06-15 ENCOUNTER — Ambulatory Visit: Payer: BC Managed Care – PPO | Admitting: Internal Medicine

## 2020-06-15 ENCOUNTER — Other Ambulatory Visit: Payer: Self-pay

## 2020-06-15 ENCOUNTER — Encounter: Payer: Self-pay | Admitting: Internal Medicine

## 2020-06-15 VITALS — BP 130/82 | HR 83 | Ht 75.0 in | Wt 259.0 lb

## 2020-06-15 DIAGNOSIS — N183 Chronic kidney disease, stage 3 unspecified: Secondary | ICD-10-CM

## 2020-06-15 DIAGNOSIS — E785 Hyperlipidemia, unspecified: Secondary | ICD-10-CM

## 2020-06-15 DIAGNOSIS — I10 Essential (primary) hypertension: Secondary | ICD-10-CM | POA: Diagnosis not present

## 2020-06-15 NOTE — Progress Notes (Signed)
Cardiology Office Note   Date:  06/15/2020   ID:  Alexander [name], DOB [birth year], MRN [id number]  PCP:  Hoyt Koch, MD  Cardiologist:  Dr Charma Igo, MD   No chief complaint on file. No complaints  History of Present Illness: Alexander [name] is a 49 y.o. male with a history of DM, HL, HTN, d/c 09/22 after hospitalization for atyp CP, hypertensive urgency, EF 40-45% by echo w/ gr 2 DD, MV w/ small defect EF 33%. ARF on CKD. He was recently seen by [name] and medications were adjusted. It was noted that his creatinine had risen and he was taken off of ACE inhibitor and a increase of his carvedilol was made. His blood pressure today is very well controlled in the office at 122/76, however I reviewed a blood pressure log from home which indicated varying blood pressures between 485I and 627O systolic as well as diastolics between 90 and 350 at times. This suggests that it may not be as well controlled as we suspect. Is not clear what would be causing his labile blood pressures. He is on 3 times a day hydralazine which he takes without fail. There could be a waning effect of the medication. He does have some chronic kidney disease and another possibility could be some degree of fibromuscular dysplasia or renal artery stenosis. I do not believe this is been evaluated. Of note he had a repeat echocardiogram pleased to say that his EF is now come back to 55-60%. He did have a low risk Myoview in the hospital, but his ejection fraction was reduced. Currently he is asymptomatic and denies chest pain or worsening shortness of breath.  07/12/2016  Alexander [name] returns today for follow-up. He reports feeling well and generally feels like his blood pressure is somewhat elevated although recently saw a nephrologist in [facility] and he felt like his blood pressure was well controlled. Today's blood pressure is 157/82, however I rechecked the blood pressure and it came back at  120/76. We then checked his blood pressure with his home cuff which was 135/76. There may be a slight discrepancy with a favor to higher blood pressure of his home cuff. In general I feel that his blood pressure control is very good and may need to be tweaked somewhat.  01/09/2017  Alexander [name] was seen today in follow-up. Overall he feels well. He denies a chest pain or worsening shortness of breath. He has had interim improvement in LVEF back to normal. His blood pressure has been better controlled. Today's 135/78. He's working with his nephrologist. Creatinine is recently 2.2. He has been complaining of some heaviness in his leg when he walks. Is not necessarily crampy pain or burning. It comes and goes with exertion without. He does have a history of some peripheral neuropathy and was supposed to be on gabapentin but has not taken it due to some fear of the medication. He is on a atorvastatin. His most recent lipid profile shows an LDL-C of 81, but previously he was well controlled at 61.  04/16/2018  Alexander [name] returns today for follow-up.  He is doing well.  He denies chest pain or any shortness of breath.  EKG shows sinus rhythm with nonspecific T wave changes.  Blood pressure is 134/80.  07/14/2019  Alexander [name] returns today for follow-up.  Overall he is doing well though blood pressure was noted to be elevated today.  Recent labs showed LDL 81  hemoglobin A1c of 6.8 and creatinine had increased from 2.3-2.74.  He is working with a nephrologist Dr. Loanne Drilling on his diabetes.  EKG shows sinus rhythm with some nonspecific T wave changes.  06/15/2020  Alexander [name] is seen today in follow-up.  He continues to do well.  Blood pressure is now better controlled.  He recently saw his nephrologist who noted that he also had good blood pressure control and that his renal function was fairly stable.  He denies any worsening shortness of breath or chest pain.  He had started on Trulicity after some recent  weight gain and increase in A1c however that has improved and he seems to be doing better.  He has not had recent lipid testing since January of this year.  Past Medical History:  Diagnosis Date  . CHEST PAIN 08/28/2009  . DIABETES MELLITUS, TYPE II 03/20/2007  . HYPERCHOLESTEROLEMIA 07/21/2008  . HYPERTENSION 03/20/2007  . Hypertensive urgency 03/27/2015  . Left ventricular dysfunction 03/28/2015   EF 40-45%, no WMA, grade 2 diastolic dysfunction  . Renal disorder     Past Surgical History:  Procedure Laterality Date  . KNEE ARTHROSCOPY Right     Current Outpatient Prescriptions  Medication Sig Dispense Refill  . aspirin EC 81 MG EC tablet Take 1 tablet (81 mg total) by mouth daily.    Marland Kitchen atorvastatin (LIPITOR) 40 MG tablet Take 1 tablet (40 mg total) by mouth daily at 6 PM. 90 tablet 3  . carvedilol (COREG) 6.25 MG tablet Take 1 tablet (6.25 mg total) by mouth 2 (two) times daily with a meal. 60 tablet 5  . hydrALAZINE (APRESOLINE) 100 MG tablet Take 1 tablet (100 mg total) by mouth every 8 (eight) hours. 90 tablet 5  . insulin detemir (LEVEMIR) 100 UNIT/ML injection Inject 2 mLs (200 Units total) into the skin every morning. (Patient taking differently: Inject 200 Units into the skin every morning. Patient states he is taking 200 units daily per patient) 70 mL 11  . lisinopril (PRINIVIL,ZESTRIL) 20 MG tablet Take 1 tablet (20 mg total) by mouth daily. 90 tablet 3  . nitroGLYCERIN (NITROSTAT) 0.4 MG SL tablet Place 1 tablet (0.4 mg total) under the tongue every 5 (five) minutes x 3 doses as needed for chest pain. Don't take within 24 hrs of Cialis 25 tablet 3  . tadalafil (CIALIS) 20 MG tablet Take 1 tablet (20 mg total) by mouth daily as needed for erectile dysfunction. 10 tablet 11   No current facility-administered medications for this visit.    Allergies:   Patient has no known allergies.    Social History:  The patient  reports that he has never smoked. He has never used  smokeless tobacco. He reports that he does not drink alcohol and does not use drugs.   Family History:  The patient's family history includes Diabetes in his brother, father, maternal grandfather, mother, and paternal grandfather; Hypertension in his brother and father.    ROS:  Please see the history of present illness. All other systems are reviewed and negative.    PHYSICAL EXAM: VS:  BP 130/82   Pulse 83   Ht 6\' 3"  (1.905 m)   Wt 259 lb (117.5 kg)   BMI 32.37 kg/m  , BMI Body mass index is 32.37 kg/m. General appearance: alert and no distress Neck: no carotid bruit, no JVD and thyroid not enlarged, symmetric, no tenderness/mass/nodules Lungs: clear to auscultation bilaterally Heart: regular rate and rhythm, S1, S2 normal, no murmur,  click, rub or gallop Abdomen: soft, non-tender; bowel sounds normal; no masses,  no organomegaly Extremities: extremities normal, atraumatic, no cyanosis or edema Pulses: 2+ and symmetric Skin: Skin color, texture, turgor normal. No rashes or lesions Neurologic: Grossly normal Psych: Pleasant  EKG:   S normal sinus rhythm at 83, voltage criteria for LVH with some lateral T wave changes-personally reviewed  Recent Labs: No results found for requested labs within last 8760 hours.    Lipid Panel    Component Value Date/Time   CHOL 152 07/27/2019 0823   TRIG 91 07/27/2019 0823   HDL 47 07/27/2019 0823   CHOLHDL 3.2 07/27/2019 0823   CHOLHDL 3 10/11/2016 1545   VLDL 16.6 10/11/2016 1545   LDLCALC 88 07/27/2019 0823   LDLDIRECT 164.0 07/15/2014 1703     Wt Readings from Last 3 Encounters:  06/15/20 259 lb (117.5 kg)  12/07/19 264 lb (119.7 kg)  09/07/19 262 lb (118.8 kg)     ASSESSMENT:  1. Type 2 diabetes on insulin 2. Labile hypertension 3. Recent systolic congestive heart failure with improvement in LVEF to 55-60% 4. Hypertensive heart disease with heart failure 5. Hypertensive nephropathy - CKD3  PLAN: 1. Alexander [name] has  had better blood pressure control recently.  He has managed to lose some weight with Trulicity which is helped with his diabetes.  He is renal function seems stable and is followed by nephrologist for this.  He denies any recurrent heart failure symptoms or chest pain.  We will plan a repeat lipid profile but no changes in his medicines at this time.  Follow-up annually or sooner as necessary.  [name], MD, [facility], [place] Director of the [facility]  [facility] Diplomate of the American Board of Clinical Lipidology Attending Cardiologist  Direct Dial: [phone]  Fax: [phone]  Website:  [email or web address]   [name], MD  06/15/2020 4:13 PM

## 2020-06-15 NOTE — Patient Instructions (Signed)
Medication Instructions:  Your physician recommends that you continue on your current medications as directed. Please refer to the Current Medication list given to you today.  *If you need a refill on your cardiac medications before your next appointment, please call your pharmacy*  Lab Work: Your physician recommends that you return for lab work to check cholesterol when you have been fasting.   Follow-Up: At [facility], you and your health needs are our priority.  As part of our continuing mission to provide you with exceptional heart care, we have created designated Provider Care Teams.  These Care Teams include your primary Cardiologist (physician) and Advanced Practice Providers (APPs -  Physician Assistants and Nurse Practitioners) who all work together to provide you with the care you need, when you need it.  We recommend signing up for the patient portal called "MyChart".  Sign up information is provided on this After Visit Summary.  MyChart is used to connect with patients for Virtual Visits (Telemedicine).  Patients are able to view lab/test results, encounter notes, upcoming appointments, etc.  Non-urgent messages can be sent to your provider as well.   To learn more about what you can do with MyChart, go to [email or web address].    Your next appointment:   12 month(s)  The format for your next appointment:   In Person  Provider:   You may see [name], MD or one of the following Advanced Practice Providers on your designated Care Team:    Almyra Deforest, PA-C  Fabian Sharp, PA-C or   Roby Lofts, [place]    Other Instructions

## 2020-06-22 ENCOUNTER — Telehealth: Payer: Self-pay | Admitting: Internal Medicine

## 2020-06-22 NOTE — Telephone Encounter (Signed)
Pt information sent to MAB infusion in a secure chat.

## 2020-06-22 NOTE — Telephone Encounter (Signed)
Patient tested positive for covid on Monday. Received a call from [facility] department that he may be eligible for MAB infusion.  Please enter orders for the patient

## 2020-06-22 NOTE — Telephone Encounter (Signed)
Please route to the pool handling MAB at [facility] this so they can reach out to patient.

## 2020-06-23 ENCOUNTER — Other Ambulatory Visit (HOSPITAL_COMMUNITY): Payer: Self-pay | Admitting: Family

## 2020-06-23 DIAGNOSIS — U071 COVID-19: Secondary | ICD-10-CM

## 2020-06-23 NOTE — Progress Notes (Signed)
I connected by phone with Alexander [name] on 06/23/2020 at 7:08 PM to discuss the potential use of a new treatment for mild to moderate COVID-19 viral infection in non-hospitalized patients.  This patient is a 49 y.o. male that meets the FDA criteria for Emergency Use Authorization of COVID monoclonal antibody casirivimab/imdevimab, bamlanivimab/etesevimab, or sotrovimab.  Has a (+) direct SARS-CoV-2 viral test result  Has mild or moderate COVID-19   Is NOT hospitalized due to COVID-19  Is within 10 days of symptom onset  Has at least one of the high risk factor(s) for progression to severe COVID-19 and/or hospitalization as defined in EUA.  Specific high risk criteria : BMI > 25, Chronic Kidney Disease (CKD), Diabetes and Cardiovascular disease or hypertension   Symptoms of congestion, fever, H/A began 06/15/20.   I have spoken and communicated the following to the patient or parent/caregiver regarding COVID monoclonal antibody treatment:  1. FDA has authorized the emergency use for the treatment of mild to moderate COVID-19 in adults and pediatric patients with positive results of direct SARS-CoV-2 viral testing who are 67 years of age and older weighing at least 40 kg, and who are at high risk for progressing to severe COVID-19 and/or hospitalization.  2. The significant known and potential risks and benefits of COVID monoclonal antibody, and the extent to which such potential risks and benefits are unknown.  3. Information on available alternative treatments and the risks and benefits of those alternatives, including clinical trials.  4. Patients treated with COVID monoclonal antibody should continue to self-isolate and use infection control measures (e.g., wear mask, isolate, social distance, avoid sharing personal items, clean and disinfect high touch surfaces, and frequent handwashing) according to CDC guidelines.   5. The patient or parent/caregiver has the option to accept or  refuse COVID monoclonal antibody treatment.  After reviewing this information with the patient, the patient has agreed to receive one of the available covid 19 monoclonal antibodies and will be provided an appropriate fact sheet prior to infusion. Alexander Gowda, NP 06/23/2020 7:08 PM

## 2020-06-24 ENCOUNTER — Ambulatory Visit (HOSPITAL_COMMUNITY)
Admission: RE | Admit: 2020-06-24 | Discharge: 2020-06-24 | Disposition: A | Discharge status: Home / Self Care | Payer: BC Managed Care – PPO | Source: Ambulatory Visit | Attending: Pulmonary Disease | Admitting: Pulmonary Disease

## 2020-06-24 ENCOUNTER — Other Ambulatory Visit (HOSPITAL_COMMUNITY): Payer: Self-pay

## 2020-06-24 DIAGNOSIS — U071 COVID-19: Secondary | ICD-10-CM | POA: Diagnosis not present

## 2020-06-24 MED ORDER — FAMOTIDINE IN NACL 20-0.9 MG/50ML-% IV SOLN: 20.0000 mg | Freq: Once | INTRAVENOUS | Status: DC | PRN

## 2020-06-24 MED ORDER — SODIUM CHLORIDE 0.9 % IV SOLN: INTRAVENOUS | Status: DC | PRN

## 2020-06-24 MED ORDER — DIPHENHYDRAMINE HCL 50 MG/ML IJ SOLN: 50.0000 mg | Freq: Once | INTRAMUSCULAR | Status: DC | PRN

## 2020-06-24 MED ORDER — ALBUTEROL SULFATE HFA 108 (90 BASE) MCG/ACT IN AERS: 2.0000 | INHALATION_SPRAY | Freq: Once | RESPIRATORY_TRACT | Status: DC | PRN

## 2020-06-24 MED ORDER — EPINEPHRINE 0.3 MG/0.3ML IJ SOAJ: 0.3000 mg | Freq: Once | INTRAMUSCULAR | Status: DC | PRN

## 2020-06-24 MED ORDER — SODIUM CHLORIDE 0.9 % IV SOLN: Freq: Once | INTRAVENOUS | Status: AC

## 2020-06-24 MED ORDER — METHYLPREDNISOLONE SODIUM SUCC 125 MG IJ SOLR: 125.0000 mg | Freq: Once | INTRAMUSCULAR | Status: DC | PRN

## 2020-06-24 NOTE — Progress Notes (Signed)
  Diagnosis: COVID-19  Physician: Dr. Wright  Procedure: Cov[facility] Infusion Clinic Med: bamlanivimab\etesevimab infusion - Provided patient with bamlanimivab\etesevimab fact sheet for patients, parents and caregivers prior to infusion.  Complications: No immediate complications noted.  Discharge: Discharged home   Jhoel Stieg Richards 06/24/2020   

## 2020-06-24 NOTE — Progress Notes (Signed)
Patient reviewed Fact Sheet for Patients, Parents, and Caregivers for Emergency Use Authorization (EUA) of bamlanivimab and etesevimab for the Treatment of Coronavirus. Patient also reviewed and is agreeable to the estimated cost of treatment. Patient is agreeable to proceed.   

## 2020-06-24 NOTE — Discharge Instructions (Signed)
10 Things You Can Do to Manage Your COVID-19 Symptoms at Home If you have possible or confirmed COVID-19: 1. Stay home from work and school. And stay away from other public places. If you must go out, avoid using any kind of public transportation, ridesharing, or taxis. 2. Monitor your symptoms carefully. If your symptoms get worse, call your healthcare provider immediately. 3. Get rest and stay hydrated. 4. If you have a medical appointment, call the healthcare provider ahead of time and tell them that you have or may have COVID-19. 5. For medical emergencies, call 911 and notify the dispatch personnel that you have or may have COVID-19. 6. Cover your cough and sneezes with a tissue or use the inside of your elbow. 7. Wash your hands often with soap and water for at least 20 seconds or clean your hands with an alcohol-based hand sanitizer that contains at least 60% alcohol. 8. As much as possible, stay in a specific room and away from other people in your home. Also, you should use a separate bathroom, if available. If you need to be around other people in or outside of the home, wear a mask. 9. Avoid sharing personal items with other people in your household, like dishes, towels, and bedding. 10. Clean all surfaces that are touched often, like counters, tabletops, and doorknobs. Use household cleaning sprays or wipes according to the label instructions. cdc.gov/coronavirus 01/06/2019 This information is not intended to replace advice given to you by your health care provider. Make sure you discuss any questions you have with your health care provider. Document Revised: 06/10/2019 Document Reviewed: 06/10/2019 Elsevier Patient Education  2020 Elsevier Inc. What types of side effects do monoclonal antibody drugs cause?  Common side effects  In general, the more common side effects caused by monoclonal antibody drugs include: . Allergic reactions, such as hives or itching . Flu-like signs and  symptoms, including chills, fatigue, fever, and muscle aches and pains . Nausea, vomiting . Diarrhea . Skin rashes . Low blood pressure   The CDC is recommending patients who receive monoclonal antibody treatments wait at least 90 days before being vaccinated.  Currently, there are no data on the safety and efficacy of mRNA COVID-19 vaccines in persons who received monoclonal antibodies or convalescent plasma as part of COVID-19 treatment. Based on the estimated half-life of such therapies as well as evidence suggesting that reinfection is uncommon in the 90 days after initial infection, vaccination should be deferred for at least 90 days, as a precautionary measure until additional information becomes available, to avoid interference of the antibody treatment with vaccine-induced immune responses. If you have any questions or concerns after the infusion please call the Advanced Practice Provider on call at [phone]. This number is ONLY intended for your use regarding questions or concerns about the infusion post-treatment side-effects.  Please do not provide this number to others for use. For return to work notes please contact your primary care provider.   If someone you know is interested in receiving treatment please have them call the COVID hotline at [phone].   

## 2020-10-07 ENCOUNTER — Other Ambulatory Visit: Payer: Self-pay | Admitting: Internal Medicine

## 2020-10-26 ENCOUNTER — Other Ambulatory Visit: Payer: Self-pay | Admitting: Endocrinology

## 2020-12-17 ENCOUNTER — Other Ambulatory Visit: Payer: Self-pay | Admitting: Endocrinology

## 2020-12-17 DIAGNOSIS — Z794 Long term (current) use of insulin: Secondary | ICD-10-CM

## 2021-01-21 ENCOUNTER — Other Ambulatory Visit: Payer: Self-pay | Admitting: Endocrinology

## 2021-01-21 DIAGNOSIS — Z794 Long term (current) use of insulin: Secondary | ICD-10-CM

## 2021-02-15 ENCOUNTER — Other Ambulatory Visit: Payer: Self-pay | Admitting: Endocrinology

## 2021-02-15 DIAGNOSIS — Z794 Long term (current) use of insulin: Secondary | ICD-10-CM

## 2021-02-15 DIAGNOSIS — E1122 Type 2 diabetes mellitus with diabetic chronic kidney disease: Secondary | ICD-10-CM

## 2021-02-15 DIAGNOSIS — N183 Chronic kidney disease, stage 3 unspecified: Secondary | ICD-10-CM

## 2021-02-20 ENCOUNTER — Ambulatory Visit: Payer: 59 | Admitting: Endocrinology

## 2021-02-20 ENCOUNTER — Other Ambulatory Visit: Payer: Self-pay

## 2021-02-20 VITALS — BP 160/90 | HR 84 | Ht 75.0 in | Wt 257.6 lb

## 2021-02-20 DIAGNOSIS — E1122 Type 2 diabetes mellitus with diabetic chronic kidney disease: Secondary | ICD-10-CM | POA: Diagnosis not present

## 2021-02-20 DIAGNOSIS — Z794 Long term (current) use of insulin: Secondary | ICD-10-CM | POA: Diagnosis not present

## 2021-02-20 DIAGNOSIS — N183 Chronic kidney disease, stage 3 unspecified: Secondary | ICD-10-CM | POA: Diagnosis not present

## 2021-02-20 LAB — POCT GLYCOSYLATED HEMOGLOBIN (HGB A1C): Hemoglobin A1C: 7.2 % — AB (ref 4.0–5.6)

## 2021-02-20 MED ORDER — TRULICITY 3 MG/0.5ML ~~LOC~~ SOAJ: 3.0000 mg | SUBCUTANEOUS | 3 refills | Status: DC

## 2021-02-20 MED ORDER — LEVEMIR FLEXTOUCH 100 UNIT/ML ~~LOC~~ SOPN: 35.0000 [IU] | PEN_INJECTOR | SUBCUTANEOUS | 3 refills | Status: DC

## 2021-02-20 NOTE — Patient Instructions (Addendum)
I have sent 2 prescriptions to your pharmacy: to increase the trulicity, and decrease the insulin again.  Your blood pressure is high today.  Please see your primary care provider soon, to have it rechecked Some specialists say that taking a high amount of folic acid (4-5 mg per day) helps the neuropathy.   check your blood sugar twice a day.  vary the time of day when you check, between before the 3 meals, and at bedtime.  also check if you have symptoms of your blood sugar being too high or too low.  please keep a record of the readings and bring it to your next appointment here (or you can bring the meter itself).  You can write it on any piece of paper.  please call us sooner if your blood sugar goes below 70, or if you have a lot of readings over 200. Please come back for a follow-up appointment in 2-3 months.

## 2021-02-20 NOTE — Progress Notes (Signed)
Subjective:    Patient ID: Alexander [name], male    DOB: [birth year], 49 y.o.   MRN: [id number]  HPI Pt returns for f/u of diabetes mellitus:  DM type: insulin-requiring type 2.   Dx'ed: 0240 Complications: PN, CRI, and NPDR.   Therapy: insulin since 9735, and Trulicity  DKA: never.  Severe hypoglycemia: never.   Pancreatitis: never.  Other: he declines multiple daily injections.  He declines weight loss surgery.   Interval history: no cbg record, but states cbg's vary from 105-180.  There is no trend throughout the day.  pt states he feels well in general.  Pt says he seldom misses the insulin.   Past Medical History:  Diagnosis Date   CHEST PAIN 08/28/2009   DIABETES MELLITUS, TYPE II 03/20/2007   HYPERCHOLESTEROLEMIA 07/21/2008   HYPERTENSION 03/20/2007   Hypertensive urgency 03/27/2015   Left ventricular dysfunction 03/28/2015   EF 40-45%, no WMA, grade 2 diastolic dysfunction   Renal disorder     Past Surgical History:  Procedure Laterality Date   KNEE ARTHROSCOPY Right     Social History   Socioeconomic History   Marital status: Married    Spouse name: Not on file   Number of children: Not on file   Years of education: Not on file   Highest education level: Not on file  Occupational History   Occupation: Training and development officer: [employer]  Tobacco Use   Smoking status: Never   Smokeless tobacco: Never  Substance and Sexual Activity   Alcohol use: No   Drug use: No   Sexual activity: Not on file  Other Topics Concern   Not on file  Social History Narrative   Not on file   Social Determinants of Health   Financial Resource Strain: Not on file  Food Insecurity: Not on file  Transportation Needs: Not on file  Physical Activity: Not on file  Stress: Not on file  Social Connections: Not on file  Intimate Partner Violence: Not on file    Current Outpatient Medications on File Prior to Visit  Medication Sig Dispense Refill   acetaminophen (TYLENOL) 500 MG  tablet Take 500 mg by mouth every 6 (six) hours as needed for headache (pain).     aspirin EC 81 MG EC tablet Take 1 tablet (81 mg total) by mouth daily.     atorvastatin (LIPITOR) 40 MG tablet TAKE 1 TABLET (40 MG TOTAL) BY MOUTH DAILY AT 6 PM. 90 tablet 2   BD PEN NEEDLE NANO 2ND GEN 32G X 4 MM MISC USE DAILY AS DIRECTED 100 each 3   carvedilol (COREG) 12.5 MG tablet TAKE 1 TABLET (12.5 MG TOTAL) BY MOUTH 2 (TWO) TIMES DAILY WITH A MEAL. 180 tablet 2   furosemide (LASIX) 20 MG tablet TAKE 1 TABLET (20 MG TOTAL) BY MOUTH DAILY AS NEEDED (FOR SHORTNESS OF BREATH AND SWELLING.).     glucose blood (FREESTYLE LITE) test strip 1 each by Other route 2 (two) times daily. And lancets 2/day 200 each 3   hydrALAZINE (APRESOLINE) 50 MG tablet Take 50 mg by mouth 2 (two) times daily.     Lancets (FREESTYLE) lancets Use to monitor glucose levels BID; E11.22 100 each 12   lidocaine (LIDODERM) 5 % Place 1 patch onto the skin daily as needed. Remove & Discard patch within 12 hours or as directed by MD 10 patch 0   nitroGLYCERIN (NITROSTAT) 0.4 MG SL tablet Place 1 tablet (0.4 mg total) under the  tongue every 5 (five) minutes x 3 doses as needed for chest pain. Don't take within 24 hrs of Cialis 25 tablet 0   Vitamin D, Ergocalciferol, (DRISDOL) 50000 units CAPS capsule Take 50,000 Units by mouth every Monday.      No current facility-administered medications on file prior to visit.    No Known Allergies  Family History  Problem Relation Age of Onset   Diabetes Mother    Diabetes Father    Hypertension Father    Diabetes Maternal Grandfather    Diabetes Paternal Grandfather    Diabetes Brother    Hypertension Brother     BP (!) 160/90 (BP Location: Right Arm, Patient Position: Sitting, Cuff Size: Large)   Pulse 84   Ht 6\' 3"  (1.905 m)   Wt 257 lb 9.6 oz (116.8 kg)   SpO2 96%   BMI 32.20 kg/m    Review of Systems He denies hypoglycemia/n/v    Objective:   Physical Exam VITAL SIGNS:  See vs  page GENERAL: no distress Pulses: dorsalis pedis intact bilat.   MSK: no deformity of the feet CV: no leg edema Skin:  no ulcer on the feet.  normal color and temp on the feet. Neuro: sensation is intact to touch on the feet, but decreased from normal. Ext: there is bilateral onychomycosis of the toenails.    A1c=7.2%     Assessment & Plan:  Insulin-requiring type 2 DM: uncontrolled.  Plan will be to emphasize GLP rx over insulin.   PN: uncontrolled  Patient Instructions  I have sent 2 prescriptions to your pharmacy: to increase the trulicity, and decrease the insulin again.  Your blood pressure is high today.  Please see your primary care provider soon, to have it rechecked Some specialists say that taking a high amount of folic acid (4-5 mg per day) helps the neuropathy.   check your blood sugar twice a day.  vary the time of day when you check, between before the 3 meals, and at bedtime.  also check if you have symptoms of your blood sugar being too high or too low.  please keep a record of the readings and bring it to your next appointment here (or you can bring the meter itself).  You can write it on any piece of paper.  please call us sooner if your blood sugar goes below 70, or if you have a lot of readings over 200. Please come back for a follow-up appointment in 2-3 months.

## 2021-02-22 ENCOUNTER — Other Ambulatory Visit: Payer: Self-pay | Admitting: Endocrinology

## 2021-02-26 ENCOUNTER — Other Ambulatory Visit: Payer: Self-pay

## 2021-02-26 DIAGNOSIS — Z794 Long term (current) use of insulin: Secondary | ICD-10-CM

## 2021-02-26 MED ORDER — TRULICITY 3 MG/0.5ML ~~LOC~~ SOAJ: 3.0000 mg | SUBCUTANEOUS | 3 refills | Status: DC

## 2021-03-07 ENCOUNTER — Other Ambulatory Visit: Payer: Self-pay | Admitting: Endocrinology

## 2021-03-15 ENCOUNTER — Encounter: Payer: Self-pay | Admitting: Internal Medicine

## 2021-03-22 ENCOUNTER — Other Ambulatory Visit: Payer: Self-pay | Admitting: Endocrinology

## 2021-03-22 NOTE — Telephone Encounter (Signed)
Medication requested was filled by a historical provider  Would you like to fill medication Last filled 12/26/2020 for 90 day

## 2021-05-10 DIAGNOSIS — H6982 Other specified disorders of Eustachian tube, left ear: Secondary | ICD-10-CM | POA: Insufficient documentation

## 2021-05-10 DIAGNOSIS — H6992 Unspecified Eustachian tube disorder, left ear: Secondary | ICD-10-CM | POA: Insufficient documentation

## 2021-05-10 DIAGNOSIS — H9193 Unspecified hearing loss, bilateral: Secondary | ICD-10-CM | POA: Insufficient documentation

## 2021-06-06 ENCOUNTER — Other Ambulatory Visit: Payer: Self-pay

## 2021-06-06 ENCOUNTER — Ambulatory Visit: Payer: 59 | Admitting: Endocrinology

## 2021-06-06 VITALS — BP 130/60 | HR 78 | Ht 75.0 in | Wt 255.8 lb

## 2021-06-06 DIAGNOSIS — N183 Chronic kidney disease, stage 3 unspecified: Secondary | ICD-10-CM | POA: Diagnosis not present

## 2021-06-06 DIAGNOSIS — E1122 Type 2 diabetes mellitus with diabetic chronic kidney disease: Secondary | ICD-10-CM

## 2021-06-06 DIAGNOSIS — Z794 Long term (current) use of insulin: Secondary | ICD-10-CM

## 2021-06-06 LAB — POCT GLYCOSYLATED HEMOGLOBIN (HGB A1C): Hemoglobin A1C: 7.2 % — AB (ref 4.0–5.6)

## 2021-06-06 MED ORDER — TRULICITY 4.5 MG/0.5ML ~~LOC~~ SOAJ: 4.5000 mg | SUBCUTANEOUS | 3 refills | Status: DC

## 2021-06-06 MED ORDER — LEVEMIR FLEXTOUCH 100 UNIT/ML ~~LOC~~ SOPN
25.0000 [IU] | PEN_INJECTOR | SUBCUTANEOUS | 3 refills | Status: DC
Start: 2021-06-06 — End: 2021-09-05

## 2021-06-06 NOTE — Patient Instructions (Addendum)
I have sent 2 prescriptions to your pharmacy: to increase the Trulicity, and decrease the insulin again.  check your blood sugar twice a day.  vary the time of day when you check, between before the 3 meals, and at bedtime.  also check if you have symptoms of your blood sugar being too high or too low.  please keep a record of the readings and bring it to your next appointment here (or you can bring the meter itself).  You can write it on any piece of paper.  please call us sooner if your blood sugar goes below 70, or if you have a lot of readings over 200.   Please come back for a follow-up appointment in 2-3 months.

## 2021-06-06 NOTE — Progress Notes (Signed)
Subjective:    Patient ID: Alexander [name], male    DOB: 1971/02/17, 50 y.o.   MRN: [id number]  HPI Pt returns for f/u of diabetes mellitus:  DM type: insulin-requiring type 2.   Dx'ed: 1884 Complications: PN, CRI, and NPDR.   Therapy: insulin since 1660, and Trulicity  DKA: never.  Severe hypoglycemia: never.   Pancreatitis: never.  Other: he declines multiple daily injections.  He declines weight loss surgery.   Interval history: no cbg record, but states cbg's vary from 120-150.  There is no trend throughout the day.  pt states he feels well in general.   Past Medical History:  Diagnosis Date   CHEST PAIN 08/28/2009   DIABETES MELLITUS, TYPE II 03/20/2007   HYPERCHOLESTEROLEMIA 07/21/2008   HYPERTENSION 03/20/2007   Hypertensive urgency 03/27/2015   Left ventricular dysfunction 03/28/2015   EF 40-45%, no WMA, grade 2 diastolic dysfunction   Renal disorder     Past Surgical History:  Procedure Laterality Date   KNEE ARTHROSCOPY Right     Social History   Socioeconomic History   Marital status: Married    Spouse name: Not on file   Number of children: Not on file   Years of education: Not on file   Highest education level: Not on file  Occupational History   Occupation: Training and development officer: [employer]  Tobacco Use   Smoking status: Never   Smokeless tobacco: Never  Substance and Sexual Activity   Alcohol use: No   Drug use: No   Sexual activity: Not on file  Other Topics Concern   Not on file  Social History Narrative   Not on file   Social Determinants of Health   Financial Resource Strain: Not on file  Food Insecurity: Not on file  Transportation Needs: Not on file  Physical Activity: Not on file  Stress: Not on file  Social Connections: Not on file  Intimate Partner Violence: Not on file    Current Outpatient Medications on File Prior to Visit  Medication Sig Dispense Refill   acetaminophen (TYLENOL) 500 MG tablet Take 500 mg by mouth every 6 (six)  hours as needed for headache (pain).     aspirin EC 81 MG EC tablet Take 1 tablet (81 mg total) by mouth daily.     atorvastatin (LIPITOR) 40 MG tablet TAKE 1 TABLET (40 MG TOTAL) BY MOUTH DAILY AT 6 PM. 90 tablet 2   BD PEN NEEDLE NANO 2ND GEN 32G X 4 MM MISC USE DAILY AS DIRECTED 100 each 3   carvedilol (COREG) 12.5 MG tablet TAKE 1 TABLET (12.5 MG TOTAL) BY MOUTH 2 (TWO) TIMES DAILY WITH A MEAL. 180 tablet 2   furosemide (LASIX) 20 MG tablet TAKE 1 TABLET (20 MG TOTAL) BY MOUTH DAILY AS NEEDED (FOR SHORTNESS OF BREATH AND SWELLING.).     glucose blood (FREESTYLE LITE) test strip 1 each by Other route 2 (two) times daily. And lancets 2/day 200 each 3   hydrALAZINE (APRESOLINE) 50 MG tablet Take 50 mg by mouth 2 (two) times daily.     Lancets (FREESTYLE) lancets Use to monitor glucose levels BID; E11.22 100 each 12   lidocaine (LIDODERM) 5 % Place 1 patch onto the skin daily as needed. Remove & Discard patch within 12 hours or as directed by MD 10 patch 0   nitroGLYCERIN (NITROSTAT) 0.4 MG SL tablet Place 1 tablet (0.4 mg total) under the tongue every 5 (five) minutes x 3 doses  as needed for chest pain. Don't take within 24 hrs of Cialis 25 tablet 0   Vitamin D, Ergocalciferol, (DRISDOL) 50000 units CAPS capsule Take 50,000 Units by mouth every Monday.      No current facility-administered medications on file prior to visit.    No Known Allergies  Family History  Problem Relation Age of Onset   Diabetes Mother    Diabetes Father    Hypertension Father    Diabetes Maternal Grandfather    Diabetes Paternal Grandfather    Diabetes Brother    Hypertension Brother     BP 130/60   Pulse 78   Ht 6\' 3"  (1.905 m)   Wt 255 lb 12.8 oz (116 kg)   SpO2 98%   BMI 31.97 kg/m    Review of Systems HB is mild.  He seldom has hypoglycemia, and these episodes are mild.      Objective:   Physical Exam    Lab Results  Component Value Date   HGBA1C 7.2 (A) 06/06/2021      Assessment &  Plan:  Insulin-requiring type 2 DM: HB, due to Trulicity.  We discussed.  He wants to try increasing anyway.   Patient Instructions  I have sent 2 prescriptions to your pharmacy: to increase the Trulicity, and decrease the insulin again.  check your blood sugar twice a day.  vary the time of day when you check, between before the 3 meals, and at bedtime.  also check if you have symptoms of your blood sugar being too high or too low.  please keep a record of the readings and bring it to your next appointment here (or you can bring the meter itself).  You can write it on any piece of paper.  please call us sooner if your blood sugar goes below 70, or if you have a lot of readings over 200.   Please come back for a follow-up appointment in 2-3 months.

## 2021-07-12 ENCOUNTER — Other Ambulatory Visit: Payer: Self-pay | Admitting: Internal Medicine

## 2021-07-12 DIAGNOSIS — E785 Hyperlipidemia, unspecified: Secondary | ICD-10-CM

## 2021-07-24 ENCOUNTER — Ambulatory Visit (HOSPITAL_BASED_OUTPATIENT_CLINIC_OR_DEPARTMENT_OTHER): Payer: 59 | Admitting: Internal Medicine

## 2021-08-03 ENCOUNTER — Other Ambulatory Visit: Payer: Self-pay | Admitting: Internal Medicine

## 2021-08-27 ENCOUNTER — Ambulatory Visit: Payer: 59 | Admitting: Internal Medicine

## 2021-09-05 ENCOUNTER — Other Ambulatory Visit: Payer: Self-pay

## 2021-09-05 ENCOUNTER — Ambulatory Visit: Payer: 59 | Admitting: Endocrinology

## 2021-09-05 VITALS — BP 140/80 | HR 83 | Ht 75.0 in | Wt 254.6 lb

## 2021-09-05 DIAGNOSIS — Z794 Long term (current) use of insulin: Secondary | ICD-10-CM | POA: Diagnosis not present

## 2021-09-05 DIAGNOSIS — N183 Chronic kidney disease, stage 3 unspecified: Secondary | ICD-10-CM

## 2021-09-05 DIAGNOSIS — E1122 Type 2 diabetes mellitus with diabetic chronic kidney disease: Secondary | ICD-10-CM

## 2021-09-05 LAB — POCT GLYCOSYLATED HEMOGLOBIN (HGB A1C): Hemoglobin A1C: 7.9 % — AB (ref 4.0–5.6)

## 2021-09-05 MED ORDER — LEVEMIR FLEXTOUCH 100 UNIT/ML ~~LOC~~ SOPN: 28.0000 [IU] | PEN_INJECTOR | SUBCUTANEOUS | 3 refills | Status: DC

## 2021-09-05 NOTE — Patient Instructions (Addendum)
I have sent a prescription to your pharmacy, to increase the Levemir.   ?check your blood sugar twice a day.  vary the time of day when you check, between before the 3 meals, and at bedtime.  also check if you have symptoms of your blood sugar being too high or too low.  please keep a record of the readings and bring it to your next appointment here (or you can bring the meter itself).  You can write it on any piece of paper.  please call us sooner if your blood sugar goes below 70, or if you have a lot of readings over 200.    ?Blood tests are requested for you today.  We'll let you know about the results.   ?Please come back for a follow-up appointment in May.   ?

## 2021-09-05 NOTE — Progress Notes (Signed)
? ?Subjective:  ? ? Patient ID: Alexander [name], male    DOB: Nov 17, 1970, 51 y.o.   MRN: [id number] ? ?HPI ?Pt returns for f/u of diabetes mellitus:  ?DM type: insulin-requiring type 2.   ?Dx'ed: 2004 ?Complications: PN, CRI, and NPDR.   ?Therapy: insulin since 7829, and Trulicity  ?DKA: never.  ?Severe hypoglycemia: never.   ?Pancreatitis: never.  ?Other: he declines multiple daily injections.  He declines weight loss surgery.   ?Interval history: no cbg record, but states cbg's vary from 130-195.  There is no trend throughout the day.  pt states he feels well in general.  ?Past Medical History:  ?Diagnosis Date  ? CHEST PAIN 08/28/2009  ? DIABETES MELLITUS, TYPE II 03/20/2007  ? HYPERCHOLESTEROLEMIA 07/21/2008  ? HYPERTENSION 03/20/2007  ? Hypertensive urgency 03/27/2015  ? Left ventricular dysfunction 03/28/2015  ? EF 40-45%, no WMA, grade 2 diastolic dysfunction  ? Renal disorder   ? ? ?Past Surgical History:  ?Procedure Laterality Date  ? KNEE ARTHROSCOPY Right   ? ? ?Social History  ? ?Socioeconomic History  ? Marital status: Married  ?  Spouse name: Not on file  ? Number of children: Not on file  ? Years of education: Not on file  ? Highest education level: Not on file  ?Occupational History  ? Occupation: English as a second language teacher  ?  Employer: [employer]  ?Tobacco Use  ? Smoking status: Never  ? Smokeless tobacco: Never  ?Substance and Sexual Activity  ? Alcohol use: No  ? Drug use: No  ? Sexual activity: Not on file  ?Other Topics Concern  ? Not on file  ?Social History Narrative  ? Not on file  ? ?Social Determinants of Health  ? ?Financial Resource Strain: Not on file  ?Food Insecurity: Not on file  ?Transportation Needs: Not on file  ?Physical Activity: Not on file  ?Stress: Not on file  ?Social Connections: Not on file  ?Intimate Partner Violence: Not on file  ? ? ?Current Outpatient Medications on File Prior to Visit  ?Medication Sig Dispense Refill  ? acetaminophen (TYLENOL) 500 MG tablet Take 500 mg by mouth every 6 (six)  hours as needed for headache (pain).    ? aspirin EC 81 MG EC tablet Take 1 tablet (81 mg total) by mouth daily.    ? BD PEN NEEDLE NANO 2ND GEN 32G X 4 MM MISC USE DAILY AS DIRECTED 100 each 3  ? carvedilol (COREG) 12.5 MG tablet TAKE 1 TABLET (12.5 MG TOTAL) BY MOUTH 2 (TWO) TIMES DAILY WITH A MEAL. 180 tablet 1  ? Dulaglutide (TRULICITY) 4.5 FA/2.1HY SOPN Inject 4.5 mg as directed once a week. 6 mL 3  ? furosemide (LASIX) 20 MG tablet TAKE 1 TABLET (20 MG TOTAL) BY MOUTH DAILY AS NEEDED (FOR SHORTNESS OF BREATH AND SWELLING.).    ? glucose blood (FREESTYLE LITE) test strip 1 each by Other route 2 (two) times daily. And lancets 2/day 200 each 3  ? hydrALAZINE (APRESOLINE) 50 MG tablet Take 50 mg by mouth 2 (two) times daily.    ? Lancets (FREESTYLE) lancets Use to monitor glucose levels BID; E11.22 100 each 12  ? lidocaine (LIDODERM) 5 % Place 1 patch onto the skin daily as needed. Remove & Discard patch within 12 hours or as directed by MD 10 patch 0  ? nitroGLYCERIN (NITROSTAT) 0.4 MG SL tablet Place 1 tablet (0.4 mg total) under the tongue every 5 (five) minutes x 3 doses as needed for chest pain.  Don't take within 24 hrs of Cialis 25 tablet 0  ? Vitamin D, Ergocalciferol, (DRISDOL) 50000 units CAPS capsule Take 50,000 Units by mouth every Monday.     ? ?No current facility-administered medications on file prior to visit.  ? ? ?No Known Allergies ? ?Family History  ?Problem Relation Age of Onset  ? Diabetes Mother   ? Diabetes Father   ? Hypertension Father   ? Diabetes Maternal Grandfather   ? Diabetes Paternal Grandfather   ? Diabetes Brother   ? Hypertension Brother   ? ? ?BP 140/80   Pulse 83   Ht 6\' 3"  (1.905 m)   Wt 254 lb 9.6 oz (115.5 kg)   SpO2 97%   BMI 31.82 kg/m?  ? ? ?Review of Systems ?He denies hypoglycemia ?   ?Objective:  ? Physical Exam ?VITAL SIGNS:  See vs page ?GENERAL: no distress.   ? ? ?Lab Results  ?Component Value Date  ? TSH 1.396 04/05/2017  ? ?Lab Results  ?Component Value Date   ? CREATININE 2.34 (H) 04/11/2017  ? BUN 27 (H) 04/11/2017  ? NA 136 04/11/2017  ? K 3.9 04/11/2017  ? CL 101 04/11/2017  ? CO2 27 04/11/2017  ? ?Lab Results  ?Component Value Date  ? HGBA1C 7.9 (A) 09/05/2021  ? ?   ?Assessment & Plan:  ?Insulin-requiring type 2 DM: uncontrolled ? ? ?Patient Instructions  ?I have sent a prescription to your pharmacy, to increase the Levemir.   ?check your blood sugar twice a day.  vary the time of day when you check, between before the 3 meals, and at bedtime.  also check if you have symptoms of your blood sugar being too high or too low.  please keep a record of the readings and bring it to your next appointment here (or you can bring the meter itself).  You can write it on any piece of paper.  please call us sooner if your blood sugar goes below 70, or if you have a lot of readings over 200.    ?Blood tests are requested for you today.  We'll let you know about the results.   ?Please come back for a follow-up appointment in May.   ? ? ?

## 2021-09-06 ENCOUNTER — Other Ambulatory Visit: Payer: Self-pay | Admitting: Internal Medicine

## 2021-09-06 LAB — BASIC METABOLIC PANEL
BUN: 41 mg/dL — ABNORMAL HIGH (ref 6–23)
CO2: 25 mEq/L (ref 19–32)
Calcium: 9.5 mg/dL (ref 8.4–10.5)
Chloride: 102 mEq/L (ref 96–112)
Creatinine, Ser: 2.9 mg/dL — ABNORMAL HIGH (ref 0.40–1.50)
GFR: 24.59 mL/min — ABNORMAL LOW (ref >60.00)
Glucose, Bld: 132 mg/dL — ABNORMAL HIGH (ref 70–99)
Potassium: 4.4 mEq/L (ref 3.5–5.1)
Sodium: 139 mEq/L (ref 135–145)

## 2021-09-06 LAB — TSH: TSH: 1.07 u[IU]/mL (ref 0.35–5.50)

## 2021-09-06 LAB — T4, FREE: Free T4: 0.9 ng/dL (ref 0.60–1.60)

## 2021-09-07 LAB — FRUCTOSAMINE: Fructosamine: 361 umol/L — ABNORMAL HIGH (ref 205–285)

## 2021-10-26 ENCOUNTER — Encounter: Payer: Self-pay | Admitting: Nurse Practitioner

## 2021-10-26 ENCOUNTER — Telehealth: Payer: Self-pay | Admitting: Nurse Practitioner

## 2021-10-26 ENCOUNTER — Ambulatory Visit: Payer: 59 | Admitting: Nurse Practitioner

## 2021-10-26 VITALS — BP 138/80 | HR 84 | Temp 98.4°F | Ht 75.0 in | Wt 250.0 lb

## 2021-10-26 DIAGNOSIS — E785 Hyperlipidemia, unspecified: Secondary | ICD-10-CM | POA: Diagnosis not present

## 2021-10-26 DIAGNOSIS — I10 Essential (primary) hypertension: Secondary | ICD-10-CM

## 2021-10-26 DIAGNOSIS — N184 Chronic kidney disease, stage 4 (severe): Secondary | ICD-10-CM

## 2021-10-26 DIAGNOSIS — E559 Vitamin D deficiency, unspecified: Secondary | ICD-10-CM

## 2021-10-26 DIAGNOSIS — Z1211 Encounter for screening for malignant neoplasm of colon: Secondary | ICD-10-CM

## 2021-10-26 DIAGNOSIS — K219 Gastro-esophageal reflux disease without esophagitis: Secondary | ICD-10-CM

## 2021-10-26 MED ORDER — VITAMIN D (ERGOCALCIFEROL) 1.25 MG (50000 UNIT) PO CAPS
50000.0000 [IU] | ORAL_CAPSULE | ORAL | 2 refills | Status: AC
Start: 2021-10-29 — End: ?

## 2021-10-26 NOTE — Assessment & Plan Note (Signed)
Referral to GI made today. 

## 2021-10-26 NOTE — Telephone Encounter (Signed)
Patient established care today with myself.  He was a patient of Dr. Nathanial Millman in the past.  He tells me its been about 3 years since he was seen so when he went to schedule follow-up with Dr. Sharlet Salina, he was told he needed to schedule as a new patient.  I had sooner availability for new patient appointment which is why he came to see me however, he would like to stay with Dr. Sharlet Salina if possible. ? ?Dr. Sharlet Salina, when you have time would you review his chart and see if you would accept him as a primary care patient of yours? ?

## 2021-10-26 NOTE — Addendum Note (Signed)
Addended by: Jeralyn Ruths E on: 10/26/2021 12:17 PM ? ? Modules accepted: Orders ? ?

## 2021-10-26 NOTE — Assessment & Plan Note (Signed)
Chronic, stable.  Refill for ergocalciferol 50,000 units by mouth weekly prescribed today.  We will also recheck vitamin D/calcium serum level.  Further recommendations may be made based upon these results. ?

## 2021-10-26 NOTE — Progress Notes (Addendum)
Subjective:  Patient ID: Alexander [name], male    DOB: 03/21/1971  Age: 51 y.o. MRN: [id number]  CC:  Chief Complaint  Patient presents with   Re-establishing care    Patient has developed reflux in the last year or two      HPI  This patient arrives today for the above.  He was a patient of Dr. Nathanial Millman previously, however did not follow-up with this office in approximately 3 years.  He returns to reestablish care.   He has been experiencing heartburn over the last few weeks.  He tells me it is triggered by certain foods as well as sometimes laying down.  He is wondering what he can take for the treatment of this.  He has CKD stage IV as well as vitamin D deficiency.  He is requesting refill on his ergocalciferol.  Per chart review last vitamin D level was collected outside of our health system on 05/28/21 and it was 44.  Last metabolic panel was collected early last month which did show a GFR of 24.59. (Creatinine clearance approximately 42).  He follows with nephrology on a regular basis.  He has type 2 diabetes and continues on Trulicity and Levemir.  He follows with endocrinology on a regular basis.  Last A1c was 7.9.   He has hyperlipidemia, hypertension, and heart failure.  He follows with cardiology on a regular basis as well.  He reports that he plans on seeing Dr. Debara Pickett in approximately 2 months.  He reports that he needs to have fasting lipid panel collected prior to this appointment.  He continues on atorvastatin.  Past Medical History:  Diagnosis Date   CHEST PAIN 08/28/2009   DIABETES MELLITUS, TYPE II 03/20/2007   HYPERCHOLESTEROLEMIA 07/21/2008   HYPERTENSION 03/20/2007   Hypertensive urgency 03/27/2015   Left ventricular dysfunction 03/28/2015   EF 40-45%, no WMA, grade 2 diastolic dysfunction   Renal disorder       Family History  Problem Relation Age of Onset   Diabetes Mother    Diabetes Father    Hypertension Father    Diabetes Maternal Grandfather     Diabetes Paternal Grandfather    Diabetes Brother    Hypertension Brother     Social History   Social History Narrative   Not on file   Social History   Tobacco Use   Smoking status: Never   Smokeless tobacco: Never  Substance Use Topics   Alcohol use: No     Current Meds  Medication Sig   aspirin EC 81 MG EC tablet Take 1 tablet (81 mg total) by mouth daily.   atorvastatin (LIPITOR) 40 MG tablet Take 1 tablet (40 mg total) by mouth daily. Keep upcoming appointment for future refills.   carvedilol (COREG) 12.5 MG tablet TAKE 1 TABLET (12.5 MG TOTAL) BY MOUTH 2 (TWO) TIMES DAILY WITH A MEAL.   Dulaglutide (TRULICITY) 4.5 UY/4.0HK SOPN Inject 4.5 mg as directed once a week.   furosemide (LASIX) 20 MG tablet TAKE 1 TABLET (20 MG TOTAL) BY MOUTH DAILY AS NEEDED (FOR SHORTNESS OF BREATH AND SWELLING.).   glucose blood (FREESTYLE LITE) test strip 1 each by Other route 2 (two) times daily. And lancets 2/day   hydrALAZINE (APRESOLINE) 50 MG tablet Take 50 mg by mouth 2 (two) times daily.   insulin detemir (LEVEMIR FLEXTOUCH) 100 UNIT/ML FlexPen Inject 28 Units into the skin every morning. And pen needles 1/day   Lancets (FREESTYLE) lancets Use to monitor  glucose levels BID; E11.22   nitroGLYCERIN (NITROSTAT) 0.4 MG SL tablet Place 1 tablet (0.4 mg total) under the tongue every 5 (five) minutes x 3 doses as needed for chest pain. Don't take within 24 hrs of Cialis   [DISCONTINUED] Vitamin D, Ergocalciferol, (DRISDOL) 50000 units CAPS capsule Take 50,000 Units by mouth every Monday.     ROS:  Review of Systems  Constitutional:  Negative for fever and weight loss.  Respiratory:  Negative for shortness of breath.   Cardiovascular:  Negative for chest pain.  Gastrointestinal:  Negative for abdominal pain.  Neurological:  Negative for dizziness, seizures, loss of consciousness and headaches.  Psychiatric/Behavioral:  Negative for depression and suicidal ideas.     Objective:    Today's Vitals: BP 138/80 (BP Location: Right Arm, Patient Position: Sitting, Cuff Size: Large)   Pulse 84   Temp 98.4 F (36.9 C) (Oral)   Ht '6\' 3"'$  (1.905 m)   Wt 250 lb (113.4 kg)   SpO2 96%   BMI 31.25 kg/m     10/26/2021   10:01 AM 09/05/2021    3:51 PM 06/06/2021    4:16 PM  Vitals with BMI  Height '6\' 3"'$  '6\' 3"'$  '6\' 3"'$   Weight 250 lbs 254 lbs 10 oz 255 lbs 13 oz  BMI 31.25 33.29 51.88  Systolic 416 606 301  Diastolic 80 80 60  Pulse 84 83 78        10/26/2021   10:00 AM 04/11/2017    3:46 PM 12/01/2014    4:01 PM  PHQ9 SCORE ONLY  PHQ-9 Total Score 0 0 0     Physical Exam Vitals reviewed.  Constitutional:      Appearance: Normal appearance.  HENT:     Head: Normocephalic and atraumatic.  Cardiovascular:     Rate and Rhythm: Normal rate and regular rhythm.  Pulmonary:     Effort: Pulmonary effort is normal.     Breath sounds: Normal breath sounds.  Musculoskeletal:     Cervical back: Neck supple.  Skin:    General: Skin is warm and dry.  Neurological:     Mental Status: He is alert and oriented to person, place, and time.  Psychiatric:        Mood and Affect: Mood normal.        Behavior: Behavior normal.        Thought Content: Thought content normal.        Judgment: Judgment normal.         Assessment and Plan   1. Vitamin D deficiency   2. Essential hypertension   3. Stage 4 chronic kidney disease ([place])   4. Dyslipidemia   5. Gastroesophageal reflux disease, unspecified whether esophagitis present   6. Colon cancer screening      Plan: See plan via problem list below.   Tests ordered Orders Placed This Encounter  Procedures   CBC   Basic Metabolic Panel (BMET)   Lipid Profile   Vitamin D (25 hydroxy)   Ambulatory referral to Gastroenterology      Meds ordered this encounter  Medications   Vitamin D, Ergocalciferol, (DRISDOL) 1.25 MG (50000 UNIT) CAPS capsule    Sig: Take 1 capsule (50,000 Units total) by mouth every  Monday.    Dispense:  5 capsule    Refill:  2    Patient to follow-up in 1 month for annual physical exam, or sooner as needed.  Ailene Ards, NP

## 2021-10-26 NOTE — Assessment & Plan Note (Addendum)
Acute, history consistent with GERD.  Triggered by specific foods sometimes worsen when laying down, recommended he trial OTC famotidine 20 mg by mouth daily as needed (creatinine clearance based on last metabolic panel was approximately 42).  If symptoms persist may consider taking daily for 6 weeks.

## 2021-10-26 NOTE — Assessment & Plan Note (Signed)
Chronic, encouraged to follow-up with nephrology as scheduled.  He plans on doing so.  We will check metabolic panel for stability today, further recommendations to be made based upon these results. ?

## 2021-10-26 NOTE — Patient Instructions (Addendum)
Famotidine (PEPCID) '20mg'$ /day as needed for heart burn. If you need it every day, please discuss at next office visit. We may need to consider trialing it daily for 6 weeks and then stopping it. If symptoms persist past 6 weeks, then we would need to consider further workup for possible gastritis or ulcer.  ? ?Wilder Glade - discuss this with your nephrologist ? ?Dapagliflozin Tablets ?What is this medication? ?DAPAGLIFLOZIN (DAP a gli FLOE zin) treats type 2 diabetes. It works by helping your kidneys remove sugar (glucose) from your blood through the urine, which decreases your blood sugar. It can also be used to lower the risk of heart attack, stroke, kidney disease, and hospitalization for heart failure in people with type 2 diabetes. Changes to diet and exercise are often combined with this medication. ?This medicine may be used for other purposes; ask your health care provider or pharmacist if you have questions. ?COMMON BRAND NAME(S): Wilder Glade ?What should I tell my care team before I take this medication? ?They need to know if you have any of these conditions: ?Dehydration ?Diabetic ketoacidosis ?Diet low in salt ?Eating less due to illness, surgery, dieting, or any other reason ?Frequently drink alcohol ?Having surgery ?History of pancreatitis or pancreas problems ?History of yeast infection of the penis or vagina ?Infection in the bladder, kidneys, or urinary tract ?Kidney disease ?Low blood pressure ?On dialysis ?Problems urinating ?Type 1 diabetes ?Uncircumcised male ?An unusual or allergic reaction to dapagliflozin, other medications, foods, dyes, or preservatives ?Pregnant or trying to get pregnant ?Breast-feeding ?How should I use this medication? ?Take this medication by mouth with water. Take it as directed on the prescription label at the same time every day. You can take it with or without food. If it upsets your stomach, take it with food. Keep taking it unless your care team tells you to stop. ?A  special MedGuide will be given to you by the pharmacist with each prescription and refill. Be sure to read this information carefully each time. ?Talk to your care team about the use of this medication in children. Special care may be needed. ?Overdosage: If you think you have taken too much of this medicine contact a poison control center or emergency room at once. ?NOTE: This medicine is only for you. Do not share this medicine with others. ?What if I miss a dose? ?If you miss a dose, take it as soon as you can. If it is almost time for your next dose, take only that dose. Do not take double or extra doses. ?What may interact with this medication? ?Lithium ?Sulfonylureas, such as glimepiride, glipizide, glyburide ?This list may not describe all possible interactions. Give your health care provider a list of all the medicines, herbs, non-prescription drugs, or dietary supplements you use. Also tell them if you smoke, drink alcohol, or use illegal drugs. Some items may interact with your medicine. ?What should I watch for while using this medication? ?Visit your care team for regular checks on your progress. Tell your care team if your symptoms do not start to get better or if they get worse. ?This medication can cause a serious condition in which there is too much acid in the blood. If you develop nausea, vomiting, stomach pain, unusual tiredness, or breathing problems, stop taking this medication and call your care team right away. If possible, use a ketone dipstick to check for ketones in your urine. ?Check with your care team if you have severe diarrhea, nausea, and vomiting, or if  you sweat a lot. The loss of too much body fluid may make it dangerous for you to take this medication. ?A test called the HbA1C (A1C) will be monitored. This is a simple blood test. It measures your blood sugar control over the last 2 to 3 months. You will receive this test every 3 to 6 months. ?Learn how to check your blood sugar.  Learn the symptoms of low and high blood sugar and how to manage them. ?Always carry a quick-source of sugar with you in case you have symptoms of low blood sugar. Examples include hard sugar candy or glucose tablets. Make sure others know that you can choke if you eat or drink when you develop serious symptoms of low blood sugar, such as seizures or unconsciousness. Get medical help at once. ?Tell your care team if you have high blood sugar. You might need to change the dose of your medication. If you are sick or exercising more than usual, you may need to change the dose of your medication. ?Do not skip meals. Ask your care team if you should avoid alcohol. Many nonprescription cough and cold products contain sugar or alcohol. These can affect blood sugar. ?Wear a medical ID bracelet or chain. Carry a card that describes your condition. List the medications and doses you take on the card. ?What side effects may I notice from receiving this medication? ?Side effects that you should report to your care team as soon as possible: ?Allergic reactions--skin rash, itching, hives, swelling of the face, lips, tongue, or throat ?Dehydration--increased thirst, dry mouth, feeling faint or lightheaded, headache, dark yellow or brown urine ?Diabetic ketoacidosis (DKA)--increased thirst or amount of urine, dry mouth, fatigue, fruity odor to breath, trouble breathing, stomach pain, nausea, vomiting ?Genital yeast infection--redness, swelling, pain, or itchiness, odor, thick or lumpy discharge ?New pain or tenderness, change in skin color, sores or ulcers, infection of the leg or foot ?Infection or redness, swelling, tenderness, or pain in the genitals, or area from the genitals to the back of the rectum ?Urinary tract infection (UTI)--burning when passing urine, passing frequent small amounts of urine, bloody or cloudy urine, pain in the lower back or sides ?This list may not describe all possible side effects. Call your doctor  for medical advice about side effects. You may report side effects to FDA at 1-800-FDA-1088. ?Where should I keep my medication? ?Keep out of the reach of children and pets. ?Store at room temperature between 20 and 25 degrees C (68 and 77 degrees F). Get rid of any unused medication after the expiration date. ?To get rid of medications that are no longer needed or have expired: ?Take the medication to a medication take-back program. Check with your pharmacy or law enforcement to find a location. ?If you cannot return the medication, check the label or package insert to see if the medication should be thrown out in the garbage or flushed down the toilet. If you are not sure, ask your care team. If it is safe to put it in the trash, take the medication out of the container. Mix the medication with cat litter, dirt, coffee grounds, or other unwanted substance. Seal the mixture in a bag or container. Put it in the trash. ?NOTE: This sheet is a summary. It may not cover all possible information. If you have questions about this medicine, talk to your doctor, pharmacist, or health care provider. ?? 2023 Elsevier/Gold Standard (2021-04-26 00:00:00) ? ?

## 2021-10-26 NOTE — Assessment & Plan Note (Signed)
Chronic, stable.  He will continue on Coreg 12.5 mg twice daily and hydralazine 50 mg twice daily.  He will follow-up with endocrinology, cardiology, and nephrology as scheduled. ?

## 2021-10-26 NOTE — Assessment & Plan Note (Signed)
Chronic, patient to continue taking Lipitor '40mg'$  and aspirin 81 mg daily as prescribed.  He will have blood work collected next week when he comes back in a fasting state in preparation for upcoming appoint with Dr. Debara Pickett.  Further recommendations may be made based upon these results. ?

## 2021-10-29 NOTE — Telephone Encounter (Signed)
Fine for TOC back, [place] can you send this to front pool? ?

## 2021-11-07 ENCOUNTER — Other Ambulatory Visit: Payer: Self-pay | Admitting: Endocrinology

## 2021-11-15 ENCOUNTER — Ambulatory Visit: Payer: 59 | Admitting: Endocrinology

## 2021-11-30 ENCOUNTER — Encounter: Payer: Self-pay | Admitting: Physician Assistant

## 2021-11-30 ENCOUNTER — Encounter: Payer: Self-pay | Admitting: Nurse Practitioner

## 2021-11-30 ENCOUNTER — Ambulatory Visit (INDEPENDENT_AMBULATORY_CARE_PROVIDER_SITE_OTHER): Payer: 59 | Admitting: Nurse Practitioner

## 2021-11-30 VITALS — BP 126/82 | HR 88 | Temp 98.5°F | Ht 75.0 in | Wt 253.0 lb

## 2021-11-30 DIAGNOSIS — Z1159 Encounter for screening for other viral diseases: Secondary | ICD-10-CM

## 2021-11-30 DIAGNOSIS — E785 Hyperlipidemia, unspecified: Secondary | ICD-10-CM | POA: Diagnosis not present

## 2021-11-30 DIAGNOSIS — Z125 Encounter for screening for malignant neoplasm of prostate: Secondary | ICD-10-CM | POA: Diagnosis not present

## 2021-11-30 DIAGNOSIS — Z0001 Encounter for general adult medical examination with abnormal findings: Secondary | ICD-10-CM | POA: Diagnosis not present

## 2021-11-30 DIAGNOSIS — N184 Chronic kidney disease, stage 4 (severe): Secondary | ICD-10-CM | POA: Diagnosis not present

## 2021-11-30 DIAGNOSIS — E669 Obesity, unspecified: Secondary | ICD-10-CM

## 2021-11-30 DIAGNOSIS — I10 Essential (primary) hypertension: Secondary | ICD-10-CM

## 2021-11-30 DIAGNOSIS — K219 Gastro-esophageal reflux disease without esophagitis: Secondary | ICD-10-CM

## 2021-11-30 DIAGNOSIS — Z6831 Body mass index (BMI) 31.0-31.9, adult: Secondary | ICD-10-CM

## 2021-11-30 DIAGNOSIS — R351 Nocturia: Secondary | ICD-10-CM

## 2021-11-30 LAB — CBC
HCT: 38.7 % — ABNORMAL LOW (ref 39.0–52.0)
Hemoglobin: 12.8 g/dL — ABNORMAL LOW (ref 13.0–17.0)
MCHC: 33.1 g/dL (ref 30.0–36.0)
MCV: 81.8 fl (ref 78.0–100.0)
Platelets: 281 10*3/uL (ref 150.0–400.0)
RBC: 4.73 Mil/uL (ref 4.22–5.81)
RDW: 14.7 % (ref 11.5–15.5)
WBC: 9.4 10*3/uL (ref 4.0–10.5)

## 2021-11-30 LAB — LIPID PANEL
Cholesterol: 149 mg/dL (ref 0–200)
HDL: 45.5 mg/dL (ref >39.00)
LDL Cholesterol: 87 mg/dL (ref 0–99)
NonHDL: 103.15
Total CHOL/HDL Ratio: 3
Triglycerides: 80 mg/dL (ref 0.0–149.0)
VLDL: 16 mg/dL (ref 0.0–40.0)

## 2021-11-30 LAB — PSA: PSA: 0.79 ng/mL (ref 0.10–4.00)

## 2021-11-30 LAB — BASIC METABOLIC PANEL
BUN: 40 mg/dL — ABNORMAL HIGH (ref 6–23)
CO2: 28 mEq/L (ref 19–32)
Calcium: 9.8 mg/dL (ref 8.4–10.5)
Chloride: 103 mEq/L (ref 96–112)
Creatinine, Ser: 2.73 mg/dL — ABNORMAL HIGH (ref 0.40–1.50)
GFR: 26.39 mL/min — ABNORMAL LOW (ref >60.00)
Glucose, Bld: 144 mg/dL — ABNORMAL HIGH (ref 70–99)
Potassium: 4.7 mEq/L (ref 3.5–5.1)
Sodium: 141 mEq/L (ref 135–145)

## 2021-11-30 LAB — TSH: TSH: 0.92 u[IU]/mL (ref 0.35–5.50)

## 2021-11-30 LAB — VITAMIN D 25 HYDROXY (VIT D DEFICIENCY, FRACTURES): VITD: 52.43 ng/mL (ref 30.00–100.00)

## 2021-11-30 MED ORDER — NITROGLYCERIN 0.4 MG SL SUBL: 0.4000 mg | SUBLINGUAL_TABLET | SUBLINGUAL | 0 refills | Status: AC | PRN

## 2021-11-30 NOTE — Progress Notes (Addendum)
Subjective:  Patient ID: Alexander [name], male    DOB: 08/10/1970  Age: 51 y.o. MRN: [id number]  CC:  Chief Complaint  Patient presents with   Annual Exam      HPI  This patient arrives today for the above.  Health maintenance: Discussed Hep C screening due once in lifetime, shingles vaccine due next week when he turns 31, colonoscopy referral already made and planning on making appointment, foot exam completed 3/23, and eye exam completed 1/23.  GERD: Since last office visit patient has been taking Famotidine 20 mg daily as needed with improved symptom relief.   Nocturia: Patient reports getting up 1-2 times at night on occasion, last PSA was 0.68 7/16. Has family history of dad with prostate cancer.  Past Medical History:  Diagnosis Date   CHEST PAIN 08/28/2009   DIABETES MELLITUS, TYPE II 03/20/2007   HYPERCHOLESTEROLEMIA 07/21/2008   HYPERTENSION 03/20/2007   Hypertensive urgency 03/27/2015   Left ventricular dysfunction 03/28/2015   EF 40-45%, no WMA, grade 2 diastolic dysfunction   Renal disorder       Family History  Problem Relation Age of Onset   Diabetes Mother    Diabetes Father    Hypertension Father    Diabetes Maternal Grandfather    Diabetes Paternal Grandfather    Diabetes Brother    Hypertension Brother     Social History   Social History Narrative   Not on file   Social History   Tobacco Use   Smoking status: Never   Smokeless tobacco: Never  Substance Use Topics   Alcohol use: No     Current Meds  Medication Sig   aspirin EC 81 MG EC tablet Take 1 tablet (81 mg total) by mouth daily.   atorvastatin (LIPITOR) 40 MG tablet Take 1 tablet (40 mg total) by mouth daily. Keep upcoming appointment for future refills.   BD PEN NEEDLE NANO U/F 32G X 4 MM MISC USE DAILY AS DIRECTED   carvedilol (COREG) 12.5 MG tablet TAKE 1 TABLET (12.5 MG TOTAL) BY MOUTH 2 (TWO) TIMES DAILY WITH A MEAL.   Dulaglutide (TRULICITY) 4.5 YH/0.6CB SOPN Inject  4.5 mg as directed once a week.   furosemide (LASIX) 20 MG tablet TAKE 1 TABLET (20 MG TOTAL) BY MOUTH DAILY AS NEEDED (FOR SHORTNESS OF BREATH AND SWELLING.).   glucose blood (FREESTYLE LITE) test strip 1 each by Other route 2 (two) times daily. And lancets 2/day   hydrALAZINE (APRESOLINE) 50 MG tablet Take 50 mg by mouth 2 (two) times daily.   hydrALAZINE (APRESOLINE) 50 MG tablet Take 1 tablet by mouth 2 (two) times daily.   insulin detemir (LEVEMIR FLEXTOUCH) 100 UNIT/ML FlexPen Inject 28 Units into the skin every morning. And pen needles 1/day   Lancets (FREESTYLE) lancets Use to monitor glucose levels BID; E11.22   Vitamin D, Ergocalciferol, (DRISDOL) 1.25 MG (50000 UNIT) CAPS capsule Take 1 capsule (50,000 Units total) by mouth every Monday.   [DISCONTINUED] nitroGLYCERIN (NITROSTAT) 0.4 MG SL tablet Place 1 tablet (0.4 mg total) under the tongue every 5 (five) minutes x 3 doses as needed for chest pain. Don't take within 24 hrs of Cialis    ROS:  Review of Systems  Constitutional:  Negative for fever, malaise/fatigue and weight loss.  HENT:  Negative for congestion and ear pain.   Respiratory:  Positive for shortness of breath (takes lasix as needed for symptom relief). Negative for cough.   Cardiovascular:  Negative for  chest pain and leg swelling.  Gastrointestinal:  Positive for heartburn (better with Prilosec). Negative for abdominal pain, constipation and diarrhea.  Genitourinary:        Nocturia - sometimes  Musculoskeletal:  Negative for joint pain and myalgias.  Skin:  Negative for itching and rash.  Neurological:  Negative for weakness and headaches.  Endo/Heme/Allergies:  Negative for environmental allergies.  Psychiatric/Behavioral:  Negative for depression. The patient is not nervous/anxious and does not have insomnia.     Objective:   Today's Vitals: BP 126/82 (BP Location: Left Arm, Patient Position: Sitting, Cuff Size: Large)   Pulse 88   Temp 98.5 F (36.9 C)  (Oral)   Ht '6\' 3"'$  (1.905 m)   Wt 253 lb (114.8 kg)   SpO2 96%   BMI 31.62 kg/m     11/30/2021   11:04 AM 10/26/2021   10:01 AM 09/05/2021    3:51 PM  Vitals with BMI  Height '6\' 3"'$  '6\' 3"'$  '6\' 3"'$   Weight 253 lbs 250 lbs 254 lbs 10 oz  BMI 31.62 53.74 82.70  Systolic 786 754 492  Diastolic 82 80 80  Pulse 88 84 83     Physical Exam Constitutional:      Appearance: Normal appearance. He is obese.  HENT:     Right Ear: Tympanic membrane normal.     Left Ear: Tympanic membrane normal.     Nose: Nose normal.  Eyes:     Extraocular Movements: Extraocular movements intact.     Conjunctiva/sclera: Conjunctivae normal.     Pupils: Pupils are equal, round, and reactive to light.  Cardiovascular:     Rate and Rhythm: Normal rate and regular rhythm.     Pulses: Normal pulses.     Heart sounds: Normal heart sounds.  Pulmonary:     Effort: Pulmonary effort is normal.     Breath sounds: Normal breath sounds.  Abdominal:     General: Bowel sounds are normal.     Tenderness: There is no abdominal tenderness. There is no guarding.  Musculoskeletal:        General: No swelling.     Cervical back: Neck supple. No tenderness.  Lymphadenopathy:     Cervical: No cervical adenopathy.  Skin:    General: Skin is warm and dry.     Coloration: Skin is not pale.     Findings: No rash.  Neurological:     Mental Status: He is alert and oriented to person, place, and time.  Psychiatric:        Mood and Affect: Mood normal.         Assessment and Plan   1. Encounter for general adult medical examination with abnormal findings   2. Nocturia   3. Prostate cancer screening   4. Essential hypertension   5. Stage 4 chronic kidney disease ([place])   6. Dyslipidemia   7. Encounter for hepatitis C screening test for low risk patient   8. Class 1 obesity without serious comorbidity with body mass index (BMI) of 31.0 to 31.9 in adult, unspecified obesity type   9. Gastroesophageal reflux disease,  unspecified whether esophagitis present      Plan: 1.-3., 6.-8. Blood work collected for further evaluation, further recommendations will be made based on results. Exam grossly normal.  4. BP at goal, continue chronic medications. 5. Kidney function stable per chart review, patient to follow-up with nephrology as scheduled.  9.  Patient encouraged to continue using as needed.  He was  encouraged to discuss this further with GI when he sees him for colon cancer screening.  He reports understanding.   Tests ordered Orders Placed This Encounter  Procedures   TSH   Lipid panel   PSA   Hepatitis C antibody      Meds ordered this encounter  Medications   nitroGLYCERIN (NITROSTAT) 0.4 MG SL tablet    Sig: Place 1 tablet (0.4 mg total) under the tongue every 5 (five) minutes x 3 doses as needed for chest pain. Don't take within 24 hrs of Cialis    Dispense:  25 tablet    Refill:  0    Order Specific Question:   Supervising Provider    Answer:   Binnie Rail [id number]    Patient to follow-up in 6 months or sooner as needed  Ailene Ards, NP

## 2021-11-30 NOTE — Progress Notes (Deleted)
Subjective:  Patient ID: Alexander [name], male    DOB: Aug 06, 1970  Age: 51 y.o. MRN: [id number]  CC:  Chief Complaint  Patient presents with   Annual Exam      HPI  This patient arrives today for the above.  Past Medical History:  Diagnosis Date   CHEST PAIN 08/28/2009   DIABETES MELLITUS, TYPE II 03/20/2007   HYPERCHOLESTEROLEMIA 07/21/2008   HYPERTENSION 03/20/2007   Hypertensive urgency 03/27/2015   Left ventricular dysfunction 03/28/2015   EF 40-45%, no WMA, grade 2 diastolic dysfunction   Renal disorder       Family History  Problem Relation Age of Onset   Diabetes Mother    Diabetes Father    Hypertension Father    Diabetes Maternal Grandfather    Diabetes Paternal Grandfather    Diabetes Brother    Hypertension Brother     Social History   Social History Narrative   Not on file   Social History   Tobacco Use   Smoking status: Never   Smokeless tobacco: Never  Substance Use Topics   Alcohol use: No     Current Meds  Medication Sig   aspirin EC 81 MG EC tablet Take 1 tablet (81 mg total) by mouth daily.   atorvastatin (LIPITOR) 40 MG tablet Take 1 tablet (40 mg total) by mouth daily. Keep upcoming appointment for future refills.   BD PEN NEEDLE NANO U/F 32G X 4 MM MISC USE DAILY AS DIRECTED   carvedilol (COREG) 12.5 MG tablet TAKE 1 TABLET (12.5 MG TOTAL) BY MOUTH 2 (TWO) TIMES DAILY WITH A MEAL.   Dulaglutide (TRULICITY) 4.5 TK/1.6WF SOPN Inject 4.5 mg as directed once a week.   furosemide (LASIX) 20 MG tablet TAKE 1 TABLET (20 MG TOTAL) BY MOUTH DAILY AS NEEDED (FOR SHORTNESS OF BREATH AND SWELLING.).   glucose blood (FREESTYLE LITE) test strip 1 each by Other route 2 (two) times daily. And lancets 2/day   hydrALAZINE (APRESOLINE) 50 MG tablet Take 50 mg by mouth 2 (two) times daily.   hydrALAZINE (APRESOLINE) 50 MG tablet Take 1 tablet by mouth 2 (two) times daily.   insulin detemir (LEVEMIR FLEXTOUCH) 100 UNIT/ML FlexPen Inject 28 Units  into the skin every morning. And pen needles 1/day   Lancets (FREESTYLE) lancets Use to monitor glucose levels BID; E11.22   Vitamin D, Ergocalciferol, (DRISDOL) 1.25 MG (50000 UNIT) CAPS capsule Take 1 capsule (50,000 Units total) by mouth every Monday.   [DISCONTINUED] nitroGLYCERIN (NITROSTAT) 0.4 MG SL tablet Place 1 tablet (0.4 mg total) under the tongue every 5 (five) minutes x 3 doses as needed for chest pain. Don't take within 24 hrs of Cialis    ROS:  ROS   Objective:   Today's Vitals: BP 126/82 (BP Location: Left Arm, Patient Position: Sitting, Cuff Size: Large)   Pulse 88   Temp 98.5 F (36.9 C) (Oral)   Ht '6\' 3"'$  (1.905 m)   Wt 253 lb (114.8 kg)   SpO2 96%   BMI 31.62 kg/m     11/30/2021   11:04 AM 10/26/2021   10:01 AM 09/05/2021    3:51 PM  Vitals with BMI  Height '6\' 3"'$  '6\' 3"'$  '6\' 3"'$   Weight 253 lbs 250 lbs 254 lbs 10 oz  BMI 31.62 09.32 35.57  Systolic 322 025 427  Diastolic 82 80 80  Pulse 88 84 83     Physical Exam       Assessment and  Plan   1. Encounter for general adult medical examination with abnormal findings   2. Nocturia   3. Prostate cancer screening   4. Essential hypertension   5. Stage 4 chronic kidney disease ([place])   6. Dyslipidemia   7. Encounter for hepatitis C screening test for low risk patient   8. Class 1 obesity without serious comorbidity with body mass index (BMI) of 31.0 to 31.9 in adult, unspecified obesity type      Plan: See plan via problem list below.   Tests ordered Orders Placed This Encounter  Procedures   TSH   Lipid panel   PSA   Hepatitis C antibody      Meds ordered this encounter  Medications   nitroGLYCERIN (NITROSTAT) 0.4 MG SL tablet    Sig: Place 1 tablet (0.4 mg total) under the tongue every 5 (five) minutes x 3 doses as needed for chest pain. Don't take within 24 hrs of Cialis    Dispense:  25 tablet    Refill:  0    Order Specific Question:   Supervising Provider    Answer:   Binnie Rail [[id number]]    Patient to follow-up in ***  Ailene Ards, NP

## 2021-12-04 LAB — HEPATITIS C ANTIBODY
Hepatitis C Ab: NONREACTIVE
SIGNAL TO CUT-OFF: 0.1

## 2021-12-08 ENCOUNTER — Other Ambulatory Visit: Payer: Self-pay | Admitting: Internal Medicine

## 2021-12-19 ENCOUNTER — Encounter: Payer: Self-pay | Admitting: Internal Medicine

## 2021-12-19 ENCOUNTER — Ambulatory Visit: Payer: 59 | Admitting: Internal Medicine

## 2021-12-19 VITALS — BP 150/78 | HR 74 | Ht 75.0 in | Wt 254.2 lb

## 2021-12-19 DIAGNOSIS — I491 Atrial premature depolarization: Secondary | ICD-10-CM

## 2021-12-19 DIAGNOSIS — N183 Chronic kidney disease, stage 3 unspecified: Secondary | ICD-10-CM

## 2021-12-19 DIAGNOSIS — I10 Essential (primary) hypertension: Secondary | ICD-10-CM

## 2021-12-19 DIAGNOSIS — I5022 Chronic systolic (congestive) heart failure: Secondary | ICD-10-CM | POA: Diagnosis not present

## 2021-12-19 DIAGNOSIS — E785 Hyperlipidemia, unspecified: Secondary | ICD-10-CM

## 2021-12-19 NOTE — Progress Notes (Signed)
Cardiology Office Note   Date:  12/19/2021   ID:  Laural Golden, DOB 03/16/1971, MRN [id number]  PCP:  Hoyt Koch, MD  Cardiologist:  Dr Charma Igo, MD   No chief complaint on file. No complaints  History of Present Illness: Alexander [name] is a 51 y.o. male with a history of DM, HL, HTN, d/c 09/22 after hospitalization for atyp CP, hypertensive urgency, EF 40-45% by echo w/ gr 2 DD, MV w/ small defect EF 33%. ARF on CKD. He was recently seen by [name] and medications were adjusted. It was noted that his creatinine had risen and he was taken off of ACE inhibitor and a increase of his carvedilol was made. His blood pressure today is very well controlled in the office at 122/76, however I reviewed a blood pressure log from home which indicated varying blood pressures between 741O and 878M systolic as well as diastolics between 90 and 767 at times. This suggests that it may not be as well controlled as we suspect. Is not clear what would be causing his labile blood pressures. He is on 3 times a day hydralazine which he takes without fail. There could be a waning effect of the medication. He does have some chronic kidney disease and another possibility could be some degree of fibromuscular dysplasia or renal artery stenosis. I do not believe this is been evaluated. Of note he had a repeat echocardiogram pleased to say that his EF is now come back to 55-60%. He did have a low risk Myoview in the hospital, but his ejection fraction was reduced. Currently he is asymptomatic and denies chest pain or worsening shortness of breath.  07/12/2016  Alexander [name] returns today for follow-up. He reports feeling well and generally feels like his blood pressure is somewhat elevated although recently saw a nephrologist in [facility] and he felt like his blood pressure was well controlled. Today's blood pressure is 157/82, however I rechecked the blood pressure and it came back at  120/76. We then checked his blood pressure with his home cuff which was 135/76. There may be a slight discrepancy with a favor to higher blood pressure of his home cuff. In general I feel that his blood pressure control is very good and may need to be tweaked somewhat.  01/09/2017  Alexander [name] was seen today in follow-up. Overall he feels well. He denies a chest pain or worsening shortness of breath. He has had interim improvement in LVEF back to normal. His blood pressure has been better controlled. Today's 135/78. He's working with his nephrologist. Creatinine is recently 2.2. He has been complaining of some heaviness in his leg when he walks. Is not necessarily crampy pain or burning. It comes and goes with exertion without. He does have a history of some peripheral neuropathy and was supposed to be on gabapentin but has not taken it due to some fear of the medication. He is on a atorvastatin. His most recent lipid profile shows an LDL-C of 81, but previously he was well controlled at 61.  04/16/2018  Alexander [name] returns today for follow-up.  He is doing well.  He denies chest pain or any shortness of breath.  EKG shows sinus rhythm with nonspecific T wave changes.  Blood pressure is 134/80.  07/14/2019  Alexander [name] returns today for follow-up.  Overall he is doing well though blood pressure was noted to be elevated today.  Recent labs showed LDL 81  hemoglobin A1c of 6.8 and creatinine had increased from 2.3-2.74.  He is working with a nephrologist Dr. Loanne Drilling on his diabetes.  EKG shows sinus rhythm with some nonspecific T wave changes.  06/15/2020  Alexander [name] is seen today in follow-up.  He continues to do well.  Blood pressure is now better controlled.  He recently saw his nephrologist who noted that he also had good blood pressure control and that his renal function was fairly stable.  He denies any worsening shortness of breath or chest pain.  He had started on Trulicity after some recent  weight gain and increase in A1c however that has improved and he seems to be doing better.  He has not had recent lipid testing since January of this year.  12/19/2021  Alexander [name] returns today for follow-up.  Overall he remains asymptomatic.  Nuys shortness of breath or chest pain.  He is less active than he has been.  Recently his A1c is climbed somewhat.  Blood pressure was elevated today however generally been in the 1 06-2 30 systolic.  Cholesterol recently was well controlled with total 149, HDL 45, triglycerides 80 and LDL 87.  As a diabetic his target LDL is less than 70 however I suspect he could reach that target with more aggressive diet.  He is on 40 of atorvastatin.  He has no known coronary disease.  He has had no recurrence of any heart failure and EF normalized by echo number of years ago.  He is noted to have a low atrial or ectopic atrial rhythm today on EKG.  He does have an Apple Watch and can monitor heart rate with exercise.  Past Medical History:  Diagnosis Date   CHEST PAIN 08/28/2009   DIABETES MELLITUS, TYPE II 03/20/2007   HYPERCHOLESTEROLEMIA 07/21/2008   HYPERTENSION 03/20/2007   Hypertensive urgency 03/27/2015   Left ventricular dysfunction 03/28/2015   EF 40-45%, no WMA, grade 2 diastolic dysfunction   Renal disorder     Past Surgical History:  Procedure Laterality Date   KNEE ARTHROSCOPY Right     Current Outpatient Prescriptions  Medication Sig Dispense Refill   aspirin EC 81 MG EC tablet Take 1 tablet (81 mg total) by mouth daily.     atorvastatin (LIPITOR) 40 MG tablet Take 1 tablet (40 mg total) by mouth daily at 6 PM. 90 tablet 3   carvedilol (COREG) 6.25 MG tablet Take 1 tablet (6.25 mg total) by mouth 2 (two) times daily with a meal. 60 tablet 5   hydrALAZINE (APRESOLINE) 100 MG tablet Take 1 tablet (100 mg total) by mouth every 8 (eight) hours. 90 tablet 5   insulin detemir (LEVEMIR) 100 UNIT/ML injection Inject 2 mLs (200 Units total) into the skin  every morning. (Patient taking differently: Inject 200 Units into the skin every morning. Patient states he is taking 200 units daily per patient) 70 mL 11   lisinopril (PRINIVIL,ZESTRIL) 20 MG tablet Take 1 tablet (20 mg total) by mouth daily. 90 tablet 3   nitroGLYCERIN (NITROSTAT) 0.4 MG SL tablet Place 1 tablet (0.4 mg total) under the tongue every 5 (five) minutes x 3 doses as needed for chest pain. Don't take within 24 hrs of Cialis 25 tablet 3   tadalafil (CIALIS) 20 MG tablet Take 1 tablet (20 mg total) by mouth daily as needed for erectile dysfunction. 10 tablet 11   No current facility-administered medications for this visit.    Allergies:   Patient has no known allergies.  Social History:  The patient  reports that he has never smoked. He has never used smokeless tobacco. He reports that he does not drink alcohol and does not use drugs.   Family History:  The patient's family history includes Diabetes in his brother, father, maternal grandfather, mother, and paternal grandfather; Hypertension in his brother and father.    ROS:  Please see the history of present illness. All other systems are reviewed and negative.    PHYSICAL EXAM: VS:  BP (!) 150/78   Pulse 74   Ht '6\' 3"'$  (1.905 m)   Wt 254 lb 3.2 oz (115.3 kg)   SpO2 94%   BMI 31.77 kg/m  , BMI Body mass index is 31.77 kg/m. General appearance: alert and no distress Neck: no carotid bruit, no JVD and thyroid not enlarged, symmetric, no tenderness/mass/nodules Lungs: clear to auscultation bilaterally Heart: regular rate and rhythm, S1, S2 normal, no murmur, click, rub or gallop Abdomen: soft, non-tender; bowel sounds normal; no masses,  no organomegaly Extremities: extremities normal, atraumatic, no cyanosis or edema Pulses: 2+ and symmetric Skin: Skin color, texture, turgor normal. No rashes or lesions Neurologic: Grossly normal Psych: Pleasant  EKG:   Ectopic atrial rhythm at 74 and personally reviewed  Recent  Labs: 11/30/2021: BUN 40; Creatinine, Ser 2.73; Hemoglobin 12.8; Platelets 281.0; Potassium 4.7; Sodium 141; TSH 0.92    Lipid Panel    Component Value Date/Time   CHOL 149 11/30/2021 1151   CHOL 152 07/27/2019 0823   TRIG 80.0 11/30/2021 1151   HDL 45.50 11/30/2021 1151   HDL 47 07/27/2019 0823   CHOLHDL 3 11/30/2021 1151   VLDL 16.0 11/30/2021 1151   LDLCALC 87 11/30/2021 1151   LDLCALC 88 07/27/2019 0823   LDLDIRECT 164.0 07/15/2014 1703     Wt Readings from Last 3 Encounters:  12/19/21 254 lb 3.2 oz (115.3 kg)  11/30/21 253 lb (114.8 kg)  10/26/21 250 lb (113.4 kg)     ASSESSMENT:  Type 2 diabetes on insulin Labile hypertension Recent systolic congestive heart failure with improvement in LVEF to 55-60% 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  Blood sugars have been elevated somewhat recently.  Weight is stable if not slightly up.  His cholesterol remains above target LDL less than 70.  He is on high intensity statin.  He will need to continue to work with diet and exercise on this otherwise we may need to consider adding another agent.  He is noted to have an ectopic atrial rhythm today but heart rate in the 70s.  He is on carvedilol.  I advised him to monitor this with exercise and if he is not noted 15 to 20 bpm increase in heart rate with exercise or he feels that he is symptomatic with fatigue during exercise then we may need to consider decreasing his beta-blocker.  Plan otherwise follow-up with me annually or sooner as necessary.  [name], MD, [facility], [place] Director of the [facility] Lipid Disorders &  Cardiovascular Risk Reduction Clinic Diplomate of the American Board of Clinical Lipidology Attending Cardiologist  Direct Dial: [phone]  Fax: [phone]   Website:  [email or web address]   [name], MD  12/19/2021 8:59 AM

## 2021-12-19 NOTE — Patient Instructions (Signed)
Medication Instructions:  NO CHANGES  *If you need a refill on your cardiac medications before your next appointment, please call your pharmacy*   Follow-Up: At [facility], you and your health needs are our priority.  As part of our continuing mission to provide you with exceptional heart care, we have created designated Provider Care Teams.  These Care Teams include your primary Cardiologist (physician) and Advanced Practice Providers (APPs -  Physician Assistants and Nurse Practitioners) who all work together to provide you with the care you need, when you need it.  We recommend signing up for the patient portal called "MyChart".  Sign up information is provided on this After Visit Summary.  MyChart is used to connect with patients for Virtual Visits (Telemedicine).  Patients are able to view lab/test results, encounter notes, upcoming appointments, etc.  Non-urgent messages can be sent to your provider as well.   To learn more about what you can do with MyChart, go to [email or web address].    Your next appointment:   12 month(s)  The format for your next appointment:   In Person  Provider:   [name], MD {  Other Instructions Monitor your HR at home, while exercising -- if your HR does not go up over 20 bpm with exercise or over 100 bpm let us know

## 2021-12-25 ENCOUNTER — Encounter: Payer: Self-pay | Admitting: Physician Assistant

## 2021-12-25 ENCOUNTER — Ambulatory Visit: Payer: 59 | Admitting: Physician Assistant

## 2021-12-25 VITALS — BP 120/90 | HR 82 | Ht 75.0 in | Wt 250.0 lb

## 2021-12-25 DIAGNOSIS — K219 Gastro-esophageal reflux disease without esophagitis: Secondary | ICD-10-CM | POA: Diagnosis not present

## 2021-12-25 DIAGNOSIS — Z1211 Encounter for screening for malignant neoplasm of colon: Secondary | ICD-10-CM

## 2021-12-25 DIAGNOSIS — Z800 Family history of malignant neoplasm of digestive organs: Secondary | ICD-10-CM

## 2021-12-25 DIAGNOSIS — Z1212 Encounter for screening for malignant neoplasm of rectum: Secondary | ICD-10-CM

## 2021-12-25 MED ORDER — NA SULFATE-K SULFATE-MG SULF 17.5-3.13-1.6 GM/177ML PO SOLN: 1.0000 | Freq: Once | ORAL | 0 refills | Status: AC

## 2021-12-25 NOTE — Patient Instructions (Addendum)
If you are age 50 or older, your body mass index should be between 23-30. Your Body mass index is 31.25 kg/m. If this is out of the aforementioned range listed, please consider follow up with your Primary Care Provider.  If you are age 94 or younger, your body mass index should be between 19-25. Your Body mass index is 31.25 kg/m. If this is out of the aformentioned range listed, please consider follow up with your Primary Care Provider.   ________________________________________________________  The [facility] providers would like to encourage you to use [facility] to communicate with providers for non-urgent requests or questions.  Due to long hold times on the telephone, sending your provider a message by [facility] may be a faster and more efficient way to get a response.  Please allow 48 business hours for a response.  Please remember that this is for non-urgent requests.  _______________________________________________________  Dennis Bast have been scheduled for a colonoscopy. Please follow written instructions given to you at your visit today.  Please pick up your prep supplies at the pharmacy within the next 1-3 days. If you use inhalers (even only as needed), please bring them with you on the day of your procedure.

## 2021-12-25 NOTE — Progress Notes (Signed)
Attending Physician's Attestation   I have reviewed the chart.   I agree with the Advanced Practitioner's note, impression, and recommendations with any updates as below.    Anneka Studer Mansouraty, MD [facility] Advanced [facility]oscopy Office # [phone]  

## 2021-12-25 NOTE — Progress Notes (Signed)
Chief Complaint: Discuss colonoscopy  HPI:    Alexander [name] is a 51 year old African-American male with a past medical history as listed below including diabetes, CKD and hypertension (07/13/2015 echo with LVEF 55-60%), who was referred to me by Ailene Ards, NP for discussion of a screening colonoscopy.    11/26/2021 CMP with a creatinine of 2.68, BUN 34, glucose 222 and otherwise normal.  CBC with a minimally decreased hemoglobin at 12.7, normal MCV and otherwise normal.    Today, the patient tells me that he is doing well.  He really has no GI complaints or concerns other than some occasional reflux symptoms which he has been experiencing over the past month or so which are helped by as needed Famotidine 20 mg which he takes maybe twice a week.  Denies any change in bowel habits or abdominal pain.    Does describe a family history of colon cancer in his grandfather diagnosed in his 58s.    Denies fever, chills, weight loss, blood in stool, change in bowel habits or symptoms that awaken him from sleep.  Past Medical History:  Diagnosis Date   CHEST PAIN 08/28/2009   DIABETES MELLITUS, TYPE II 03/20/2007   HYPERCHOLESTEROLEMIA 07/21/2008   HYPERTENSION 03/20/2007   Hypertensive urgency 03/27/2015   Left ventricular dysfunction 03/28/2015   EF 40-45%, no WMA, grade 2 diastolic dysfunction   Renal disorder     Past Surgical History:  Procedure Laterality Date   KNEE ARTHROSCOPY Right     Current Outpatient Medications  Medication Sig Dispense Refill   aspirin EC 81 MG EC tablet Take 1 tablet (81 mg total) by mouth daily.     atorvastatin (LIPITOR) 40 MG tablet TAKE 1 TABLET (40 MG TOTAL) BY MOUTH DAILY. KEEP UPCOMING APPOINTMENT FOR FUTURE REFILLS. 90 tablet 0   BD PEN NEEDLE NANO U/F 32G X 4 MM MISC USE DAILY AS DIRECTED 100 each 3   carvedilol (COREG) 12.5 MG tablet TAKE 1 TABLET (12.5 MG TOTAL) BY MOUTH 2 (TWO) TIMES DAILY WITH A MEAL. 180 tablet 1   Dulaglutide (TRULICITY) 4.5  HF/0.2OV SOPN Inject 4.5 mg as directed once a week. 6 mL 3   furosemide (LASIX) 20 MG tablet TAKE 1 TABLET (20 MG TOTAL) BY MOUTH DAILY AS NEEDED (FOR SHORTNESS OF BREATH AND SWELLING.).     glucose blood (FREESTYLE LITE) test strip 1 each by Other route 2 (two) times daily. And lancets 2/day 200 each 3   hydrALAZINE (APRESOLINE) 50 MG tablet Take 1 tablet by mouth 2 (two) times daily.     insulin detemir (LEVEMIR FLEXTOUCH) 100 UNIT/ML FlexPen Inject 28 Units into the skin every morning. And pen needles 1/day 30 mL 3   Lancets (FREESTYLE) lancets Use to monitor glucose levels BID; E11.22 100 each 12   nitroGLYCERIN (NITROSTAT) 0.4 MG SL tablet Place 1 tablet (0.4 mg total) under the tongue every 5 (five) minutes x 3 doses as needed for chest pain. Don't take within 24 hrs of Cialis 25 tablet 0   Vitamin D, Ergocalciferol, (DRISDOL) 1.25 MG (50000 UNIT) CAPS capsule Take 1 capsule (50,000 Units total) by mouth every Monday. 5 capsule 2   No current facility-administered medications for this visit.    Allergies as of 12/25/2021   (No Known Allergies)    Family History  Problem Relation Age of Onset   Diabetes Mother    Diabetes Father    Hypertension Father    Diabetes Maternal Grandfather    Diabetes  Paternal Grandfather    Diabetes Brother    Hypertension Brother     Social History   Socioeconomic History   Marital status: Married    Spouse name: Not on file   Number of children: Not on file   Years of education: Not on file   Highest education level: Not on file  Occupational History   Occupation: Training and development officer: [employer]  Tobacco Use   Smoking status: Never   Smokeless tobacco: Never  Substance and Sexual Activity   Alcohol use: No   Drug use: No   Sexual activity: Not on file  Other Topics Concern   Not on file  Social History Narrative   Not on file   Social Determinants of Health   Financial Resource Strain: Not on file  Food Insecurity: Not on file   Transportation Needs: Not on file  Physical Activity: Not on file  Stress: Not on file  Social Connections: Not on file  Intimate Partner Violence: Not on file    Review of Systems:    Constitutional: No weight loss, fever or chills Skin: No rash  Cardiovascular: No chest pain Respiratory: No SOB  Gastrointestinal: See HPI and otherwise negative Genitourinary: No dysuria Neurological: No headache, dizziness or syncope Musculoskeletal: No new muscle or joint pain Hematologic: No bleeding  Psychiatric: No history of depression or anxiety   Physical Exam:  Vital signs: BP 120/90   Pulse 82   Ht '6\' 3"'$  (1.905 m)   Wt 250 lb (113.4 kg)   BMI 31.25 kg/m   Constitutional:   Pleasant AA male appears to be in NAD, Well developed, Well nourished, alert and cooperative Head:  Normocephalic and atraumatic. Eyes:   PEERL, EOMI. No icterus. Conjunctiva pink. Ears:  Normal auditory acuity. Neck:  Supple Throat: Oral cavity and pharynx without inflammation, swelling or lesion.  Respiratory: Respirations even and unlabored. Lungs clear to auscultation bilaterally.   No wheezes, crackles, or rhonchi.  Cardiovascular: Normal S1, S2. No MRG. Regular rate and rhythm. No peripheral edema, cyanosis or pallor.  Gastrointestinal:  Soft, nondistended, nontender. No rebound or guarding. Normal bowel sounds. No appreciable masses or hepatomegaly. Rectal:  Not performed.  Msk:  Symmetrical without gross deformities. Without edema, no deformity or joint abnormality.  Neurologic:  Alert and  oriented x4;  grossly normal neurologically.  Skin:   Dry and intact without significant lesions or rashes. Psychiatric: Demonstrates good judgement and reason without abnormal affect or behaviors.  See HPI for recent labs.  Assessment: 1.  Screening for colorectal cancer: Patient has never had screening for colon cancer 2.  GERD: Very occasional symptoms controlled with as needed Famotidine 20 mg 1-2 times a  week; likely mild gastritis 3.  Family history of colon cancer: In his grandfather in his 46s  Plan: 1.  Scheduled patient for screening colonoscopy in the [facility] with Dr. Rush Landmark.  Did provide the patient a detailed list of risks for the procedure and he agrees to proceed. Patient is appropriate for endoscopic procedure(s) in the ambulatory ([facility]) setting.  2.  Patient to continue Famotidine 20 mg as needed for reflux 3.  Did discuss with patient that if his reflux becomes more consistent or he is requiring more medications for this then at some point in the future he may need an EGD at least once for evaluation. 4.  Patient to follow in clinic per recommendations of Dr. Rush Landmark after time of procedure.  Ellouise Newer, PA-C [facility] 12/25/2021,  9:01 AM  Cc: Ailene Ards, NP

## 2022-01-15 ENCOUNTER — Ambulatory Visit: Payer: 59 | Admitting: Internal Medicine

## 2022-02-13 ENCOUNTER — Encounter: Payer: Self-pay | Admitting: Gastroenterology

## 2022-02-13 ENCOUNTER — Ambulatory Visit (AMBULATORY_SURGERY_CENTER): Payer: 59 | Admitting: Gastroenterology

## 2022-02-13 VITALS — BP 150/83 | HR 72 | Temp 96.8°F | Resp 15 | Ht 75.0 in | Wt 250.0 lb

## 2022-02-13 DIAGNOSIS — D122 Benign neoplasm of ascending colon: Secondary | ICD-10-CM

## 2022-02-13 DIAGNOSIS — Z1211 Encounter for screening for malignant neoplasm of colon: Secondary | ICD-10-CM

## 2022-02-13 DIAGNOSIS — Z1212 Encounter for screening for malignant neoplasm of rectum: Secondary | ICD-10-CM

## 2022-02-13 MED ORDER — SODIUM CHLORIDE 0.9 % IV SOLN: 500.0000 mL | Freq: Once | INTRAVENOUS | Status: DC

## 2022-02-13 NOTE — Progress Notes (Signed)
Report to PACU, RN, vss, BBS= Clear.  

## 2022-02-13 NOTE — Progress Notes (Signed)
GASTROENTEROLOGY PROCEDURE H&P NOTE   Primary Care Physician: Hoyt Koch, MD  HPI: Alexander [name] is a 51 y.o. male who presents for Colonoscopy for screening.  Past Medical History:  Diagnosis Date   CHEST PAIN 08/28/2009   DIABETES MELLITUS, TYPE II 03/20/2007   HYPERCHOLESTEROLEMIA 07/21/2008   HYPERTENSION 03/20/2007   Hypertensive urgency 03/27/2015   Left ventricular dysfunction 03/28/2015   EF 40-45%, no WMA, grade 2 diastolic dysfunction   Renal disorder    Past Surgical History:  Procedure Laterality Date   KNEE ARTHROSCOPY Right    Current Outpatient Medications  Medication Sig Dispense Refill   aspirin EC 81 MG EC tablet Take 1 tablet (81 mg total) by mouth daily.     atorvastatin (LIPITOR) 40 MG tablet TAKE 1 TABLET (40 MG TOTAL) BY MOUTH DAILY. KEEP UPCOMING APPOINTMENT FOR FUTURE REFILLS. 90 tablet 0   BD PEN NEEDLE NANO U/F 32G X 4 MM MISC USE DAILY AS DIRECTED 100 each 3   carvedilol (COREG) 12.5 MG tablet TAKE 1 TABLET (12.5 MG TOTAL) BY MOUTH 2 (TWO) TIMES DAILY WITH A MEAL. 180 tablet 1   Dulaglutide (TRULICITY) 4.5 NA/3.5TD SOPN Inject 4.5 mg as directed once a week. 6 mL 3   glucose blood (FREESTYLE LITE) test strip 1 each by Other route 2 (two) times daily. And lancets 2/day 200 each 3   hydrALAZINE (APRESOLINE) 50 MG tablet Take 1 tablet by mouth 2 (two) times daily.     insulin detemir (LEVEMIR FLEXTOUCH) 100 UNIT/ML FlexPen Inject 28 Units into the skin every morning. And pen needles 1/day 30 mL 3   Lancets (FREESTYLE) lancets Use to monitor glucose levels BID; E11.22 100 each 12   Vitamin D, Ergocalciferol, (DRISDOL) 1.25 MG (50000 UNIT) CAPS capsule Take 1 capsule (50,000 Units total) by mouth every Monday. 5 capsule 2   furosemide (LASIX) 20 MG tablet TAKE 1 TABLET (20 MG TOTAL) BY MOUTH DAILY AS NEEDED (FOR SHORTNESS OF BREATH AND SWELLING.).     nitroGLYCERIN (NITROSTAT) 0.4 MG SL tablet Place 1 tablet (0.4 mg total) under the tongue  every 5 (five) minutes x 3 doses as needed for chest pain. Don't take within 24 hrs of Cialis 25 tablet 0   Current Facility-Administered Medications  Medication Dose Route Frequency Provider Last Rate Last Admin   0.9 %  sodium chloride infusion  500 mL Intravenous Once Mansouraty, Telford Nab., MD        Current Outpatient Medications:    aspirin EC 81 MG EC tablet, Take 1 tablet (81 mg total) by mouth daily., Disp: , Rfl:    atorvastatin (LIPITOR) 40 MG tablet, TAKE 1 TABLET (40 MG TOTAL) BY MOUTH DAILY. KEEP UPCOMING APPOINTMENT FOR FUTURE REFILLS., Disp: 90 tablet, Rfl: 0   BD PEN NEEDLE NANO U/F 32G X 4 MM MISC, USE DAILY AS DIRECTED, Disp: 100 each, Rfl: 3   carvedilol (COREG) 12.5 MG tablet, TAKE 1 TABLET (12.5 MG TOTAL) BY MOUTH 2 (TWO) TIMES DAILY WITH A MEAL., Disp: 180 tablet, Rfl: 1   Dulaglutide (TRULICITY) 4.5 DU/2.0UR SOPN, Inject 4.5 mg as directed once a week., Disp: 6 mL, Rfl: 3   glucose blood (FREESTYLE LITE) test strip, 1 each by Other route 2 (two) times daily. And lancets 2/day, Disp: 200 each, Rfl: 3   hydrALAZINE (APRESOLINE) 50 MG tablet, Take 1 tablet by mouth 2 (two) times daily., Disp: , Rfl:    insulin detemir (LEVEMIR FLEXTOUCH) 100 UNIT/ML FlexPen, Inject 28  Units into the skin every morning. And pen needles 1/day, Disp: 30 mL, Rfl: 3   Lancets (FREESTYLE) lancets, Use to monitor glucose levels BID; E11.22, Disp: 100 each, Rfl: 12   Vitamin D, Ergocalciferol, (DRISDOL) 1.25 MG (50000 UNIT) CAPS capsule, Take 1 capsule (50,000 Units total) by mouth every Monday., Disp: 5 capsule, Rfl: 2   furosemide (LASIX) 20 MG tablet, TAKE 1 TABLET (20 MG TOTAL) BY MOUTH DAILY AS NEEDED (FOR SHORTNESS OF BREATH AND SWELLING.)., Disp: , Rfl:    nitroGLYCERIN (NITROSTAT) 0.4 MG SL tablet, Place 1 tablet (0.4 mg total) under the tongue every 5 (five) minutes x 3 doses as needed for chest pain. Don't take within 24 hrs of Cialis, Disp: 25 tablet, Rfl: 0  Current  Facility-Administered Medications:    0.9 %  sodium chloride infusion, 500 mL, Intravenous, Once, Mansouraty, Telford Nab., MD No Known Allergies Family History  Problem Relation Age of Onset   Diabetes Mother    Diabetes Father    Hypertension Father    Diabetes Maternal Grandfather    Diabetes Paternal Grandfather    Diabetes Brother    Hypertension Brother    Social History   Socioeconomic History   Marital status: Married    Spouse name: Not on file   Number of children: Not on file   Years of education: Not on file   Highest education level: Not on file  Occupational History   Occupation: Training and development officer: [employer]  Tobacco Use   Smoking status: Never   Smokeless tobacco: Never  Vaping Use   Vaping Use: Never used  Substance and Sexual Activity   Alcohol use: No   Drug use: No   Sexual activity: Not on file  Other Topics Concern   Not on file  Social History Narrative   Not on file   Social Determinants of Health   Financial Resource Strain: Not on file  Food Insecurity: Not on file  Transportation Needs: Not on file  Physical Activity: Not on file  Stress: Not on file  Social Connections: Not on file  Intimate Partner Violence: Not on file    Physical Exam: Today's Vitals   02/13/22 0751  BP: 119/63  Pulse: 84  Temp: (!) 96.8 F (36 C)  TempSrc: Temporal  SpO2: 98%  Weight: 250 lb (113.4 kg)  Height: '6\' 3"'$  (1.905 m)  PainSc: 0-No pain   Body mass index is 31.25 kg/m. GEN: NAD EYE: Sclerae anicteric ENT: MMM CV: Non-tachycardic GI: Soft, NT/ND NEURO:  Alert & Oriented x 3  Lab Results: No results for input(s): "WBC", "HGB", "HCT", "PLT" in the last 72 hours. BMET No results for input(s): "NA", "K", "CL", "CO2", "GLUCOSE", "BUN", "CREATININE", "CALCIUM" in the last 72 hours. LFT No results for input(s): "PROT", "ALBUMIN", "AST", "ALT", "ALKPHOS", "BILITOT", "BILIDIR", "IBILI" in the last 72 hours. PT/INR No results for input(s):  "LABPROT", "INR" in the last 72 hours.   Impression / Plan: This is a 51 y.o.male who presents for Colonoscopy for screening.  The risks and benefits of endoscopic evaluation/treatment were discussed with the patient and/or family; these include but are not limited to the risk of perforation, infection, bleeding, missed lesions, lack of diagnosis, severe illness requiring hospitalization, as well as anesthesia and sedation related illnesses.  The patient's history has been reviewed, patient examined, no change in status, and deemed stable for procedure.  The patient and/or family is agreeable to proceed.    Justice Britain, MD [facility]oscopy Office # [phone]

## 2022-02-13 NOTE — Progress Notes (Signed)
Pt's states no medical or surgical changes since previsit or office visit.   Vitals-AS

## 2022-02-13 NOTE — Progress Notes (Signed)
Called to room to assist during endoscopic procedure.  Patient ID and intended procedure confirmed with present staff. Received instructions for my participation in the procedure from the performing physician.  

## 2022-02-13 NOTE — Patient Instructions (Signed)
Handouts on hemorrhoids, polyps, and high fiber diet given to patient.  Await pathology results. Recommended to use FiberCon 1-2 tablets daily. Resume previous diet and continue present medications. Repeat colonoscopy in 3 years for surveillance.   YOU HAD AN ENDOSCOPIC PROCEDURE TODAY AT [facility]:   Refer to the procedure report that was given to you for any specific questions about what was found during the examination.  If the procedure report does not answer your questions, please call your gastroenterologist to clarify.  If you requested that your care partner not be given the details of your procedure findings, then the procedure report has been included in a sealed envelope for you to review at your convenience later.  YOU SHOULD EXPECT: Some feelings of bloating in the abdomen. Passage of more gas than usual.  Walking can help get rid of the air that was put into your GI tract during the procedure and reduce the bloating. If you had a lower endoscopy (such as a colonoscopy or flexible sigmoidoscopy) you may notice spotting of blood in your stool or on the toilet paper. If you underwent a bowel prep for your procedure, you may not have a normal bowel movement for a few days.  Please Note:  You might notice some irritation and congestion in your nose or some drainage.  This is from the oxygen used during your procedure.  There is no need for concern and it should clear up in a day or so.  SYMPTOMS TO REPORT IMMEDIATELY:  Following lower endoscopy (colonoscopy or flexible sigmoidoscopy):  Excessive amounts of blood in the stool  Significant tenderness or worsening of abdominal pains  Swelling of the abdomen that is new, acute  Fever of 100F or higher   For urgent or emergent issues, a gastroenterologist can be reached at any hour by calling [phone]. Do not use MyChart messaging for urgent concerns.    DIET:  We do recommend a small meal at first, but then  you may proceed to your regular diet.  Drink plenty of fluids but you should avoid alcoholic beverages for 24 hours.  ACTIVITY:  You should plan to take it easy for the rest of today and you should NOT DRIVE or use heavy machinery until tomorrow (because of the sedation medicines used during the test).    FOLLOW UP: Our staff will call the number listed on your records the next business day following your procedure.  We will call around 7:15- 8:00 am to check on you and address any questions or concerns that you may have regarding the information given to you following your procedure. If we do not reach you, we will leave a message.  If you develop any symptoms (ie: fever, flu-like symptoms, shortness of breath, cough etc.) before then, please call [phone].  If you test positive for Covid 19 in the 2 weeks post procedure, please call and report this information to [place].    If any biopsies were taken you will be contacted by phone or by letter within the next 1-3 weeks.  Please call us at [phone] if you have not heard about the biopsies in 3 weeks.    SIGNATURES/CONFIDENTIALITY: You and/or your care partner have signed paperwork which will be entered into your electronic medical record.  These signatures attest to the fact that that the information above on your After Visit Summary has been reviewed and is understood.  Full responsibility of the confidentiality of this discharge information lies  with you and/or your care-partner.  

## 2022-02-13 NOTE — Op Note (Signed)
[facility] Patient Name: Alexander [name] Procedure Date: 02/13/2022 8:29 AM MRN: [id number] Endoscopist: Justice Britain , MD Age: 51 Referring MD:  Date of Birth: [birth year] Gender: Male Account #: [id number] Procedure:                Colonoscopy Indications:              Screening for colorectal malignant neoplasm, This                            is the patient's first colonoscopy Medicines:                Monitored Anesthesia Care Procedure:                Pre-Anesthesia Assessment:                           - Prior to the procedure, a History and Physical                            was performed, and patient medications and                            allergies were reviewed. The patient's tolerance of                            previous anesthesia was also reviewed. The risks                            and benefits of the procedure and the sedation                            options and risks were discussed with the patient.                            All questions were answered, and informed consent                            was obtained. Prior Anticoagulants: The patient has                            taken no previous anticoagulant or antiplatelet                            agents except for aspirin. ASA Grade Assessment:                            III - A patient with severe systemic disease. After                            reviewing the risks and benefits, the patient was                            deemed in satisfactory condition to undergo the  procedure.                           After obtaining informed consent, the colonoscope                            was passed under direct vision. Throughout the                            procedure, the patient's blood pressure, pulse, and                            oxygen saturations were monitored continuously. The                            Colonoscope was introduced through the anus and                             advanced to the 3 cm into the ileum. The                            colonoscopy was somewhat difficult due to a                            redundant colon, significant looping and a tortuous                            colon. Successful completion of the procedure was                            aided by changing the patient's position, using                            manual pressure, straightening and shortening the                            scope to obtain bowel loop reduction and using                            scope torsion. The patient tolerated the procedure.                            The quality of the bowel preparation was adequate.                            The terminal ileum, ileocecal valve, appendiceal                            orifice, and rectum were photographed. Scope In: 8:46:06 AM Scope Out: 9:00:49 AM Scope Withdrawal Time: 0 hours 7 minutes 54 seconds  Total Procedure Duration: 0 hours 14 minutes 43 seconds  Findings:                 The digital rectal exam findings include  hemorrhoids. Pertinent negatives include no                            palpable rectal lesions.                           The colon (entire examined portion) revealed                            significantly excessive looping due to redundancy                            and tortuosity.                           The terminal ileum and ileocecal valve appeared                            normal.                           A 13 mm polyp was found in the ascending colon. The                            polyp was semi-sessile. The polyp was removed with                            a cold snare. Resection and retrieval were complete.                           Normal mucosa was found in the entire colon                            otherwise.                           Non-bleeding non-thrombosed internal hemorrhoids                            were found  during retroflexion, during perianal                            exam and during digital exam. The hemorrhoids were                            Grade I (internal hemorrhoids that do not prolapse). Complications:            No immediate complications. Estimated Blood Loss:     Estimated blood loss was minimal. Impression:               - Hemorrhoids found on digital rectal exam.                           - There was significant looping of the colon due to  redundancy and tortuosity.                           - The examined portion of the ileum was normal.                           - One 13 mm polyp in the ascending colon, removed                            with a cold snare. Resected and retrieved.                           - Normal mucosa in the entire examined colon                            otherwise.                           - Non-bleeding non-thrombosed internal hemorrhoids. Recommendation:           - The patient will be observed post-procedure,                            until all discharge criteria are met.                           - Discharge patient to home.                           - Patient has a contact number available for                            emergencies. The signs and symptoms of potential                            delayed complications were discussed with the                            patient. Return to normal activities tomorrow.                            Written discharge instructions were provided to the                            patient.                           - High fiber diet.                           - Use FiberCon 1-2 tablets PO daily.                           - Continue present medications.                           - Await  pathology results.                           - Repeat colonoscopy in 3 years for surveillance.                           - The findings and recommendations were discussed                             with the patient.                           - The findings and recommendations were discussed                            with the patient's family. Justice Britain, MD 02/13/2022 9:05:51 AM

## 2022-02-14 ENCOUNTER — Telehealth: Payer: Self-pay | Admitting: *Deleted

## 2022-02-14 NOTE — Telephone Encounter (Signed)
  Follow up Call-     02/13/2022    7:52 AM  Call back number  Post procedure Call Back phone  # [phone]  Permission to leave phone message Yes     Patient questions:  Do you have a fever, pain , or abdominal swelling? No. Pain Score  0 *  Have you tolerated food without any problems? Yes.    Have you been able to return to your normal activities? Yes.    Do you have any questions about your discharge instructions: Diet   No. Medications  No. Follow up visit  No.  Do you have questions or concerns about your Care? No.  Actions: * If pain score is 4 or above: No action needed, pain <4.

## 2022-02-15 ENCOUNTER — Encounter: Payer: Self-pay | Admitting: Gastroenterology

## 2022-02-22 ENCOUNTER — Encounter: Payer: Self-pay | Admitting: Family Medicine

## 2022-02-22 ENCOUNTER — Ambulatory Visit: Payer: 59 | Admitting: Family Medicine

## 2022-02-22 ENCOUNTER — Ambulatory Visit (INDEPENDENT_AMBULATORY_CARE_PROVIDER_SITE_OTHER): Payer: 59

## 2022-02-22 VITALS — BP 146/94 | HR 81 | Temp 97.9°F | Ht 75.0 in | Wt 250.0 lb

## 2022-02-22 DIAGNOSIS — I110 Hypertensive heart disease with heart failure: Secondary | ICD-10-CM | POA: Diagnosis not present

## 2022-02-22 DIAGNOSIS — M5442 Lumbago with sciatica, left side: Secondary | ICD-10-CM

## 2022-02-22 MED ORDER — TRAMADOL HCL 50 MG PO TABS: 50.0000 mg | ORAL_TABLET | Freq: Three times a day (TID) | ORAL | 0 refills | Status: AC | PRN

## 2022-02-22 MED ORDER — FUROSEMIDE 20 MG PO TABS
ORAL_TABLET | ORAL | 0 refills | Status: AC
Start: 2022-02-22 — End: ?

## 2022-02-22 NOTE — Progress Notes (Signed)
Subjective:     Patient ID: Alexander [name], male    DOB: 05/13/1971, 51 y.o.   MRN: [id number]  Chief Complaint  Patient presents with  . Leg Pain    Left leg pain for about 2-3 weeks. Starts at left lower back and radiates down left side of leg. Shooting pain when standing up from sitting down (for any length of time)    HPI Patient is in today for a 2-3 wk hx of left low back pain that radiates down his left leg. Pain is shooting. Worse with standing up and walking or sitting in certain positions.   Denies fever, chills, dizziness, chest pain, palpitations, shortness of breath, abdominal pain, N/V/D, urinary symptoms, LE edema.   Takes Lasix as needed.    Health Maintenance Due  Topic Date Due  . Diabetic kidney evaluation - Urine ACR  10/28/2015  . COVID-19 Vaccine (2 - Pfizer series) 11/28/2020  . Zoster Vaccines- Shingrix (1 of 2) Never done  . INFLUENZA VACCINE  02/05/2022  . FOOT EXAM  02/20/2022    Past Medical History:  Diagnosis Date  . CHEST PAIN 08/28/2009  . DIABETES MELLITUS, TYPE II 03/20/2007  . HYPERCHOLESTEROLEMIA 07/21/2008  . HYPERTENSION 03/20/2007  . Hypertensive urgency 03/27/2015  . Left ventricular dysfunction 03/28/2015   EF 40-45%, no WMA, grade 2 diastolic dysfunction  . Renal disorder     Past Surgical History:  Procedure Laterality Date  . KNEE ARTHROSCOPY Right     Family History  Problem Relation Age of Onset  . Diabetes Mother   . Diabetes Father   . Hypertension Father   . Diabetes Maternal Grandfather   . Diabetes Paternal Grandfather   . Diabetes Brother   . Hypertension Brother     Social History   Socioeconomic History  . Marital status: Married    Spouse name: Not on file  . Number of children: Not on file  . Years of education: Not on file  . Highest education level: Not on file  Occupational History  . Occupation: Training and development officer: [employer]  Tobacco Use  . Smoking status: Never  . Smokeless tobacco: Never   Vaping Use  . Vaping Use: Never used  Substance and Sexual Activity  . Alcohol use: No  . Drug use: No  . Sexual activity: Not on file  Other Topics Concern  . Not on file  Social History Narrative  . Not on file   Social Determinants of Health   Financial Resource Strain: Not on file  Food Insecurity: Not on file  Transportation Needs: Not on file  Physical Activity: Not on file  Stress: Not on file  Social Connections: Not on file  Intimate Partner Violence: Not on file    Outpatient Medications Prior to Visit  Medication Sig Dispense Refill  . aspirin EC 81 MG EC tablet Take 1 tablet (81 mg total) by mouth daily.    Marland Kitchen atorvastatin (LIPITOR) 40 MG tablet TAKE 1 TABLET (40 MG TOTAL) BY MOUTH DAILY. KEEP UPCOMING APPOINTMENT FOR FUTURE REFILLS. 90 tablet 0  . BD PEN NEEDLE NANO U/F 32G X 4 MM MISC USE DAILY AS DIRECTED 100 each 3  . carvedilol (COREG) 12.5 MG tablet TAKE 1 TABLET (12.5 MG TOTAL) BY MOUTH 2 (TWO) TIMES DAILY WITH A MEAL. 180 tablet 1  . Dulaglutide (TRULICITY) 4.5 ZD/6.3OV SOPN Inject 4.5 mg as directed once a week. 6 mL 3  . glucose blood (FREESTYLE LITE) test strip  1 each by Other route 2 (two) times daily. And lancets 2/day 200 each 3  . hydrALAZINE (APRESOLINE) 50 MG tablet Take 1 tablet by mouth 2 (two) times daily.    . insulin detemir (LEVEMIR FLEXTOUCH) 100 UNIT/ML FlexPen Inject 28 Units into the skin every morning. And pen needles 1/day 30 mL 3  . Lancets (FREESTYLE) lancets Use to monitor glucose levels BID; E11.22 100 each 12  . Vitamin D, Ergocalciferol, (DRISDOL) 1.25 MG (50000 UNIT) CAPS capsule Take 1 capsule (50,000 Units total) by mouth every Monday. 5 capsule 2  . nitroGLYCERIN (NITROSTAT) 0.4 MG SL tablet Place 1 tablet (0.4 mg total) under the tongue every 5 (five) minutes x 3 doses as needed for chest pain. Don't take within 24 hrs of Cialis (Patient not taking: Reported on 02/22/2022) 25 tablet 0  . furosemide (LASIX) 20 MG tablet TAKE 1  TABLET (20 MG TOTAL) BY MOUTH DAILY AS NEEDED (FOR SHORTNESS OF BREATH AND SWELLING.). (Patient not taking: Reported on 02/22/2022)     No facility-administered medications prior to visit.    No Known Allergies  ROS     Objective:    Physical Exam  BP (!) 146/94 (BP Location: Left Arm, Patient Position: Sitting, Cuff Size: Large)   Pulse 81   Temp 97.9 F (36.6 C) (Temporal)   Ht '6\' 3"'$  (1.905 m)   Wt 250 lb (113.4 kg)   SpO2 97%   BMI 31.25 kg/m  Wt Readings from Last 3 Encounters:  02/22/22 250 lb (113.4 kg)  02/13/22 250 lb (113.4 kg)  12/25/21 250 lb (113.4 kg)       Assessment & Plan:   Problem List Items Addressed This Visit       Cardiovascular and Mediastinum   Hypertensive heart disease with heart failure (HCC)   Relevant Medications   furosemide (LASIX) 20 MG tablet   Other Visit Diagnoses     Acute left-sided low back pain with left-sided sciatica    -  Primary   Relevant Medications   traMADol (ULTRAM) 50 MG tablet   Other Relevant Orders   DG Lumbar Spine Complete   Ambulatory referral to Physical Therapy       I am having Alexander [name] start on traMADol. I am also having him maintain his aspirin EC, freestyle, glucose blood, Trulicity, carvedilol, Levemir FlexTouch, Vitamin D (Ergocalciferol), BD Pen Needle Nano U/F, hydrALAZINE, nitroGLYCERIN, atorvastatin, and furosemide.  Meds ordered this encounter  Medications  . traMADol (ULTRAM) 50 MG tablet    Sig: Take 1 tablet (50 mg total) by mouth every 8 (eight) hours as needed for up to 5 days.    Dispense:  15 tablet    Refill:  0    Order Specific Question:   Supervising Provider    Answer:   Pricilla Holm A [[id number]]  . furosemide (LASIX) 20 MG tablet    Sig: TAKE 1 TABLET (20 MG TOTAL) BY MOUTH DAILY AS NEEDED (FOR SHORTNESS OF BREATH AND SWELLING.).    Dispense:  30 tablet    Refill:  0    Order Specific Question:   Supervising Provider    Answer:   Pricilla Holm A [[id number]]

## 2022-02-22 NOTE — Patient Instructions (Signed)
Please go downstairs for an x-ray of your low back before you leave.  Continue taking Tylenol over-the-counter as needed.  Also recommend using a heating pad or an ice pack and topical pain medicine as discussed.  I will refer you to physical therapy and they will call you to schedule a visit.  Let us know if you are having worsening symptoms   Acute Back Pain, Adult Acute back pain is sudden and usually short-lived. It is often caused by an injury to the muscles and tissues in the back. The injury may result from: A muscle, tendon, or ligament getting overstretched or torn. Ligaments are tissues that connect bones to each other. Lifting something improperly can cause a back strain. Wear and tear (degeneration) of the spinal disks. Spinal disks are circular tissue that provide cushioning between the bones of the spine (vertebrae). Twisting motions, such as while playing sports or doing yard work. A hit to the back. Arthritis. You may have a physical exam, lab tests, and imaging tests to find the cause of your pain. Acute back pain usually goes away with rest and home care. Follow these instructions at home: Managing pain, stiffness, and swelling Take over-the-counter and prescription medicines only as told by your health care provider. Treatment may include medicines for pain and inflammation that are taken by mouth or applied to the skin, or muscle relaxants. Your health care provider may recommend applying ice during the first 24-48 hours after your pain starts. To do this: Put ice in a plastic bag. Place a towel between your skin and the bag. Leave the ice on for 20 minutes, 2-3 times a day. Remove the ice if your skin turns bright red. This is very important. If you cannot feel pain, heat, or cold, you have a greater risk of damage to the area. If directed, apply heat to the affected area as often as told by your health care provider. Use the heat source that your health care provider  recommends, such as a moist heat pack or a heating pad. Place a towel between your skin and the heat source. Leave the heat on for 20-30 minutes. Remove the heat if your skin turns bright red. This is especially important if you are unable to feel pain, heat, or cold. You have a greater risk of getting burned. Activity  Do not stay in bed. Staying in bed for more than 1-2 days can delay your recovery. Sit up and stand up straight. Avoid leaning forward when you sit or hunching over when you stand. If you work at a desk, sit close to it so you do not need to lean over. Keep your chin tucked in. Keep your neck drawn back, and keep your elbows bent at a 90-degree angle (right angle). Sit high and close to the steering wheel when you drive. Add lower back (lumbar) support to your car seat, if needed. Take short walks on even surfaces as soon as you are able. Try to increase the length of time you walk each day. Do not sit, drive, or stand in one place for more than 30 minutes at a time. Sitting or standing for long periods of time can put stress on your back. Do not drive or use heavy machinery while taking prescription pain medicine. Use proper lifting techniques. When you bend and lift, use positions that put less stress on your back: [place] your knees. Keep the load close to your body. Avoid twisting. Exercise regularly as told by your  health care provider. Exercising helps your back heal faster and helps prevent back injuries by keeping muscles strong and flexible. Work with a physical therapist to make a safe exercise program, as recommended by your health care provider. Do any exercises as told by your physical therapist. Lifestyle Maintain a healthy weight. Extra weight puts stress on your back and makes it difficult to have good posture. Avoid activities or situations that make you feel anxious or stressed. Stress and anxiety increase muscle tension and can make back pain worse. Learn ways to  manage anxiety and stress, such as through exercise. General instructions Sleep on a firm mattress in a comfortable position. Try lying on your side with your knees slightly bent. If you lie on your back, put a pillow under your knees. Keep your head and neck in a straight line with your spine (neutral position) when using electronic equipment like smartphones or pads. To do this: Raise your smartphone or pad to look at it instead of bending your head or neck to look down. Put the smartphone or pad at the level of your face while looking at the screen. Follow your treatment plan as told by your health care provider. This may include: Cognitive or behavioral therapy. Acupuncture or massage therapy. Meditation or yoga. Contact a health care provider if: You have pain that is not relieved with rest or medicine. You have increasing pain going down into your legs or buttocks. Your pain does not improve after 2 weeks. You have pain at night. You lose weight without trying. You have a fever or chills. You develop nausea or vomiting. You develop abdominal pain. Get help right away if: You develop new bowel or bladder control problems. You have unusual weakness or numbness in your arms or legs. You feel faint. These symptoms may represent a serious problem that is an emergency. Do not wait to see if the symptoms will go away. Get medical help right away. Call your local emergency services (911 in the [place] Do not drive yourself to the hospital. Summary Acute back pain is sudden and usually short-lived. Use proper lifting techniques. When you bend and lift, use positions that put less stress on your back. Take over-the-counter and prescription medicines only as told by your health care provider, and apply heat or ice as told. This information is not intended to replace advice given to you by your health care provider. Make sure you discuss any questions you have with your health care  provider. Document Revised: 09/15/2020 Document Reviewed: 09/15/2020 Elsevier Patient Education  [place].

## 2022-02-24 DIAGNOSIS — M5442 Lumbago with sciatica, left side: Secondary | ICD-10-CM | POA: Insufficient documentation

## 2022-02-24 NOTE — Assessment & Plan Note (Signed)
Aware BP is elevated. Euvolemic.  Refilled Lasix and he will f/u with PCP

## 2022-02-24 NOTE — Assessment & Plan Note (Signed)
No acute distress or neuro deficit.  Lumbar Spine films ordered.  Cannot take NSAIDs due to renal dz Tylenol not helpful.  Tramadol prescribed Referral to PT

## 2022-02-25 ENCOUNTER — Encounter: Payer: Self-pay | Admitting: Family Medicine

## 2022-02-25 DIAGNOSIS — I700 Atherosclerosis of aorta: Secondary | ICD-10-CM | POA: Insufficient documentation

## 2022-02-28 NOTE — Therapy (Signed)
OUTPATIENT PHYSICAL THERAPY THORACOLUMBAR EVALUATION   Patient Name: Alexander [name] MRN: [id number] DOB:[birth year], 51 y.o., male Today's Date: 03/04/2022   PT End of Session - 03/04/22 1021     Visit Number 1    Date for PT Re-Evaluation 04/15/22    Authorization Type [employer]    PT Start Time 1021    PT Stop Time 1105    PT Time Calculation (min) 44 min    Activity Tolerance Patient tolerated treatment well    Behavior During Therapy [facility] for tasks assessed/performed             Past Medical History:  Diagnosis Date   CHEST PAIN 08/28/2009   DIABETES MELLITUS, TYPE II 03/20/2007   HYPERCHOLESTEROLEMIA 07/21/2008   HYPERTENSION 03/20/2007   Hypertensive urgency 03/27/2015   Left ventricular dysfunction 03/28/2015   EF 40-45%, no WMA, grade 2 diastolic dysfunction   Renal disorder    Past Surgical History:  Procedure Laterality Date   KNEE ARTHROSCOPY Right    Patient Active Problem List   Diagnosis Date Noted   Aortic atherosclerosis ([place]) 02/25/2022   Acute left-sided low back pain with left-sided sciatica 02/24/2022   GERD (gastroesophageal reflux disease) 10/26/2021   Colon cancer screening 10/26/2021   Bilateral hearing loss 05/10/2021   Eustachian tube dysfunction, left 05/10/2021   Persistent proteinuria 11/16/2019   Hyperkalemia 08/27/2018   Hyperphosphatemia 04/09/2018   Orthostasis 12/16/2017   Chest pain 04/04/2017   Normocytic anemia 04/04/2017   AKI (acute kidney injury) ([place]) 11/14/2016   Routine general medical examination at a health care facility 10/11/2016   Labile hypertension 07/12/2016   Dizziness 11/28/2015   Vitamin D deficiency 11/09/2015   Anemia due to stage 4 chronic kidney disease ([place]) 11/09/2015   Benign hypertension with CKD (chronic kidney disease) stage IV ([place]) 09/29/2015   Diabetic nephropathy ([place]) 03/29/2015   Diabetes mellitus with stage 4 chronic kidney disease ([place]) 03/29/2015   Acute renal failure ([place])    Dyslipidemia     Hypertensive urgency    Hypertensive heart disease with heart failure ([place]) 01/20/2015   Hypogonadism male 05/27/2012   Essential hypertension 03/20/2007    PCP: Hoyt Koch, MD   REFERRING PROVIDER: Girtha Rm, NP-C  REFERRING DIAG: [phone] (ICD-10-CM) - Acute left-sided low back pain with left-sided sciatica  Rationale for Evaluation and Treatment Rehabilitation  THERAPY DIAG:  Radiculopathy, lumbar region  Muscle weakness (generalized)  Other abnormalities of gait and mobility  Cramp and spasm  ONSET DATE: 2-3 weeks ago  SUBJECTIVE:  SUBJECTIVE STATEMENT: Patient states he has had back issues in the past, ruptured a disc in high school. Also re-aggravated the disc about 10 years ago. Steroids helped. Then 2-3 weeks ago, pain started in left low back and then began radiating down his left leg. Insidious onset. Pain meds have helped some. Mostly sits at his job. Sitting is worse. Feels like a constant cramp in his thigh and feels nerve pull. He has some neuropathy in his feet. Foot feels heavy now. Sleeping on left side better. 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 Driving very painful.  PERTINENT HISTORY:  Rt knee scope, HTN, DM, heart failure 2016, kidney disease  PAIN:  Are you having pain? Yes: NPRS scale: 5 now up to 10 this morning/10 Pain location: left lower back and then post leg to just below the knee Pain description: sharpness, shooting, constant cramp Aggravating factors: sitting, driving Relieving factors: lying seat all the way back   PRECAUTIONS: None  WEIGHT BEARING RESTRICTIONS No  FALLS:  Has patient fallen in last 6 months? No  LIVING ENVIRONMENT: Lives with: lives with their family Lives in: House/apartment Stairs: Yes: Internal: 15  steps; can reach both Has following equipment at home: None  OCCUPATION: English as a second language teacher, sits mostly, also musician in church  PLOF: [place].   OBJECTIVE:   DIAGNOSTIC FINDINGS:  XR 02/25/22 Normal anatomic alignment. No evidence for acute fracture or dislocation. Preservation of the vertebral body heights. L4-5 degenerative disc disease. L5-S1 degenerative disc disease. L4-5 and L5-S1 facet degenerative changes. Extensive aortic atherosclerosis  PATIENT SURVEYS:  Modified Oswestry 48% disability   SCREENING FOR RED FLAGS: Bowel or bladder incontinence: No Spinal tumors: No Cauda equina syndrome: No Compression fracture: No Abdominal aneurysm: No  COGNITION:  Overall cognitive status: Within functional limits for tasks assessed     SENSATION: WFL  MUSCLE LENGTH: Marked bil HS tightness (left limited by neural tension)  POSTURE: rounded shoulders, decreased lumbar lordosis, flexed trunk , and weight shift right  PALPATION: TTP at let gluteals, lumbar, lateral thigh, gastroc Spinal mobility: decreased UPA L4/5, L5/S1 left   LUMBAR ROM:   Active  A/PROM  eval  Flexion 25%*  Extension 50%  Right lateral flexion WNL  Left lateral flexion WNL  Right rotation full  Left rotation 90%   (Blank rows = not tested)  LOWER EXTREMITY ROM:     Right LE WFL Left: limited by pain in hip flexion, knee ext/flex, ankle DF, else [facility]  LOWER EXTREMITY MMT:    MMT Right eval Left eval  Hip flexion 4+ 4+  Hip extension 5 4  Hip abduction 5 4*  Hip adduction    Hip internal rotation 4+ 4-*  Hip external rotation 4+ 4-*  Knee flexion 5 4-*  Knee extension 5 4*  Ankle dorsiflexion 5 4-  Ankle plantarflexion    Ankle inversion 5 4-  Ankle eversion 5 4-   (Blank rows = not tested)  LUMBAR SPECIAL TESTS:  Straight leg raise test: Positive and Slump test: Positive  FUNCTIONAL TESTS:  Prone on elbows decreases pain; PPU 2x10 centralizes pain to  left low back/hip   GAIT: Distance walked: 40 Assistive device utilized: None Level of assistance: Complete Independence Comments: antalgic with increased knee flexion left, trunk lean left    TODAY'S TREATMENT  See pt ed   PATIENT EDUCATION:  Education details: HEP, POC discussed, postural education for sitting, transfers for sit <> stand, sit <>supine, disc education  and rationale for extension protocol Person educated: Patient Education method: Explanation, Demonstration, Verbal cues, and Handouts Education comprehension: verbalized understanding and returned demonstration   HOME EXERCISE PROGRAM: Access Code: [email or web address] URL: [email or web address] Date: 03/04/2022 Prepared by: Almyra Free  Exercises - Static Prone on Elbows  - 2-3 x daily - 7 x weekly - 1 sets - 1 reps - 5-10 min hold - Prone Press Up  - 3-4 x daily - 7 x weekly - 1-3 sets - 10 reps - Standing Lumbar Extension  - 4-5 x daily - 7 x weekly - 2 sets - 10 reps - Standing Lumbar Extension at [place]  - 4-5 x daily - 7 x weekly - 2 sets - 10 reps  Patient Education - Posture and Body Mechanics  ASSESSMENT:  CLINICAL IMPRESSION: KHYSON SEBESTA  is a 51 y.o. male who was seen today for physical therapy evaluation and treatment for acute left sided LBP with sciatica. He reports onset of low back pain beginning 2-3 weeks ago with insidious onset. He has had a ruptured disc in the past he reports. Pain is worse with flexion activities including sitting and driving and also affects his sleep. He ambulates with an antalgic gait and reports feelings of the leg wanting to give out. Alexander [name] has deficits in lumbar and left LE ROM, left LE and core strength and bil LE flexibility.  He has decreased spinal mobility at levels L4/5 and L5/S1 on the left and positive neural tension in the left sciatic nerve. Patient will benefit from skilled PT to address these deficits.     OBJECTIVE IMPAIRMENTS Abnormal  gait, decreased activity tolerance, decreased ROM, decreased strength, hypomobility, increased muscle spasms, impaired flexibility, improper body mechanics, and pain.   ACTIVITY LIMITATIONS bending, squatting, sleeping, transfers, and locomotion level  PARTICIPATION LIMITATIONS:  pain with most ADLS  PERSONAL FACTORS Time since onset of injury/illness/exacerbation and 3+ comorbidities: HTN, DM, kidney disease  are also affecting patient's functional outcome.   REHAB POTENTIAL: Excellent  CLINICAL DECISION MAKING: Stable/uncomplicated  EVALUATION COMPLEXITY: Low   GOALS: Goals reviewed with patient? Yes  SHORT TERM GOALS: Target date: 03/18/2022 (Remove Blue Hyperlink)  Patient will be independent with initial HEP.  Baseline:  Goal status: INITIAL  2.  Patient will report regular centralization of radicular symptoms with prone press ups.  Baseline:  Goal status: INITIAL  3.  Pt educated regarding ADL modifications and proper body mechanics Baseline:  Goal status: INITIAL   LONG TERM GOALS: Target date: 04/15/2022  (Remove Blue Hyperlink)  Patient will be independent with advanced/ongoing HEP to improve outcomes and carryover.  Baseline:  Goal status: INITIAL  2.  Patient will report 75% improvement in low back pain to improve QOL.  Baseline:  Goal status: INITIAL  3.  Patient to demonstrate ability to achieve and maintain good spinal alignment/posturing and body mechanics needed for daily activities. Baseline:  Goal status: INITIAL  4.  Patient will demonstrate functional pain free lumbar ROM to perform ADLs.   Baseline:  Goal status: INITIAL  5.  Patient will demonstrate improved functional strength as demonstrated by ability to squat for correct body mechanics and lifting. Baseline:  Goal status: INITIAL  6.  Patient will report 10% or less disability on LEFS to demonstrate improved functional ability.  Baseline: 48% Goal status: INITIAL   7.  Patient will  reports improved tolerance of sitting and driving by 73% or more. Baseline:  Goal status: INITIAL  8.  Patient able to  safely amb community distances with a non-antalgic gait pattern Baseline:  Goal status: INITIAL    PLAN: PT FREQUENCY: 2x/week  PT DURATION: 6 weeks  PLANNED INTERVENTIONS: Therapeutic exercises, Therapeutic activity, Neuromuscular re-education, Patient/Family education, Self Care, Joint mobilization, Joint manipulation, Aquatic Therapy, Dry Needling, Electrical stimulation, Spinal manipulation, Spinal mobilization, Cryotherapy, Moist heat, Taping, Traction, Ionotophoresis '4mg'$ /ml Dexamethasone, and Manual therapy.  PLAN FOR NEXT SESSION: Review and progress HEP with sciatic nerve glide as tolerated, Review ADL modification and lifting techniques (h/o already provided), core strengthening with extension bias, Manual and modalities prn.   Conway Fedora, PT 03/04/2022, 11:35 AM

## 2022-03-04 ENCOUNTER — Other Ambulatory Visit: Payer: Self-pay

## 2022-03-04 ENCOUNTER — Encounter: Payer: Self-pay | Admitting: Physical Therapy

## 2022-03-04 ENCOUNTER — Ambulatory Visit: Payer: 59 | Attending: Family Medicine | Admitting: Physical Therapy

## 2022-03-04 DIAGNOSIS — M6281 Muscle weakness (generalized): Secondary | ICD-10-CM | POA: Diagnosis present

## 2022-03-04 DIAGNOSIS — M5416 Radiculopathy, lumbar region: Secondary | ICD-10-CM | POA: Diagnosis present

## 2022-03-04 DIAGNOSIS — M5442 Lumbago with sciatica, left side: Secondary | ICD-10-CM | POA: Diagnosis not present

## 2022-03-04 DIAGNOSIS — R252 Cramp and spasm: Secondary | ICD-10-CM | POA: Insufficient documentation

## 2022-03-04 DIAGNOSIS — R2689 Other abnormalities of gait and mobility: Secondary | ICD-10-CM | POA: Insufficient documentation

## 2022-03-06 NOTE — Therapy (Signed)
OUTPATIENT PHYSICAL THERAPY TREATMENT NOTE   Patient Name: Alexander [name] MRN: [id number] DOB:[birth year], 51 y.o., male Today's Date: 03/07/2022   PT End of Session - 03/07/22 0853     Visit Number 2    Date for PT Re-Evaluation 04/15/22    Authorization Type U[employer]    PT Start Time 2202    PT Stop Time 0934    PT Time Calculation (min) 41 min    Activity Tolerance Patient tolerated treatment well    Behavior During Therapy [facility] for tasks assessed/performed              Past Medical History:  Diagnosis Date   CHEST PAIN 08/28/2009   DIABETES MELLITUS, TYPE II 03/20/2007   HYPERCHOLESTEROLEMIA 07/21/2008   HYPERTENSION 03/20/2007   Hypertensive urgency 03/27/2015   Left ventricular dysfunction 03/28/2015   EF 40-45%, no WMA, grade 2 diastolic dysfunction   Renal disorder    Past Surgical History:  Procedure Laterality Date   KNEE ARTHROSCOPY Right    Patient Active Problem List   Diagnosis Date Noted   Aortic atherosclerosis ([place]) 02/25/2022   Acute left-sided low back pain with left-sided sciatica 02/24/2022   GERD (gastroesophageal reflux disease) 10/26/2021   Colon cancer screening 10/26/2021   Bilateral hearing loss 05/10/2021   Eustachian tube dysfunction, left 05/10/2021   Persistent proteinuria 11/16/2019   Hyperkalemia 08/27/2018   Hyperphosphatemia 04/09/2018   Orthostasis 12/16/2017   Chest pain 04/04/2017   Normocytic anemia 04/04/2017   AKI (acute kidney injury) ([place]) 11/14/2016   Routine general medical examination at a health care facility 10/11/2016   Labile hypertension 07/12/2016   Dizziness 11/28/2015   Vitamin D deficiency 11/09/2015   Anemia due to stage 4 chronic kidney disease ([place]) 11/09/2015   Benign hypertension with CKD (chronic kidney disease) stage IV ([place]) 09/29/2015   Diabetic nephropathy ([place]) 03/29/2015   Diabetes mellitus with stage 4 chronic kidney disease ([place]) 03/29/2015   Acute renal failure ([place])    Dyslipidemia     Hypertensive urgency    Hypertensive heart disease with heart failure ([place]) 01/20/2015   Hypogonadism male 05/27/2012   Essential hypertension 03/20/2007    PCP: Hoyt Koch, MD   REFERRING PROVIDER: Girtha Rm, NP-C  REFERRING DIAG: [phone] (ICD-10-CM) - Acute left-sided low back pain with left-sided sciatica  Rationale for Evaluation and Treatment Rehabilitation  THERAPY DIAG:  Radiculopathy, lumbar region  Muscle weakness (generalized)  Other abnormalities of gait and mobility  Cramp and spasm  ONSET DATE: 2-3 weeks ago  SUBJECTIVE:  SUBJECTIVE STATEMENT: The press ups are helping. Only doing morning and night right now. Some tenderness along upper thigh.   PERTINENT HISTORY:  Rt knee scope, HTN, DM, heart failure 2016, kidney disease  PAIN:  Are you having pain? Yes: NPRS scale: 4/10 Pain location: left lower back and then post leg to just below the knee Pain description: sharpness, shooting, constant cramp Aggravating factors: sitting, driving Relieving factors: lying seat all the way back   PRECAUTIONS: None  WEIGHT BEARING RESTRICTIONS No  FALLS:  Has patient fallen in last 6 months? No  LIVING ENVIRONMENT: Lives with: lives with their family Lives in: House/apartment Stairs: Yes: Internal: 15 steps; can reach both Has following equipment at home: None  OCCUPATION: English as a second language teacher, sits mostly, also musician in church  PLOF: [place] it.   OBJECTIVE:   DIAGNOSTIC FINDINGS:  XR 02/25/22 Normal anatomic alignment. No evidence for acute fracture or dislocation. Preservation of the vertebral body heights. L4-5 degenerative disc disease. L5-S1 degenerative disc disease. L4-5 and L5-S1 facet degenerative changes. Extensive aortic  atherosclerosis  PATIENT SURVEYS:  Modified Oswestry 48% disability   SCREENING FOR RED FLAGS: Bowel or bladder incontinence: No Spinal tumors: No Cauda equina syndrome: No Compression fracture: No Abdominal aneurysm: No  COGNITION:  Overall cognitive status: Within functional limits for tasks assessed     SENSATION: WFL  MUSCLE LENGTH: Marked bil HS tightness (left limited by neural tension)  POSTURE: rounded shoulders, decreased lumbar lordosis, flexed trunk , and weight shift right  PALPATION: TTP at let gluteals, lumbar, lateral thigh, gastroc Spinal mobility: decreased UPA L4/5, L5/S1 left   LUMBAR ROM:   Active  A/PROM  eval  Flexion 25%*  Extension 50%  Right lateral flexion WNL  Left lateral flexion WNL  Right rotation full  Left rotation 90%   (Blank rows = not tested)  LOWER EXTREMITY ROM:     Right LE WFL Left: limited by pain in hip flexion, knee ext/flex, ankle DF, else [facility]  LOWER EXTREMITY MMT:    MMT Right eval Left eval  Hip flexion 4+ 4+  Hip extension 5 4  Hip abduction 5 4*  Hip adduction    Hip internal rotation 4+ 4-*  Hip external rotation 4+ 4-*  Knee flexion 5 4-*  Knee extension 5 4*  Ankle dorsiflexion 5 4-  Ankle plantarflexion    Ankle inversion 5 4-  Ankle eversion 5 4-   (Blank rows = not tested)  LUMBAR SPECIAL TESTS:  Straight leg raise test: Positive and Slump test: Positive  FUNCTIONAL TESTS:  Prone on elbows decreases pain; PPU 2x10 centralizes pain to left low back/hip   GAIT: Distance walked: 40 Assistive device utilized: None Level of assistance: Complete Independence Comments: antalgic with increased knee flexion left, trunk lean left    TODAY'S TREATMENT   03/07/22 Press ups 2 x 15 HS stretch with strap x 30 sec bil Nerve glide in LAQ position 5 sec on/off x 10 Seated fig 4 2x 30 sec bil   Manual Therapy: UPA mob to L L2/3 to L5/S1 Skilled palpation and monitoring of soft tissues during  DN STM to left gluteals and left lumbar in [facility]  Trigger Point Dry-Needling  Treatment instructions: Expect mild to moderate muscle soreness. S/S of pneumothorax if dry needled over a lung field, and to seek immediate medical attention should they occur. Patient verbalized understanding of these instructions and education.  Patient Consent Given: Yes Education handout provided: Yes Muscles treated: glute  min/med/max Electrical stimulation performed: No Parameters: N/A Treatment response/outcome: Twitch Response Elicited and Palpable Increase in Muscle Length   Eval: See pt ed   PATIENT EDUCATION:  Education details: DN education and aftercare Person educated: Patient Education method: Explanation, Demonstration, Verbal cues, and Handouts Education comprehension: verbalized understanding and returned demonstration   HOME EXERCISE PROGRAM: Access Code: [email or web address]  ASSESSMENT:  CLINICAL IMPRESSION: JERI RAWLINS  presents with decreased pain today and good response to extension protocol. HEP progressed with nerve glide and hip stretch. Initial trial of DN/MT with good response in gluteals. He will likely benefit from more DN/MT in the hip and possibly lumbar as well.     OBJECTIVE IMPAIRMENTS Abnormal gait, decreased activity tolerance, decreased ROM, decreased strength, hypomobility, increased muscle spasms, impaired flexibility, improper body mechanics, and pain.   ACTIVITY LIMITATIONS bending, squatting, sleeping, transfers, and locomotion level  PARTICIPATION LIMITATIONS:  pain with most ADLS  PERSONAL FACTORS Time since onset of injury/illness/exacerbation and 3+ comorbidities: HTN, DM, kidney disease  are also affecting patient's functional outcome.   REHAB POTENTIAL: Excellent  CLINICAL DECISION MAKING: Stable/uncomplicated  EVALUATION COMPLEXITY: Low   GOALS: Goals reviewed with patient? Yes  SHORT TERM GOALS: Target date: 03/18/2022 (Remove Blue  Hyperlink)  Patient will be independent with initial HEP.  Baseline:  Goal status: INITIAL  2.  Patient will report regular centralization of radicular symptoms with prone press ups.  Baseline:  Goal status: INITIAL  3.  Pt educated regarding ADL modifications and proper body mechanics Baseline:  Goal status: INITIAL   LONG TERM GOALS: Target date: 04/15/2022  (Remove Blue Hyperlink)  Patient will be independent with advanced/ongoing HEP to improve outcomes and carryover.  Baseline:  Goal status: INITIAL  2.  Patient will report 75% improvement in low back pain to improve QOL.  Baseline:  Goal status: INITIAL  3.  Patient to demonstrate ability to achieve and maintain good spinal alignment/posturing and body mechanics needed for daily activities. Baseline:  Goal status: INITIAL  4.  Patient will demonstrate functional pain free lumbar ROM to perform ADLs.   Baseline:  Goal status: INITIAL  5.  Patient will demonstrate improved functional strength as demonstrated by ability to squat for correct body mechanics and lifting. Baseline:  Goal status: INITIAL  6.  Patient will report 10% or less disability on LEFS to demonstrate improved functional ability.  Baseline: 48% Goal status: INITIAL   7.  Patient will reports improved tolerance of sitting and driving by 38% or more. Baseline:  Goal status: INITIAL  8.  Patient able to safely amb community distances with a non-antalgic gait pattern Baseline:  Goal status: INITIAL    PLAN: PT FREQUENCY: 2x/week  PT DURATION: 6 weeks  PLANNED INTERVENTIONS: Therapeutic exercises, Therapeutic activity, Neuromuscular re-education, Patient/Family education, Self Care, Joint mobilization, Joint manipulation, Aquatic Therapy, Dry Needling, Electrical stimulation, Spinal manipulation, Spinal mobilization, Cryotherapy, Moist heat, Taping, Traction, Ionotophoresis '4mg'$ /ml Dexamethasone, and Manual therapy.  PLAN FOR NEXT SESSION:   Review ADL modification and lifting techniques (h/o already provided), core strengthening with extension bias, Manual and modalities prn.   Destany Severns, PT 03/07/2022, 11:59 AM

## 2022-03-07 ENCOUNTER — Encounter: Payer: Self-pay | Admitting: Physical Therapy

## 2022-03-07 ENCOUNTER — Ambulatory Visit: Payer: 59 | Admitting: Physical Therapy

## 2022-03-07 DIAGNOSIS — R252 Cramp and spasm: Secondary | ICD-10-CM

## 2022-03-07 DIAGNOSIS — M5416 Radiculopathy, lumbar region: Secondary | ICD-10-CM

## 2022-03-07 DIAGNOSIS — R2689 Other abnormalities of gait and mobility: Secondary | ICD-10-CM

## 2022-03-07 DIAGNOSIS — M6281 Muscle weakness (generalized): Secondary | ICD-10-CM

## 2022-03-08 ENCOUNTER — Other Ambulatory Visit: Payer: Self-pay | Admitting: Internal Medicine

## 2022-03-09 ENCOUNTER — Other Ambulatory Visit: Payer: Self-pay | Admitting: Nurse Practitioner

## 2022-03-09 DIAGNOSIS — E559 Vitamin D deficiency, unspecified: Secondary | ICD-10-CM

## 2022-03-13 ENCOUNTER — Ambulatory Visit: Payer: 59 | Attending: Family Medicine

## 2022-03-13 DIAGNOSIS — M5416 Radiculopathy, lumbar region: Secondary | ICD-10-CM | POA: Insufficient documentation

## 2022-03-13 DIAGNOSIS — M6281 Muscle weakness (generalized): Secondary | ICD-10-CM | POA: Diagnosis present

## 2022-03-13 DIAGNOSIS — R252 Cramp and spasm: Secondary | ICD-10-CM | POA: Insufficient documentation

## 2022-03-13 DIAGNOSIS — R2689 Other abnormalities of gait and mobility: Secondary | ICD-10-CM | POA: Diagnosis present

## 2022-03-13 NOTE — Therapy (Signed)
OUTPATIENT PHYSICAL THERAPY TREATMENT NOTE   Patient Name: Alexander [name] MRN: [id number] DOB:05/26/1971, 51 y.o., male Today's Date: 03/13/2022   PT End of Session - 03/13/22 1041     Visit Number 3    Date for PT Re-Evaluation 04/15/22    Authorization Type UHC    PT Start Time 0945   pt late   PT Stop Time 1030    PT Time Calculation (min) 45 min    Activity Tolerance Patient tolerated treatment well    Behavior During Therapy [facility] for tasks assessed/performed               Past Medical History:  Diagnosis Date   CHEST PAIN 08/28/2009   DIABETES MELLITUS, TYPE II 03/20/2007   HYPERCHOLESTEROLEMIA 07/21/2008   HYPERTENSION 03/20/2007   Hypertensive urgency 03/27/2015   Left ventricular dysfunction 03/28/2015   EF 40-45%, no WMA, grade 2 diastolic dysfunction   Renal disorder    Past Surgical History:  Procedure Laterality Date   KNEE ARTHROSCOPY Right    Patient Active Problem List   Diagnosis Date Noted   Aortic atherosclerosis ([place]) 02/25/2022   Acute left-sided low back pain with left-sided sciatica 02/24/2022   GERD (gastroesophageal reflux disease) 10/26/2021   Colon cancer screening 10/26/2021   Bilateral hearing loss 05/10/2021   Eustachian tube dysfunction, left 05/10/2021   Persistent proteinuria 11/16/2019   Hyperkalemia 08/27/2018   Hyperphosphatemia 04/09/2018   Orthostasis 12/16/2017   Chest pain 04/04/2017   Normocytic anemia 04/04/2017   AKI (acute kidney injury) ([place]) 11/14/2016   Routine general medical examination at a health care facility 10/11/2016   Labile hypertension 07/12/2016   Dizziness 11/28/2015   Vitamin D deficiency 11/09/2015   Anemia due to stage 4 chronic kidney disease ([place]) 11/09/2015   Benign hypertension with CKD (chronic kidney disease) stage IV ([place]) 09/29/2015   Diabetic nephropathy ([place]) 03/29/2015   Diabetes mellitus with stage 4 chronic kidney disease ([place]) 03/29/2015   Acute renal failure ([place])    Dyslipidemia     Hypertensive urgency    Hypertensive heart disease with heart failure ([place]) 01/20/2015   Hypogonadism male 05/27/2012   Essential hypertension 03/20/2007    PCP: Hoyt Koch, MD   REFERRING PROVIDER: Girtha Rm, NP-C  REFERRING DIAG: [phone] (ICD-10-CM) - Acute left-sided low back pain with left-sided sciatica  Rationale for Evaluation and Treatment Rehabilitation  THERAPY DIAG:  Radiculopathy, lumbar region  Muscle weakness (generalized)  Other abnormalities of gait and mobility  Cramp and spasm  ONSET DATE: 2-3 weeks ago  SUBJECTIVE:  SUBJECTIVE STATEMENT: Exercises are helping, L foot feels weaker than R.   PERTINENT HISTORY:  Rt knee scope, HTN, DM, heart failure 2016, kidney disease  PAIN:  Are you having pain? Yes: NPRS scale: 5/10 Pain location: left lower back and then post leg to just below the knee Pain description: sharpness, shooting, constant cramp Aggravating factors: sitting, driving Relieving factors: lying seat all the way back   PRECAUTIONS: None  WEIGHT BEARING RESTRICTIONS No  FALLS:  Has patient fallen in last 6 months? No  LIVING ENVIRONMENT: Lives with: lives with their family Lives in: House/apartment Stairs: Yes: Internal: 15 steps; can reach both Has following equipment at home: None  OCCUPATION: English as a second language teacher, sits mostly, also musician in church  PLOF: [place] it.   OBJECTIVE:   DIAGNOSTIC FINDINGS:  XR 02/25/22 Normal anatomic alignment. No evidence for acute fracture or dislocation. Preservation of the vertebral body heights. L4-5 degenerative disc disease. L5-S1 degenerative disc disease. L4-5 and L5-S1 facet degenerative changes. Extensive aortic atherosclerosis  PATIENT SURVEYS:  Modified Oswestry  48% disability   SCREENING FOR RED FLAGS: Bowel or bladder incontinence: No Spinal tumors: No Cauda equina syndrome: No Compression fracture: No Abdominal aneurysm: No  COGNITION:  Overall cognitive status: Within functional limits for tasks assessed     SENSATION: WFL  MUSCLE LENGTH: Marked bil HS tightness (left limited by neural tension)  POSTURE: rounded shoulders, decreased lumbar lordosis, flexed trunk , and weight shift right  PALPATION: TTP at let gluteals, lumbar, lateral thigh, gastroc Spinal mobility: decreased UPA L4/5, L5/S1 left   LUMBAR ROM:   Active  A/PROM  eval  Flexion 25%*  Extension 50%  Right lateral flexion WNL  Left lateral flexion WNL  Right rotation full  Left rotation 90%   (Blank rows = not tested)  LOWER EXTREMITY ROM:     Right LE WFL Left: limited by pain in hip flexion, knee ext/flex, ankle DF, else [facility]  LOWER EXTREMITY MMT:    MMT Right eval Left eval  Hip flexion 4+ 4+  Hip extension 5 4  Hip abduction 5 4*  Hip adduction    Hip internal rotation 4+ 4-*  Hip external rotation 4+ 4-*  Knee flexion 5 4-*  Knee extension 5 4*  Ankle dorsiflexion 5 4-  Ankle plantarflexion    Ankle inversion 5 4-  Ankle eversion 5 4-   (Blank rows = not tested)  LUMBAR SPECIAL TESTS:  Straight leg raise test: Positive and Slump test: Positive  FUNCTIONAL TESTS:  Prone on elbows decreases pain; PPU 2x10 centralizes pain to left low back/hip   GAIT: Distance walked: 40 Assistive device utilized: None Level of assistance: Complete Independence Comments: antalgic with increased knee flexion left, trunk lean left    TODAY'S TREATMENT  03/13/22 Bike L2x29mn Press up 3x15 sec Prone HS curls x 10 bil Prone hip extension x 5 bil Quadruped cat/cow x 10  Quadruped glute kicks 2x5 bil  Therapeutic Activity: Review lifting techniques, provided instruction on proper squat technique with hip hinge and keeping neutral spine  Manual  Therapy: STM to L glute med/min area  03/07/22 Press ups 2 x 15 HS stretch with strap x 30 sec bil Nerve glide in LAQ position 5 sec on/off x 10 Seated fig 4 2x 30 sec bil   Manual Therapy: UPA mob to L L2/3 to L5/S1 Skilled palpation and monitoring of soft tissues during DN STM to left gluteals and [facility] Trigger Point  Dry-Needling  Treatment instructions: Expect mild to moderate muscle soreness. S/S of pneumothorax if dry needled over a lung field, and to seek immediate medical attention should they occur. Patient verbalized understanding of these instructions and education.  Patient Consent Given: Yes Education handout provided: Yes Muscles treated: glute min/med/max Electrical stimulation performed: No Parameters: N/A Treatment response/outcome: Twitch Response Elicited and Palpable Increase in Muscle Length   Eval: See pt ed   PATIENT EDUCATION:  Education details: DN education and aftercare Person educated: Patient Education method: Explanation, Demonstration, Verbal cues, and Handouts Education comprehension: verbalized understanding and returned demonstration   HOME EXERCISE PROGRAM: Access Code: [email or web address]  ASSESSMENT:  CLINICAL IMPRESSION: No issues with today's treatment. Continued progressing hip strength and flexibility to tolerance. More effort noted with L LE with most exercises. He remains to have tenderness along L glute med/min and ITB. He would benefit from more work on LLE strengthening and DN.     OBJECTIVE IMPAIRMENTS Abnormal gait, decreased activity tolerance, decreased ROM, decreased strength, hypomobility, increased muscle spasms, impaired flexibility, improper body mechanics, and pain.   ACTIVITY LIMITATIONS bending, squatting, sleeping, transfers, and locomotion level  PARTICIPATION LIMITATIONS:  pain with most ADLS  PERSONAL FACTORS Time since onset of injury/illness/exacerbation and 3+ comorbidities: HTN, DM, kidney disease   are also affecting patient's functional outcome.   REHAB POTENTIAL: Excellent  CLINICAL DECISION MAKING: Stable/uncomplicated  EVALUATION COMPLEXITY: Low   GOALS: Goals reviewed with patient? Yes  SHORT TERM GOALS: Target date: 03/18/2022 (Remove Blue Hyperlink)  Patient will be independent with initial HEP.  Baseline:  Goal status: IN PROGRESS  2.  Patient will report regular centralization of radicular symptoms with prone press ups.  Baseline:  Goal status: IN PROGRESS  3.  Pt educated regarding ADL modifications and proper body mechanics Baseline:  Goal status: IN PROGRESS   LONG TERM GOALS: Target date: 04/15/2022  (Remove Blue Hyperlink)  Patient will be independent with advanced/ongoing HEP to improve outcomes and carryover.  Baseline:  Goal status: IN PROGRESS  2.  Patient will report 75% improvement in low back pain to improve QOL.  Baseline:  Goal status: IN PROGRESS  3.  Patient to demonstrate ability to achieve and maintain good spinal alignment/posturing and body mechanics needed for daily activities. Baseline:  Goal status: IN PROGRESS  4.  Patient will demonstrate functional pain free lumbar ROM to perform ADLs.   Baseline:  Goal status: IN PROGRESS  5.  Patient will demonstrate improved functional strength as demonstrated by ability to squat for correct body mechanics and lifting. Baseline:  Goal status: IN PROGRESS  6.  Patient will report 10% or less disability on LEFS to demonstrate improved functional ability.  Baseline: 48% Goal status: IN PROGRESS   7.  Patient will reports improved tolerance of sitting and driving by 09% or more. Baseline:  Goal status: IN PROGRESS  8.  Patient able to safely amb community distances with a non-antalgic gait pattern Baseline:  Goal status: IN PROGRESS    PLAN: PT FREQUENCY: 2x/week  PT DURATION: 6 weeks  PLANNED INTERVENTIONS: Therapeutic exercises, Therapeutic activity, Neuromuscular  re-education, Patient/Family education, Self Care, Joint mobilization, Joint manipulation, Aquatic Therapy, Dry Needling, Electrical stimulation, Spinal manipulation, Spinal mobilization, Cryotherapy, Moist heat, Taping, Traction, Ionotophoresis '4mg'$ /ml Dexamethasone, and Manual therapy.  PLAN FOR NEXT SESSION:  Review ADL modification and lifting techniques (h/o already provided), core strengthening with extension bias, Manual and modalities prn.   Artist Pais, PTA 03/13/2022, 10:47 AM

## 2022-03-14 ENCOUNTER — Encounter: Payer: Self-pay | Admitting: Internal Medicine

## 2022-03-14 ENCOUNTER — Ambulatory Visit: Payer: 59 | Admitting: Internal Medicine

## 2022-03-14 VITALS — BP 136/90 | HR 90 | Ht 75.0 in | Wt 250.4 lb

## 2022-03-14 DIAGNOSIS — E1122 Type 2 diabetes mellitus with diabetic chronic kidney disease: Secondary | ICD-10-CM

## 2022-03-14 DIAGNOSIS — E785 Hyperlipidemia, unspecified: Secondary | ICD-10-CM

## 2022-03-14 DIAGNOSIS — Z794 Long term (current) use of insulin: Secondary | ICD-10-CM | POA: Diagnosis not present

## 2022-03-14 DIAGNOSIS — N184 Chronic kidney disease, stage 4 (severe): Secondary | ICD-10-CM

## 2022-03-14 LAB — POCT GLYCOSYLATED HEMOGLOBIN (HGB A1C): Hemoglobin A1C: 7.5 % — AB (ref 4.0–5.6)

## 2022-03-14 MED ORDER — LEVEMIR FLEXTOUCH 100 UNIT/ML ~~LOC~~ SOPN: 34.0000 [IU] | PEN_INJECTOR | Freq: Every day | SUBCUTANEOUS | 3 refills | Status: DC

## 2022-03-14 MED ORDER — SEMAGLUTIDE (1 MG/DOSE) 4 MG/3ML ~~LOC~~ SOPN: 1.0000 mg | PEN_INJECTOR | SUBCUTANEOUS | 3 refills | Status: DC

## 2022-03-14 NOTE — Patient Instructions (Addendum)
Please try to change Trulicity to: - Ozmepic 1 mg weekly   Move: - Levemir 28 units to bedtime  If you are not able to start Ozempic, continue the same dose of Trulicity but increase Levemir ti 32-34 units at night.  Please discuss with nephrology if we can use: [place] or Jardiance.  Please return in 3-4 months.  PATIENT INSTRUCTIONS FOR TYPE 2 DIABETES:  **Please join MyChart!** - see attached instructions about how to join if you have not done so already.  DIET AND EXERCISE Diet and exercise is an important part of diabetic treatment.  We recommended aerobic exercise in the form of brisk walking (working between 40-60% of maximal aerobic capacity, similar to brisk walking) for 150 minutes per week (such as 30 minutes five days per week) along with 3 times per week performing 'resistance' training (using various gauge rubber tubes with handles) 5-10 exercises involving the major muscle groups (upper body, lower body and core) performing 10-15 repetitions (or near fatigue) each exercise. Start at half the above goal but build slowly to reach the above goals. If limited by weight, joint pain, or disability, we recommend daily walking in a swimming pool with water up to waist to reduce pressure from joints while allow for adequate exercise.    BLOOD GLUCOSES Monitoring your blood glucoses is important for continued management of your diabetes. Please check your blood glucoses 2-4 times a day: fasting, before meals and at bedtime (you can rotate these measurements - e.g. one day check before the 3 meals, the next day check before 2 of the meals and before bedtime, etc.).   HYPOGLYCEMIA (low blood sugar) Hypoglycemia is usually a reaction to not eating, exercising, or taking too much insulin/ other diabetes drugs.  Symptoms include tremors, sweating, hunger, confusion, headache, etc. Treat IMMEDIATELY with 15 grams of Carbs: 4 glucose tablets  cup regular juice/soda 2 tablespoons  raisins 4 teaspoons sugar 1 tablespoon honey Recheck blood glucose in 15 mins and repeat above if still symptomatic/blood glucose <100.  RECOMMENDATIONS TO REDUCE YOUR RISK OF DIABETIC COMPLICATIONS: * Take your prescribed MEDICATION(S) * Follow a DIABETIC diet: Complex carbs, fiber rich foods, (monounsaturated and polyunsaturated) fats * AVOID saturated/trans fats, high fat foods, >2,300 mg salt per day. * EXERCISE at least 5 times a week for 30 minutes or preferably daily.  * DO NOT SMOKE OR DRINK more than 1 drink a day. * Check your FEET every day. Do not wear tightfitting shoes. Contact us if you develop an ulcer * See your EYE doctor once a year or more if needed * Get a FLU shot once a year * Get a PNEUMONIA vaccine once before and once after age 57 years  GOALS:  * Your Hemoglobin A1c of <7%  * fasting sugars need to be <130 * after meals sugars need to be <180 (2h after you start eating) * Your Systolic BP should be 371 or lower  * Your Diastolic BP should be 80 or lower  * Your HDL (Good Cholesterol) should be 40 or higher  * Your LDL (Bad Cholesterol) should be 100 or lower. * Your Triglycerides should be 150 or lower  * Your Urine microalbumin (kidney function) should be <30 * Your Body Mass Index should be 25 or lower    Please consider the following ways to cut down carbs and fat and increase fiber and micronutrients in your diet: - substitute whole grain for white bread or pasta - substitute brown rice for  white rice - substitute 90-calorie flat bread pieces for slices of bread when possible - substitute sweet potatoes or yams for white potatoes - substitute humus for margarine - substitute tofu for cheese when possible - substitute almond or rice milk for regular milk (would not drink soy milk daily due to concern for soy estrogen influence on breast cancer risk) - substitute dark chocolate for other sweets when possible - substitute water - can add lemon or  orange slices for taste - for diet sodas (artificial sweeteners will trick your body that you can eat sweets without getting calories and will lead you to overeating and weight gain in the long run) - do not skip breakfast or other meals (this will slow down the metabolism and will result in more weight gain over time)  - can try smoothies made from fruit and almond/rice milk in am instead of regular breakfast - can also try old-fashioned (not instant) oatmeal made with almond/rice milk in am - order the dressing on the side when eating salad at a restaurant (pour less than half of the dressing on the salad) - eat as little meat as possible - can try juicing, but should not forget that juicing will get rid of the fiber, so would alternate with eating raw veg./fruits or drinking smoothies - use as little oil as possible, even when using olive oil - can dress a salad with a mix of balsamic vinegar and lemon juice, for e.g. - use agave nectar, stevia sugar, or regular sugar rather than artificial sweateners - steam or broil/roast veggies  - snack on veggies/fruit/nuts (unsalted, preferably) when possible, rather than processed foods - reduce or eliminate aspartame in diet (it is in diet sodas, chewing gum, etc) Read the labels!  Try to read Dr. Janene Harvey book: "Program for Reversing Diabetes" for other ideas for healthy eating.

## 2022-03-14 NOTE — Progress Notes (Signed)
Patient ID: Alexander [name], male   DOB: 09-Feb-1971, 51 y.o.   MRN: [id number]  HPI: Alexander [name] is a 51 y.o.-year-old male, returning for follow-up for DM2, dx in 2004, insulin-dependent since 2013, uncontrolled, with complications (CHF, CKD, PN, DR). Pt. previously saw Dr. Loanne Drilling, last visit 6 months ago.  Reviewed HbA1c: Lab Results  Component Value Date   HGBA1C 7.9 (A) 09/05/2021   HGBA1C 7.2 (A) 06/06/2021   HGBA1C 7.2 (A) 02/20/2021   HGBA1C 6.6 (A) 03/08/2020   HGBA1C 7.9 (A) 12/07/2019   HGBA1C 7.9 (A) 09/07/2019   HGBA1C 6.8 (A) 06/02/2019   HGBA1C 8.3 (A) 11/24/2018   HGBA1C 7.4 (A) 07/20/2018   HGBA1C 6.4 11/11/2016   Pt is on a regimen of: - Trulicity 4.5 mg weekly (increased at last visit with Dr. Loanne Drilling) - Levemir 28 units in am Prev. On Metformin.  Pt checks his sugars 2x a day and they are: - am: 130-180 - 2h after b'fast: n/c - before lunch: n/c - 2h after lunch: n/c - before dinner: n/c - 2h after dinner: n/c - bedtime: 130-180 - nighttime: n/c Lowest sugar was 80 >> 130; he has hypoglycemia awareness at 80.  Highest sugar was 240 >> 185.  Glucometer: Freestyle lite  Pt's meals are: - Breakfast: protein shake - Lunch: largest meal: chicken tenders or salad - Dinner: pasta, chicken fried or baked - Snacks: multiple - fruit He and his wife just got an air [place] 2-3 months ago.  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, last BUN/creatinine:  Lab Results  Component Value Date   BUN 40 (H) 11/30/2021   BUN 41 (H) 09/05/2021   CREATININE 2.73 (H) 11/30/2021   CREATININE 2.90 (H) 09/05/2021   He has proteinuria: Component 11/26/21 05/17/20 11/15/19 06/02/17 11/14/16  U Interval Random Random Random Random Random  U Volume '10 30 3 10 10  '$ U Protein Concentrate 60 '10 31 12 14  '$ U Creatinine Concentrate 202 84 139 139 190  U Protein/G Creatinine 0.00 - 200.00 MG/G CREATININE 297.03 High  119.05 223.02 High  86.33 73.68   -+ HL; last set of  lipids: Lab Results  Component Value Date   CHOL 149 11/30/2021   HDL 45.50 11/30/2021   LDLCALC 87 11/30/2021   LDLDIRECT 164.0 07/15/2014   TRIG 80.0 11/30/2021   CHOLHDL 3 11/30/2021  On Lipitor 80 mg daily.  - last eye exam was on 07/18/2021. No DR reportedly.   - + numbness and tingling in his feet (L>R - 2/2 sciatica).  Last foot exam 02/20/2021.  Patient also has a history of HTN.  ROS: + see HPI No increased urination, blurry vision, nausea, chest pain.  Past Medical History:  Diagnosis Date   CHEST PAIN 08/28/2009   DIABETES MELLITUS, TYPE II 03/20/2007   HYPERCHOLESTEROLEMIA 07/21/2008   HYPERTENSION 03/20/2007   Hypertensive urgency 03/27/2015   Left ventricular dysfunction 03/28/2015   EF 40-45%, no WMA, grade 2 diastolic dysfunction   Renal disorder    Past Surgical History:  Procedure Laterality Date   KNEE ARTHROSCOPY Right    Social History   Socioeconomic History   Marital status: Married    Spouse name: Not on file   Number of children: Not on file   Years of education: Not on file   Highest education level: Not on file  Occupational History   Occupation: English as a second language teacher    Employer: [employer]  Tobacco Use   Smoking status: Never   Smokeless  tobacco: Never  Vaping Use   Vaping Use: Never used  Substance and Sexual Activity   Alcohol use: No   Drug use: No   Sexual activity: Not on file  Other Topics Concern   Not on file  Social History Narrative   Not on file   Social Determinants of Health   Financial Resource Strain: Not on file  Food Insecurity: Not on file  Transportation Needs: Not on file  Physical Activity: Not on file  Stress: Not on file  Social Connections: Not on file  Intimate Partner Violence: Not on file   Current Outpatient Medications on File Prior to Visit  Medication Sig Dispense Refill   aspirin EC 81 MG EC tablet Take 1 tablet (81 mg total) by mouth daily.     atorvastatin (LIPITOR) 40 MG tablet TAKE 1 TABLET (40 MG  TOTAL) BY MOUTH DAILY. KEEP UPCOMING APPOINTMENT FOR FUTURE REFILLS. 90 tablet 0   BD PEN NEEDLE NANO U/F 32G X 4 MM MISC USE DAILY AS DIRECTED 100 each 3   carvedilol (COREG) 12.5 MG tablet TAKE 1 TABLET (12.'5MG'$  TOTAL) BY MOUTH TWICE A DAY WITH MEALS 180 tablet 1   Dulaglutide (TRULICITY) 4.5 EV/0.3JK SOPN Inject 4.5 mg as directed once a week. 6 mL 3   furosemide (LASIX) 20 MG tablet TAKE 1 TABLET (20 MG TOTAL) BY MOUTH DAILY AS NEEDED (FOR SHORTNESS OF BREATH AND SWELLING.). 30 tablet 0   glucose blood (FREESTYLE LITE) test strip 1 each by Other route 2 (two) times daily. And lancets 2/day 200 each 3   hydrALAZINE (APRESOLINE) 50 MG tablet Take 1 tablet by mouth 2 (two) times daily.     insulin detemir (LEVEMIR FLEXTOUCH) 100 UNIT/ML FlexPen Inject 28 Units into the skin every morning. And pen needles 1/day 30 mL 3   Lancets (FREESTYLE) lancets Use to monitor glucose levels BID; E11.22 100 each 12   nitroGLYCERIN (NITROSTAT) 0.4 MG SL tablet Place 1 tablet (0.4 mg total) under the tongue every 5 (five) minutes x 3 doses as needed for chest pain. Don't take within 24 hrs of Cialis 25 tablet 0   Vitamin D, Ergocalciferol, (DRISDOL) 1.25 MG (50000 UNIT) CAPS capsule Take 1 capsule (50,000 Units total) by mouth every Monday. 5 capsule 2   No current facility-administered medications on file prior to visit.   No Known Allergies Family History  Problem Relation Age of Onset   Diabetes Mother    Diabetes Father    Hypertension Father    Diabetes Maternal Grandfather    Diabetes Paternal Grandfather    Diabetes Brother    Hypertension Brother     PE: BP (!) 136/90 (BP Location: Left Arm, Patient Position: Sitting, Cuff Size: Normal)   Pulse 90   Ht '6\' 3"'$  (1.905 m)   Wt 250 lb 6.4 oz (113.6 kg)   SpO2 96%   BMI 31.30 kg/m  Wt Readings from Last 3 Encounters:  03/14/22 250 lb 6.4 oz (113.6 kg)  02/22/22 250 lb (113.4 kg)  02/13/22 250 lb (113.4 kg)   Constitutional: overweight, in  NAD Eyes: no exophthalmos ENT: moist mucous membranes, no thyromegaly, no cervical lymphadenopathy Cardiovascular: RRR, No MRG Respiratory: CTA B Musculoskeletal: no deformities Skin: moist, warm, no rashes Neurological: no tremor with outstretched hands  Diabetic Foot Exam - Simple   Simple Foot Form Diabetic Foot exam was performed with the following findings: Yes 03/14/2022 11:37 AM  Visual Inspection No deformities, no ulcerations, no other skin breakdown  bilaterally: Yes Sensation Testing See comments: Yes Pulse Check Posterior Tibialis and Dorsalis pulse intact bilaterally: Yes Comments Decreased sensation in forefeet. Indurated, deformed,  halluceal toenails    ASSESSMENT: 1. 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 HL  PLAN:  1. Patient with long-standing, uncontrolled diabetes, on injectable antidiabetic regimen with weekly GLP-1 receptor agonist and also long-acting insulin, with still poor control.  Latest HbA1c was above target, at 7.9%, 6 months ago.  At today's visit, HbA1c is 7.5% (lower). -At today's visit, we reviewed his blood sugars at home.  They are above target in the morning, but at goal at bedtime.  He tells me that he is trying to stop eating anything after 9 PM.  We discussed that ideally he would stop eating earlier, but he goes to bed late.   -We discussed about improving his diet.  He still eats fried foods, and we discussed stopping these as these can greatly elevate his sugars also, he has snacks throughout the day and I advised him to eat high glycemic index snacks/fruit at the end of the meal, and is alert, rather than between meals.  -He is taking his Levemir in the morning and I advised him to try to move this at night to help with blood sugars in the morning.  We did talk about increasing the dose, however, since he does need a refill for Trulicity, I advised him to try to  obtain Ozempic, for stronger effect on the blood sugars.  Discussed that this is another medication in the same class that he can expect the same type of side effects for Trulicity.  I recommended a 1 mg dose weekly.  We can increase the dose to 2 mg, if needed. If we cannot start Ozempic, will continue Trulicity at 4.5 mg weekly but I did advise him to increase the Levemir dose (see below). -Ultimately, due to his decreased ejection fraction and his CKD, we discussed about possibly starting an SGLT2 inhibitor.  However, I advised him to check with his nephrologist whether this would be acceptable, for his decreased kidney function. - I suggested to:  Patient Instructions  Please try to change Trulicity to: - Ozmepic 1 mg weekly   Move: - Levemir 28 units to bedtime  If you are not able to start Ozempic, continue the same dose of Trulicity but increase Levemir ti 32-34 units at night.  Please discuss with nephrology if we can use: [place] or Jardiance.  Please return in 3-4 months.  - check sugars at different times of the day - check 2x a day, rotating checks - discussed about CBG targets for treatment: 80-130 mg/dL before meals and <180 mg/dL after meals; target HbA1c <7%. - given foot care handout  - given instructions for hypoglycemia management "15-15 rule"  - advised for yearly eye exams  - foot exam performed today - Return to clinic in 3-4 mo with sugar log   2. HL - Reviewed latest lipid panel from 11/2021: LDL above our target of less than 55, the rest of the fractions at goal Lab Results  Component Value Date   CHOL 149 11/30/2021   HDL 45.50 11/30/2021   LDLCALC 87 11/30/2021   LDLDIRECT 164.0 07/15/2014   TRIG 80.0 11/30/2021   CHOLHDL 3 11/30/2021  - Continues Lipitor 40 mg daily without side effects.  - Total time spent for the visit: 40 min, in precharting, post-charting,  reviewing Dr. Cordelia Pen last note, obtaining medical information from the chart and from the  pt, reviewing his  previous labs, evaluations, and treatments, reviewing his symptoms, counseling him about his diabetes (please see the discussed topics above), and developing a plan to further treat it.  Philemon Kingdom, MD PhD [facility]

## 2022-03-18 ENCOUNTER — Encounter: Payer: 59 | Admitting: Physical Therapy

## 2022-03-18 ENCOUNTER — Other Ambulatory Visit (HOSPITAL_COMMUNITY): Payer: Self-pay

## 2022-03-21 ENCOUNTER — Ambulatory Visit: Payer: 59

## 2022-03-21 DIAGNOSIS — M5416 Radiculopathy, lumbar region: Secondary | ICD-10-CM

## 2022-03-21 DIAGNOSIS — M6281 Muscle weakness (generalized): Secondary | ICD-10-CM

## 2022-03-21 DIAGNOSIS — R252 Cramp and spasm: Secondary | ICD-10-CM

## 2022-03-21 DIAGNOSIS — R2689 Other abnormalities of gait and mobility: Secondary | ICD-10-CM

## 2022-03-21 NOTE — Therapy (Signed)
OUTPATIENT PHYSICAL THERAPY TREATMENT NOTE   Patient Name: Alexander [name] MRN: [id number] DOB:[birth year], 51 y.o., male Today's Date: 03/21/2022   PT End of Session - 03/21/22 1019     Visit Number 4    Date for PT Re-Evaluation 04/15/22    Authorization Type UHC    PT Start Time 5790    PT Stop Time 3833    PT Time Calculation (min) 41 min    Activity Tolerance Patient tolerated treatment well    Behavior During Therapy [facility] for tasks assessed/performed                Past Medical History:  Diagnosis Date   CHEST PAIN 08/28/2009   DIABETES MELLITUS, TYPE II 03/20/2007   HYPERCHOLESTEROLEMIA 07/21/2008   HYPERTENSION 03/20/2007   Hypertensive urgency 03/27/2015   Left ventricular dysfunction 03/28/2015   EF 40-45%, no WMA, grade 2 diastolic dysfunction   Renal disorder    Past Surgical History:  Procedure Laterality Date   KNEE ARTHROSCOPY Right    Patient Active Problem List   Diagnosis Date Noted   Aortic atherosclerosis ([place]) 02/25/2022   Acute left-sided low back pain with left-sided sciatica 02/24/2022   GERD (gastroesophageal reflux disease) 10/26/2021   Colon cancer screening 10/26/2021   Bilateral hearing loss 05/10/2021   Eustachian tube dysfunction, left 05/10/2021   Persistent proteinuria 11/16/2019   Hyperkalemia 08/27/2018   Hyperphosphatemia 04/09/2018   Orthostasis 12/16/2017   Chest pain 04/04/2017   Normocytic anemia 04/04/2017   AKI (acute kidney injury) ([place]) 11/14/2016   Routine general medical examination at a health care facility 10/11/2016   Labile hypertension 07/12/2016   Dizziness 11/28/2015   Vitamin D deficiency 11/09/2015   Anemia due to stage 4 chronic kidney disease ([place]) 11/09/2015   Benign hypertension with CKD (chronic kidney disease) stage IV ([place]) 09/29/2015   Diabetic nephropathy ([place]) 03/29/2015   Diabetes mellitus with stage 4 chronic kidney disease ([place]) 03/29/2015   Acute renal failure ([place])    Dyslipidemia     Hypertensive urgency    Hypertensive heart disease with heart failure ([place]) 01/20/2015   Hypogonadism male 05/27/2012   Essential hypertension 03/20/2007    PCP: Hoyt Koch, MD   REFERRING PROVIDER: Girtha Rm, NP-C  REFERRING DIAG: [id number] (ICD-10-CM) - Acute left-sided low back pain with left-sided sciatica  Rationale for Evaluation and Treatment Rehabilitation  THERAPY DIAG:  Radiculopathy, lumbar region  Muscle weakness (generalized)  Other abnormalities of gait and mobility  Cramp and spasm  ONSET DATE: 2-3 weeks ago  SUBJECTIVE:  SUBJECTIVE STATEMENT: Pt notes his pain has been worse the past few days, thinking about going back to doctor to discuss other options.   PERTINENT HISTORY:  Rt knee scope, HTN, DM, heart failure 2016, kidney disease  PAIN:  Are you having pain? Yes: NPRS scale: 4-5/10 Pain location: left lower back and then post leg to just below the knee Pain description: sharpness, shooting, constant cramp Aggravating factors: sitting, driving Relieving factors: lying seat all the way back   PRECAUTIONS: None  WEIGHT BEARING RESTRICTIONS No  FALLS:  Has patient fallen in last 6 months? No  LIVING ENVIRONMENT: Lives with: lives with their family Lives in: House/apartment Stairs: Yes: Internal: 15 steps; can reach both Has following equipment at home: None  OCCUPATION: English as a second language teacher, sits mostly, also musician in church  PLOF: [place] it.   OBJECTIVE:   DIAGNOSTIC FINDINGS:  XR 02/25/22 Normal anatomic alignment. No evidence for acute fracture or dislocation. Preservation of the vertebral body heights. L4-5 degenerative disc disease. L5-S1 degenerative disc disease. L4-5 and L5-S1 facet degenerative changes. Extensive  aortic atherosclerosis  PATIENT SURVEYS:  Modified Oswestry 48% disability   SCREENING FOR RED FLAGS: Bowel or bladder incontinence: No Spinal tumors: No Cauda equina syndrome: No Compression fracture: No Abdominal aneurysm: No  COGNITION:  Overall cognitive status: Within functional limits for tasks assessed     SENSATION: WFL  MUSCLE LENGTH: Marked bil HS tightness (left limited by neural tension)  POSTURE: rounded shoulders, decreased lumbar lordosis, flexed trunk , and weight shift right  PALPATION: TTP at let gluteals, lumbar, lateral thigh, gastroc Spinal mobility: decreased UPA L4/5, L5/S1 left   LUMBAR ROM:   Active  A/PROM  eval  Flexion 25%*  Extension 50%  Right lateral flexion WNL  Left lateral flexion WNL  Right rotation full  Left rotation 90%   (Blank rows = not tested)  LOWER EXTREMITY ROM:     Right LE WFL Left: limited by pain in hip flexion, knee ext/flex, ankle DF, else [place]  LOWER EXTREMITY MMT:    MMT Right eval Left eval  Hip flexion 4+ 4+  Hip extension 5 4  Hip abduction 5 4*  Hip adduction    Hip internal rotation 4+ 4-*  Hip external rotation 4+ 4-*  Knee flexion 5 4-*  Knee extension 5 4*  Ankle dorsiflexion 5 4-  Ankle plantarflexion    Ankle inversion 5 4-  Ankle eversion 5 4-   (Blank rows = not tested)  LUMBAR SPECIAL TESTS:  Straight leg raise test: Positive and Slump test: Positive  FUNCTIONAL TESTS:  Prone on elbows decreases pain; PPU 2x10 centralizes pain to left low back/hip   GAIT: Distance walked: 40 Assistive device utilized: None Level of assistance: Complete Independence Comments: antalgic with increased knee flexion left, trunk lean left    TODAY'S TREATMENT  03/21/22 Treadmill 1 mph x 67min Quadruped cat/cow 10x 5 sec hold each way Prone press up 2x30 sec Supine pelvic tilt x 10 both directions Supine L figure 4 stretch 2x30"  STM to L glute med and hamstrings Long leg distraction L  side  03/13/22 Bike L2x35min Press up 3x15 sec Prone HS curls x 10 bil Prone hip extension x 5 bil Quadruped cat/cow x 10  Quadruped glute kicks 2x5 bil  Therapeutic Activity: Review lifting techniques, provided instruction on proper squat technique with hip hinge and keeping neutral spine  Manual Therapy: STM to L glute med/min area  03/07/22 Press ups 2 x  15 HS stretch with strap x 30 sec bil Nerve glide in LAQ position 5 sec on/off x 10 Seated fig 4 2x 30 sec bil   Manual Therapy: UPA mob to L L2/3 to L5/S1 Skilled palpation and monitoring of soft tissues during DN STM to left gluteals and left lumbar in [facility]  Trigger Point Dry-Needling  Treatment instructions: Expect mild to moderate muscle soreness. S/S of pneumothorax if dry needled over a lung field, and to seek immediate medical attention should they occur. Patient verbalized understanding of these instructions and education.  Patient Consent Given: Yes Education handout provided: Yes Muscles treated: glute min/med/max Electrical stimulation performed: No Parameters: N/A Treatment response/outcome: Twitch Response Elicited and Palpable Increase in Muscle Length   Eval: See pt ed   PATIENT EDUCATION:  Education details: DN education and aftercare Person educated: Patient Education method: Explanation, Demonstration, Verbal cues, and Handouts Education comprehension: verbalized understanding and returned demonstration   HOME EXERCISE PROGRAM: Access Code: [email or web address]  ASSESSMENT:  CLINICAL IMPRESSION: Pt arrived noting that the past few days he noticed more pain. Pt just seems to be tight with pelvic tilts esp posterior. His L hamstrings remain extremely tight with stretching. Cues required throughout session to avoid tensing up with stretches and to keep breathing to allow for more benefit with exercises. Pt has met all STGs as of today. I believe he would benefit from some DN next visit.     OBJECTIVE  IMPAIRMENTS Abnormal gait, decreased activity tolerance, decreased ROM, decreased strength, hypomobility, increased muscle spasms, impaired flexibility, improper body mechanics, and pain.   ACTIVITY LIMITATIONS bending, squatting, sleeping, transfers, and locomotion level  PARTICIPATION LIMITATIONS:  pain with most ADLS  PERSONAL FACTORS Time since onset of injury/illness/exacerbation and 3+ comorbidities: HTN, DM, kidney disease  are also affecting patient's functional outcome.   REHAB POTENTIAL: Excellent  CLINICAL DECISION MAKING: Stable/uncomplicated  EVALUATION COMPLEXITY: Low   GOALS: Goals reviewed with patient? Yes  SHORT TERM GOALS: Target date: 03/18/2022 (Remove Blue Hyperlink)  Patient will be independent with initial HEP.  Baseline:  Goal status: MET  2.  Patient will report regular centralization of radicular symptoms with prone press ups.  Baseline:  Goal status: MET  3.  Pt educated regarding ADL modifications and proper body mechanics Baseline:  Goal status: MET   LONG TERM GOALS: Target date: 04/15/2022  (Remove Blue Hyperlink)  Patient will be independent with advanced/ongoing HEP to improve outcomes and carryover.  Baseline:  Goal status: IN PROGRESS  2.  Patient will report 75% improvement in low back pain to improve QOL.  Baseline:  Goal status: IN PROGRESS  3.  Patient to demonstrate ability to achieve and maintain good spinal alignment/posturing and body mechanics needed for daily activities. Baseline:  Goal status: IN PROGRESS  4.  Patient will demonstrate functional pain free lumbar ROM to perform ADLs.   Baseline:  Goal status: IN PROGRESS  5.  Patient will demonstrate improved functional strength as demonstrated by ability to squat for correct body mechanics and lifting. Baseline:  Goal status: IN PROGRESS  6.  Patient will report 10% or less disability on LEFS to demonstrate improved functional ability.  Baseline: 48% Goal status: IN  PROGRESS   7.  Patient will reports improved tolerance of sitting and driving by 97% or more. Baseline:  Goal status: IN PROGRESS  8.  Patient able to safely amb community distances with a non-antalgic gait pattern Baseline:  Goal status: IN PROGRESS    PLAN: PT FREQUENCY:  2x/week  PT DURATION: 6 weeks  PLANNED INTERVENTIONS: Therapeutic exercises, Therapeutic activity, Neuromuscular re-education, Patient/Family education, Self Care, Joint mobilization, Joint manipulation, Aquatic Therapy, Dry Needling, Electrical stimulation, Spinal manipulation, Spinal mobilization, Cryotherapy, Moist heat, Taping, Traction, Ionotophoresis 4mg /ml Dexamethasone, and Manual therapy.  PLAN FOR NEXT SESSION:  Review ADL modification and lifting techniques (h/o already provided), core strengthening with extension bias, Manual and modalities prn.   Artist Pais, PTA 03/21/2022, 10:20 AM

## 2022-03-24 NOTE — Therapy (Signed)
OUTPATIENT PHYSICAL THERAPY TREATMENT NOTE   Patient Name: DURREL MCNEE MRN: [id number] DOB:10-17-1970, 51 y.o., male Today's Date: 03/25/2022   PT End of Session - 03/25/22 0940     Visit Number 5    Date for PT Re-Evaluation 04/15/22    Authorization Type UHC    PT Start Time 8938    PT Stop Time 1017    PT Time Calculation (min) 42 min    Activity Tolerance Patient tolerated treatment well    Behavior During Therapy [facility] for tasks assessed/performed                 Past Medical History:  Diagnosis Date   CHEST PAIN 08/28/2009   DIABETES MELLITUS, TYPE II 03/20/2007   HYPERCHOLESTEROLEMIA 07/21/2008   HYPERTENSION 03/20/2007   Hypertensive urgency 03/27/2015   Left ventricular dysfunction 03/28/2015   EF 40-45%, no WMA, grade 2 diastolic dysfunction   Renal disorder    Past Surgical History:  Procedure Laterality Date   KNEE ARTHROSCOPY Right    Patient Active Problem List   Diagnosis Date Noted   Aortic atherosclerosis ([place]) 02/25/2022   Acute left-sided low back pain with left-sided sciatica 02/24/2022   GERD (gastroesophageal reflux disease) 10/26/2021   Colon cancer screening 10/26/2021   Bilateral hearing loss 05/10/2021   Eustachian tube dysfunction, left 05/10/2021   Persistent proteinuria 11/16/2019   Hyperkalemia 08/27/2018   Hyperphosphatemia 04/09/2018   Orthostasis 12/16/2017   Chest pain 04/04/2017   Normocytic anemia 04/04/2017   AKI (acute kidney injury) ([place]) 11/14/2016   Routine general medical examination at a health care facility 10/11/2016   Labile hypertension 07/12/2016   Dizziness 11/28/2015   Vitamin D deficiency 11/09/2015   Anemia due to stage 4 chronic kidney disease ([place]) 11/09/2015   Benign hypertension with CKD (chronic kidney disease) stage IV ([place]) 09/29/2015   Diabetic nephropathy ([place]) 03/29/2015   Diabetes mellitus with stage 4 chronic kidney disease ([place]) 03/29/2015   Acute renal failure ([place])    Dyslipidemia     Hypertensive urgency    Hypertensive heart disease with heart failure ([place]) 01/20/2015   Hypogonadism male 05/27/2012   Essential hypertension 03/20/2007    PCP: Hoyt Koch, MD   REFERRING PROVIDER: Girtha Rm, NP-C  REFERRING DIAG: [phone] (ICD-10-CM) - Acute left-sided low back pain with left-sided sciatica  Rationale for Evaluation and Treatment Rehabilitation  THERAPY DIAG:  Radiculopathy, lumbar region  Muscle weakness (generalized)  Other abnormalities of gait and mobility  Cramp and spasm  ONSET DATE: 2-3 weeks ago  SUBJECTIVE:  SUBJECTIVE STATEMENT: Patient reporting increased pain this morning. Usually worse in the morning and sometimes when I get up in the night I can't get back to sleep. Also hurts getting out of car. Puts the seat back some for relief but generally sits upright.  PAIN:  Are you having pain? Yes: NPRS scale: 4-5/10 Pain location: left lower back and then post leg to just below the knee Pain description: sharpness, shooting, constant cramp Aggravating factors: sitting, driving Relieving factors: lying seat all the way back  PERTINENT HISTORY:  Rt knee scope, HTN, DM, heart failure 2016, kidney disease  PRECAUTIONS: None  WEIGHT BEARING RESTRICTIONS No  FALLS:  Has patient fallen in last 6 months? No  LIVING ENVIRONMENT: Lives with: lives with their family Lives in: House/apartment Stairs: Yes: Internal: 15 steps; can reach both Has following equipment at home: None  OCCUPATION: English as a second language teacher, sits mostly, also musician in church  PLOF: [place] it.   OBJECTIVE:   DIAGNOSTIC FINDINGS:  XR 02/25/22 Normal anatomic alignment. No evidence for acute fracture or dislocation. Preservation of the vertebral body heights.  L4-5 degenerative disc disease. L5-S1 degenerative disc disease. L4-5 and L5-S1 facet degenerative changes. Extensive aortic atherosclerosis  PATIENT SURVEYS:  Modified Oswestry 48% disability   SCREENING FOR RED FLAGS: Bowel or bladder incontinence: No Spinal tumors: No Cauda equina syndrome: No Compression fracture: No Abdominal aneurysm: No  COGNITION:  Overall cognitive status: Within functional limits for tasks assessed     SENSATION: WFL  MUSCLE LENGTH: Marked bil HS tightness (left limited by neural tension)  POSTURE: rounded shoulders, decreased lumbar lordosis, flexed trunk , and weight shift right  PALPATION: TTP at let gluteals, lumbar, lateral thigh, gastroc Spinal mobility: decreased UPA L4/5, L5/S1 left   LUMBAR ROM:   Active  A/PROM  eval   Flexion 25%* 75%  Extension 50% 75%  Right lateral flexion WNL   Left lateral flexion WNL   Right rotation full   Left rotation 90%    (Blank rows = not tested)  LOWER EXTREMITY ROM:     Right LE WFL Left: limited by pain in hip flexion, knee ext/flex, ankle DF, else [facility]  LOWER EXTREMITY MMT:    MMT Right eval Left eval  Hip flexion 4+ 4+  Hip extension 5 4  Hip abduction 5 4*  Hip adduction    Hip internal rotation 4+ 4-*  Hip external rotation 4+ 4-*  Knee flexion 5 4-*  Knee extension 5 4*  Ankle dorsiflexion 5 4-  Ankle plantarflexion    Ankle inversion 5 4-  Ankle eversion 5 4-   (Blank rows = not tested)  LUMBAR SPECIAL TESTS:  Straight leg raise test: Positive and Slump test: Positive  FUNCTIONAL TESTS:  Prone on elbows decreases pain; PPU 2x10 centralizes pain to left low back/hip   GAIT: Distance walked: 40 Assistive device utilized: None Level of assistance: Complete Independence Comments: antalgic with increased knee flexion left, trunk lean left    TODAY'S TREATMENT  03/25/22  Therex: Elliptical L2 x 5 min Lumbar ext at wall 5 sec hold x 10 Cat/cow 5 x 5 sec hold Supine  HS stretch passive left x 30 sec  Dynamic HS stretch (knee ext/flex in supine) x 10, then passive using strap x 30  sec left only  Manual Therapy: UPA mobs to left lumbar Skilled palpation and monitoring of soft tissues during DN STM to left gluteals, piriformis and lateral gastroc  Trigger Point Dry-Needling  Treatment  instructions: Expect mild to moderate muscle soreness. S/S of pneumothorax if dry needled over a lung field, and to seek immediate medical attention should they occur. Patient verbalized understanding of these instructions and education.  Patient Consent Given: Yes Education handout provided: Yes Muscles treated: glute min/med/max/piriformis/left lateral gastroc Electrical stimulation performed: No Parameters: N/A Treatment response/outcome: Twitch Response Elicited and Palpable Increase in Muscle Length    03/21/22 Treadmill 1 mph x Quadruped cat/cow 10x 5 sec hold each way Prone press up 2x30 sec Supine pelvic tilt x 10 both directions Supine L figure 4 stretch 2x30"  STM to L glute med and hamstrings Long leg distraction L side  03/13/22 Bike L2x20min Press up 3x15 sec Prone HS curls x 10 bil Prone hip extension x 5 bil Quadruped cat/cow x 10  Quadruped glute kicks 2x5 bil  Therapeutic Activity: Review lifting techniques, provided instruction on proper squat technique with hip hinge and keeping neutral spine  Manual Therapy: STM to L glute med/min area  03/07/22 Press ups 2 x 15 HS stretch with strap x 30 sec bil Nerve glide in LAQ position 5 sec on/off x 10 Seated fig 4 2x 30 sec bil   Manual Therapy: UPA mob to L L2/3 to L5/S1 Skilled palpation and monitoring of soft tissues during DN STM to left gluteals and left lumbar in [facility]  Trigger Point Dry-Needling  Treatment instructions: Expect mild to moderate muscle soreness. S/S of pneumothorax if dry needled over a lung field, and to seek immediate medical attention should they occur. Patient  verbalized understanding of these instructions and education.  Patient Consent Given: Yes Education handout provided: Yes Muscles treated: glute min/med/max Electrical stimulation performed: No Parameters: N/A Treatment response/outcome: Twitch Response Elicited and Palpable Increase in Muscle Length   Eval: See pt ed   PATIENT EDUCATION:  Education details: HEP update 03/25/22, advised increased frequency of press ups. Person educated: Patient Education method: Explanation, Demonstration, Verbal cues, and Handouts Education comprehension: verbalized understanding and returned demonstration   HOME EXERCISE PROGRAM: Access Code: [email or web address]  ASSESSMENT:  CLINICAL IMPRESSION: [place] reports increased soreness in the morning and when getting up in the middle of the night. PT advised trying prone over one pillow for sleeping and/or performing more press ups. Jarone had increased spinal mobility with cat/cow today. New HS stretch issued in supine (dynamic and passive). Pt likely is aggravating sx with sitting and lying on soft couch/chairs at home. PT advised being more diligent about posture to see effects. Good response to DN today in gluteals and lateral gastroc. Decreased tissue tension noted in lumbar vs previous visit with this PT. He reports ongoing difficulty with left DF control with gait. This should be addressed next visit. Raliegh continues to demonstrate potential for improvement and would benefit from continued skilled therapy to address impairments.    OBJECTIVE IMPAIRMENTS Abnormal gait, decreased activity tolerance, decreased ROM, decreased strength, hypomobility, increased muscle spasms, impaired flexibility, improper body mechanics, and pain.   ACTIVITY LIMITATIONS bending, squatting, sleeping, transfers, and locomotion level  PARTICIPATION LIMITATIONS:  pain with most ADLS  PERSONAL FACTORS Time since onset of injury/illness/exacerbation and 3+ comorbidities: HTN, DM,  kidney disease  are also affecting patient's functional outcome.   REHAB POTENTIAL: Excellent  CLINICAL DECISION MAKING: Stable/uncomplicated  EVALUATION COMPLEXITY: Low   GOALS: Goals reviewed with patient? Yes  SHORT TERM GOALS: Target date: 03/18/2022 (Remove Blue Hyperlink)  Patient will be independent with initial HEP.  Baseline:  Goal status: MET  2.  Patient will report regular centralization of radicular symptoms with prone press ups.  Baseline:  Goal status: MET  3.  Pt educated regarding ADL modifications and proper body mechanics Baseline:  Goal status: MET   LONG TERM GOALS: Target date: 04/15/2022  (Remove Blue Hyperlink)  Patient will be independent with advanced/ongoing HEP to improve outcomes and carryover.  Baseline:  Goal status: IN PROGRESS  2.  Patient will report 75% improvement in low back pain to improve QOL.  Baseline:  Goal status: IN PROGRESS  3.  Patient to demonstrate ability to achieve and maintain good spinal alignment/posturing and body mechanics needed for daily activities. Baseline:  Goal status: IN PROGRESS  4.  Patient will demonstrate functional pain free lumbar ROM to perform ADLs.   Baseline:  Goal status: IN PROGRESS  5.  Patient will demonstrate improved functional strength as demonstrated by ability to squat for correct body mechanics and lifting. Baseline:  Goal status: IN PROGRESS  6.  Patient will report 10% or less disability on LEFS to demonstrate improved functional ability.  Baseline: 48% Goal status: IN PROGRESS   7.  Patient will reports improved tolerance of sitting and driving by 40% or more. Baseline:  Goal status: IN PROGRESS  8.  Patient able to safely amb community distances with a non-antalgic gait pattern Baseline:  Goal status: IN PROGRESS    PLAN: PT FREQUENCY: 2x/week  PT DURATION: 6 weeks  PLANNED INTERVENTIONS: Therapeutic exercises, Therapeutic activity, Neuromuscular re-education,  Patient/Family education, Self Care, Joint mobilization, Joint manipulation, Aquatic Therapy, Dry Needling, Electrical stimulation, Spinal manipulation, Spinal mobilization, Cryotherapy, Moist heat, Taping, Traction, Ionotophoresis 4mg /ml Dexamethasone, and Manual therapy.  PLAN FOR NEXT SESSION:  Look at [facility] control with gait,   core strengthening with extension bias, Manual and modalities prn.   Serrena Linderman, PT 03/25/2022, 12:39 PM

## 2022-03-25 ENCOUNTER — Encounter: Payer: Self-pay | Admitting: Physical Therapy

## 2022-03-25 ENCOUNTER — Ambulatory Visit: Payer: 59 | Admitting: Physical Therapy

## 2022-03-25 DIAGNOSIS — M6281 Muscle weakness (generalized): Secondary | ICD-10-CM

## 2022-03-25 DIAGNOSIS — R2689 Other abnormalities of gait and mobility: Secondary | ICD-10-CM

## 2022-03-25 DIAGNOSIS — M5416 Radiculopathy, lumbar region: Secondary | ICD-10-CM | POA: Diagnosis not present

## 2022-03-25 DIAGNOSIS — R252 Cramp and spasm: Secondary | ICD-10-CM

## 2022-03-28 ENCOUNTER — Ambulatory Visit: Payer: 59 | Admitting: Physical Therapy

## 2022-03-29 ENCOUNTER — Telehealth: Payer: Self-pay

## 2022-03-29 ENCOUNTER — Other Ambulatory Visit (HOSPITAL_COMMUNITY): Payer: Self-pay

## 2022-03-29 NOTE — Telephone Encounter (Signed)
Inbound call from [employer] requesting a PA for Ozempic 1 mg. Call back number is  [phone] Reference Key: [id number]

## 2022-04-01 ENCOUNTER — Encounter: Payer: Self-pay | Admitting: Physical Therapy

## 2022-04-01 ENCOUNTER — Ambulatory Visit: Payer: 59 | Admitting: Physical Therapy

## 2022-04-01 DIAGNOSIS — R2689 Other abnormalities of gait and mobility: Secondary | ICD-10-CM

## 2022-04-01 DIAGNOSIS — R252 Cramp and spasm: Secondary | ICD-10-CM

## 2022-04-01 DIAGNOSIS — M6281 Muscle weakness (generalized): Secondary | ICD-10-CM

## 2022-04-01 DIAGNOSIS — M5416 Radiculopathy, lumbar region: Secondary | ICD-10-CM

## 2022-04-01 NOTE — Therapy (Signed)
OUTPATIENT PHYSICAL THERAPY TREATMENT NOTE   Patient Name: Alexander [name] MRN: [id number] DOB:20-Nov-1970, 51 y.o., male Today's Date: 04/01/2022   PT End of Session - 04/01/22 0939     Visit Number 6    Date for PT Re-Evaluation 04/15/22    Authorization Type UHC    PT Start Time 3785    PT Stop Time 1027    PT Time Calculation (min) 49 min    Activity Tolerance Patient tolerated treatment well    Behavior During Therapy [facility] for tasks assessed/performed                 Past Medical History:  Diagnosis Date   CHEST PAIN 08/28/2009   DIABETES MELLITUS, TYPE II 03/20/2007   HYPERCHOLESTEROLEMIA 07/21/2008   HYPERTENSION 03/20/2007   Hypertensive urgency 03/27/2015   Left ventricular dysfunction 03/28/2015   EF 40-45%, no WMA, grade 2 diastolic dysfunction   Renal disorder    Past Surgical History:  Procedure Laterality Date   KNEE ARTHROSCOPY Right    Patient Active Problem List   Diagnosis Date Noted   Aortic atherosclerosis ([place]) 02/25/2022   Acute left-sided low back pain with left-sided sciatica 02/24/2022   GERD (gastroesophageal reflux disease) 10/26/2021   Colon cancer screening 10/26/2021   Bilateral hearing loss 05/10/2021   Eustachian tube dysfunction, left 05/10/2021   Persistent proteinuria 11/16/2019   Hyperkalemia 08/27/2018   Hyperphosphatemia 04/09/2018   Orthostasis 12/16/2017   Chest pain 04/04/2017   Normocytic anemia 04/04/2017   AKI (acute kidney injury) ([place]) 11/14/2016   Routine general medical examination at a health care facility 10/11/2016   Labile hypertension 07/12/2016   Dizziness 11/28/2015   Vitamin D deficiency 11/09/2015   Anemia due to stage 4 chronic kidney disease ([place]) 11/09/2015   Benign hypertension with CKD (chronic kidney disease) stage IV ([place]) 09/29/2015   Diabetic nephropathy ([place]) 03/29/2015   Diabetes mellitus with stage 4 chronic kidney disease ([place]) 03/29/2015   Acute renal failure ([place])    Dyslipidemia     Hypertensive urgency    Hypertensive heart disease with heart failure ([place]) 01/20/2015   Hypogonadism male 05/27/2012   Essential hypertension 03/20/2007    PCP: Hoyt Koch, MD   REFERRING PROVIDER: Girtha Rm, NP-C  REFERRING DIAG: [phone] (ICD-10-CM) - Acute left-sided low back pain with left-sided sciatica  Rationale for Evaluation and Treatment Rehabilitation  THERAPY DIAG:  Radiculopathy, lumbar region  Muscle weakness (generalized)  Other abnormalities of gait and mobility  Cramp and spasm  ONSET DATE: 2-3 weeks ago  SUBJECTIVE:  SUBJECTIVE STATEMENT: Patient reporting increased pain this morning, driving daughter to school.  Trying to do exercises 2x/day.  Pain going down his leg this morning, now more in hip. 8/10 this morning, took tylenol.   PAIN:  Are you having pain? Yes: NPRS scale: 5/10 Pain location: left lower back and then post leg to hip knee Pain description: sharpness, shooting, constant cramp Aggravating factors: sitting, driving Relieving factors: lying seat all the way back  PERTINENT HISTORY:  Rt knee scope, HTN, DM, heart failure 2016, kidney disease  PRECAUTIONS: None  WEIGHT BEARING RESTRICTIONS No  FALLS:  Has patient fallen in last 6 months? No  LIVING ENVIRONMENT: Lives with: lives with their family Lives in: House/apartment Stairs: Yes: Internal: 15 steps; can reach both Has following equipment at home: None  OCCUPATION: English as a second language teacher, sits mostly, also musician in church  PLOF: [place] it.   OBJECTIVE:   DIAGNOSTIC FINDINGS:  XR 02/25/22 Normal anatomic alignment. No evidence for acute fracture or dislocation. Preservation of the vertebral body heights. L4-5 degenerative disc disease. L5-S1 degenerative  disc disease. L4-5 and L5-S1 facet degenerative changes. Extensive aortic atherosclerosis  PATIENT SURVEYS:  Modified Oswestry 48% disability   SCREENING FOR RED FLAGS: Bowel or bladder incontinence: No Spinal tumors: No Cauda equina syndrome: No Compression fracture: No Abdominal aneurysm: No  COGNITION:  Overall cognitive status: Within functional limits for tasks assessed     SENSATION: WFL  MUSCLE LENGTH: Marked bil HS tightness (left limited by neural tension)  POSTURE: rounded shoulders, decreased lumbar lordosis, flexed trunk , and weight shift right  PALPATION: TTP at let gluteals, lumbar, lateral thigh, gastroc Spinal mobility: decreased UPA L4/5, L5/S1 left   LUMBAR ROM:   Active  AROM  eval 03/25/2022  Flexion 25%* 75%  Extension 50% 75%  Right lateral flexion WNL   Left lateral flexion WNL   Right rotation full   Left rotation 90%    (Blank rows = not tested)  LOWER EXTREMITY ROM:     Right LE WFL Left: limited by pain in hip flexion, knee ext/flex, ankle DF, else [facility]  LOWER EXTREMITY MMT:    MMT Right eval Left eval  Hip flexion 4+ 4+  Hip extension 5 4  Hip abduction 5 4*  Hip adduction    Hip internal rotation 4+ 4-*  Hip external rotation 4+ 4-*  Knee flexion 5 4-*  Knee extension 5 4*  Ankle dorsiflexion 5 4-  Ankle plantarflexion    Ankle inversion 5 4-  Ankle eversion 5 4-   (Blank rows = not tested)  LUMBAR SPECIAL TESTS:  Straight leg raise test: Positive and Slump test: Positive  FUNCTIONAL TESTS:  Prone on elbows decreases pain; PPU 2x10 centralizes pain to left low back/hip   GAIT: Distance walked: 40 Assistive device utilized: None Level of assistance: Complete Independence Comments: antalgic with increased knee flexion left, trunk lean left    TODAY'S TREATMENT  04/01/2022 Therapeutic Exercise: to improve strength and mobility.  Demo, verbal and tactile cues throughout for technique. Bike L2 x 5 min  Standing  side glides - prefers L to wall but increased pain both directions.  Standing extension - increased pain today  Seated flexion with ball Seated sciatic nerve glides LLE x 10  Supine sciatic nerve glides with leg propped on pillow - tolerated better, added to HEP x 10  Pelvic tilts (ant to post) x 10  Bridges (small) x 10  Manual Therapy: to decrease muscle spasm  and pain and improve mobility UPA mobs to left lumbar paraspinals grade 3-4 Skilled palpation and monitoring of soft tissues during DN STM to left gluteals, piriformis  Trigger Point Dry-Needling  Treatment instructions: Expect mild to moderate muscle soreness.  Patient verbalized understanding of these instructions and education.  Patient Consent Given: Yes Education handout provided: Yes Muscles treated: L L4/5 multifidi, L piriformis Electrical stimulation performed: No Parameters: N/A Treatment response/outcome: Twitch Response Elicited and Palpable Increase in Muscle Length   03/25/22  Therex: Elliptical L2 x 5 min Lumbar ext at wall 5 sec hold x 10 Cat/cow 5 x 5 sec hold Supine HS stretch passive left x 30 sec  Dynamic HS stretch (knee ext/flex in supine) x 10, then passive using strap x 30  sec left only  Manual Therapy: UPA mobs to left lumbar Skilled palpation and monitoring of soft tissues during DN STM to left gluteals, piriformis and lateral gastroc  Trigger Point Dry-Needling  Treatment instructions: Expect mild to moderate muscle soreness. S/S of pneumothorax if dry needled over a lung field, and to seek immediate medical attention should they occur. Patient verbalized understanding of these instructions and education.  Patient Consent Given: Yes Education handout provided: Yes Muscles treated: glute min/med/max/piriformis/left lateral gastroc Electrical stimulation performed: No Parameters: N/A Treatment response/outcome: Twitch Response Elicited and Palpable Increase in Muscle  Length    03/21/22 Treadmill 1 mph x 33mn Quadruped cat/cow 10x 5 sec hold each way Prone press up 2x30 sec Supine pelvic tilt x 10 both directions Supine L figure 4 stretch 2x30"  STM to L glute med and hamstrings Long leg distraction L side    PATIENT EDUCATION:  Education details: HEP update 03/25/22, 04/01/22 Person educated: Patient Education method: EConsulting civil engineer Demonstration, Verbal cues, and Handouts Education comprehension: verbalized understanding and returned demonstration   HOME EXERCISE PROGRAM: Access Code: [email or web address]  Exercises Added: - Supine Sciatic Nerve Mob with Leg on Pillow and Head Lift  - 1 x daily - 7 x weekly - 3 sets - 10 reps - Pelvic Tilt  - 1 x daily - 7 x weekly - 3 sets - 10 reps - Supine Bridge  - 1 x daily - 7 x weekly - 3 sets - 10 reps  ASSESSMENT: CLINICAL IMPRESSION: MNettie continues to report increased soreness in the morning and when getting up in the middle of the night, and weakness in L DF.  Reviewed exercises and importance of performing extension exercises frequently but to point of tightness, not pain.  He was concerned of progress, can go ahead and schedule follow-up for end of 6 weeks of physical therapy.  Noted irritation even with seated sciatic nerve glides, improved when done supine.  Also tolerated pelvic tilts and small bridges today, so added to HEP.  Noted tenderness L4/5, patient consented to another trial of DN, tolerated well.  MMonteriuscontinues to demonstrate potential for improvement and would benefit from continued skilled therapy to address impairments.    OBJECTIVE IMPAIRMENTS Abnormal gait, decreased activity tolerance, decreased ROM, decreased strength, hypomobility, increased muscle spasms, impaired flexibility, improper body mechanics, and pain.   ACTIVITY LIMITATIONS bending, squatting, sleeping, transfers, and locomotion level  PARTICIPATION LIMITATIONS:  pain with most ADLS  PERSONAL FACTORS Time since onset of  injury/illness/exacerbation and 3+ comorbidities: HTN, DM, kidney disease  are also affecting patient's functional outcome.   REHAB POTENTIAL: Excellent  CLINICAL DECISION MAKING: Stable/uncomplicated  EVALUATION COMPLEXITY: Low   GOALS: Goals reviewed with patient? Yes  SHORT TERM  GOALS: Target date: 03/18/2022 (Remove Blue Hyperlink)  Patient will be independent with initial HEP.  Baseline:  Goal status: MET  2.  Patient will report regular centralization of radicular symptoms with prone press ups.  Baseline:  Goal status: MET  3.  Pt educated regarding ADL modifications and proper body mechanics Baseline:  Goal status: MET   LONG TERM GOALS: Target date: 04/15/2022  (Remove Blue Hyperlink)  Patient will be independent with advanced/ongoing HEP to improve outcomes and carryover.  Baseline:  Goal status: IN PROGRESS  2.  Patient will report 75% improvement in low back pain to improve QOL.  Baseline:  Goal status: IN PROGRESS  3.  Patient to demonstrate ability to achieve and maintain good spinal alignment/posturing and body mechanics needed for daily activities. Baseline:  Goal status: IN PROGRESS  4.  Patient will demonstrate functional pain free lumbar ROM to perform ADLs.   Baseline:  Goal status: IN PROGRESS  5.  Patient will demonstrate improved functional strength as demonstrated by ability to squat for correct body mechanics and lifting. Baseline:  Goal status: IN PROGRESS  6.  Patient will report 10% or less disability on LEFS to demonstrate improved functional ability.  Baseline: 48% Goal status: IN PROGRESS   7.  Patient will reports improved tolerance of sitting and driving by 47% or more. Baseline:  Goal status: IN PROGRESS  8.  Patient able to safely amb community distances with a non-antalgic gait pattern Baseline:  Goal status: IN PROGRESS    PLAN: PT FREQUENCY: 2x/week  PT DURATION: 6 weeks  PLANNED INTERVENTIONS: Therapeutic  exercises, Therapeutic activity, Neuromuscular re-education, Patient/Family education, Self Care, Joint mobilization, Joint manipulation, Aquatic Therapy, Dry Needling, Electrical stimulation, Spinal manipulation, Spinal mobilization, Cryotherapy, Moist heat, Taping, Traction, Ionotophoresis 64m/ml Dexamethasone, and Manual therapy.  PLAN FOR NEXT SESSION:  [facility] at [facility] with gait,   core strengthening with extension bias, Manual and modalities prn.   ERennie Natter PT, DPT  04/01/2022, 10:38 AM

## 2022-04-03 ENCOUNTER — Ambulatory Visit: Payer: 59 | Admitting: Physical Therapy

## 2022-04-03 ENCOUNTER — Encounter: Payer: Self-pay | Admitting: Physical Therapy

## 2022-04-03 DIAGNOSIS — R252 Cramp and spasm: Secondary | ICD-10-CM

## 2022-04-03 DIAGNOSIS — M6281 Muscle weakness (generalized): Secondary | ICD-10-CM

## 2022-04-03 DIAGNOSIS — R2689 Other abnormalities of gait and mobility: Secondary | ICD-10-CM

## 2022-04-03 DIAGNOSIS — M5416 Radiculopathy, lumbar region: Secondary | ICD-10-CM

## 2022-04-03 NOTE — Therapy (Signed)
OUTPATIENT PHYSICAL THERAPY TREATMENT NOTE   Patient Name: Alexander [name] MRN: [id number] DOB:[birth year], 51 y.o., male Today's Date: 04/03/2022   PT End of Session - 04/03/22 0810     Visit Number 7    Date for PT Re-Evaluation 04/15/22    Authorization Type UHC    PT Start Time 0809    PT Stop Time 7902    PT Time Calculation (min) 41 min    Activity Tolerance Patient tolerated treatment well    Behavior During Therapy  [facility] for tasks assessed/performed                 Past Medical History:  Diagnosis Date   CHEST PAIN 08/28/2009   DIABETES MELLITUS, TYPE II 03/20/2007   HYPERCHOLESTEROLEMIA 07/21/2008   HYPERTENSION 03/20/2007   Hypertensive urgency 03/27/2015   Left ventricular dysfunction 03/28/2015   EF 40-45%, no WMA, grade 2 diastolic dysfunction   Renal disorder    Past Surgical History:  Procedure Laterality Date   KNEE ARTHROSCOPY Right    Patient Active Problem List   Diagnosis Date Noted   Aortic atherosclerosis ([place]) 02/25/2022   Acute left-sided low back pain with left-sided sciatica 02/24/2022   GERD (gastroesophageal reflux disease) 10/26/2021   Colon cancer screening 10/26/2021   Bilateral hearing loss 05/10/2021   Eustachian tube dysfunction, left 05/10/2021   Persistent proteinuria 11/16/2019   Hyperkalemia 08/27/2018   Hyperphosphatemia 04/09/2018   Orthostasis 12/16/2017   Chest pain 04/04/2017   Normocytic anemia 04/04/2017   AKI (acute kidney injury) ([place]) 11/14/2016   Routine general medical examination at a health care facility 10/11/2016   Labile hypertension 07/12/2016   Dizziness 11/28/2015   Vitamin D deficiency 11/09/2015   Anemia due to stage 4 chronic kidney disease ([place]) 11/09/2015   Benign hypertension with CKD (chronic kidney disease) stage IV ([place]) 09/29/2015   Diabetic nephropathy ([place]) 03/29/2015   Diabetes mellitus with stage 4 chronic kidney disease ([place]) 03/29/2015   Acute renal failure ([place])    Dyslipidemia     Hypertensive urgency    Hypertensive heart disease with heart failure ([place]) 01/20/2015   Hypogonadism male 05/27/2012   Essential hypertension 03/20/2007    PCP: Hoyt Koch, MD   REFERRING PROVIDER: Girtha Rm, NP-C  REFERRING DIAG: [phone] (ICD-10-CM) - Acute left-sided low back pain with left-sided sciatica  Rationale for Evaluation and Treatment Rehabilitation  THERAPY DIAG:  Radiculopathy, lumbar region  Muscle weakness (generalized)  Other abnormalities of gait and mobility  Cramp and spasm  ONSET DATE: 2-3 weeks ago  SUBJECTIVE:  SUBJECTIVE STATEMENT: Reports pain is getting better in his back, woke up this morning and able to get back to sleep, not so bad in the morning.  Crossing leg to put on shoes/socks seems to be a trigger.  Pain becoming more localized to low back, L hip.  PAIN:  Are you having pain? Yes: NPRS scale: 4/10 Pain location: left lower back, more in hip, achy is thigh Pain description: sharpness, shooting, constant cramp Aggravating factors: sitting, driving Relieving factors: lying seat all the way back  PERTINENT HISTORY:  Rt knee scope, HTN, DM, heart failure 2016, kidney disease  PRECAUTIONS: None  WEIGHT BEARING RESTRICTIONS No  FALLS:  Has patient fallen in last 6 months? No  LIVING ENVIRONMENT: Lives with: lives with their family Lives in: House/apartment Stairs: Yes: Internal: 15 steps; can reach both Has following equipment at home: None  OCCUPATION: English as a second language teacher, sits mostly, also musician in church  PLOF: [place] it.   OBJECTIVE:   DIAGNOSTIC FINDINGS:  XR 02/25/22 Normal anatomic alignment. No evidence for acute fracture or dislocation. Preservation of the vertebral body heights. L4-5 degenerative  disc disease. L5-S1 degenerative disc disease. L4-5 and L5-S1 facet degenerative changes. Extensive aortic atherosclerosis  PATIENT SURVEYS:  Modified Oswestry 48% disability   SCREENING FOR RED FLAGS: Bowel or bladder incontinence: No Spinal tumors: No Cauda equina syndrome: No Compression fracture: No Abdominal aneurysm: No  COGNITION:  Overall cognitive status: Within functional limits for tasks assessed     SENSATION: WFL  MUSCLE LENGTH: Marked bil HS tightness (left limited by neural tension)  POSTURE: rounded shoulders, decreased lumbar lordosis, flexed trunk , and weight shift right  PALPATION: TTP at let gluteals, lumbar, lateral thigh, gastroc Spinal mobility: decreased UPA L4/5, L5/S1 left   LUMBAR ROM:   Active  AROM  eval 03/25/2022  Flexion 25%* 75%  Extension 50% 75%  Right lateral flexion WNL   Left lateral flexion WNL   Right rotation full   Left rotation 90%    (Blank rows = not tested)  LOWER EXTREMITY ROM:     Right LE WFL Left: limited by pain in hip flexion, knee ext/flex, ankle DF, else [facility]  LOWER EXTREMITY MMT:    MMT Right eval Left eval  Hip flexion 4+ 4+  Hip extension 5 4  Hip abduction 5 4*  Hip adduction    Hip internal rotation 4+ 4-*  Hip external rotation 4+ 4-*  Knee flexion 5 4-*  Knee extension 5 4*  Ankle dorsiflexion 5 4-  Ankle plantarflexion    Ankle inversion 5 4-  Ankle eversion 5 4-   (Blank rows = not tested)  LUMBAR SPECIAL TESTS:  Straight leg raise test: Positive and Slump test: Positive  FUNCTIONAL TESTS:  Prone on elbows decreases pain; PPU 2x10 centralizes pain to left low back/hip   GAIT: Distance walked: 40 Assistive device utilized: None Level of assistance: Complete Independence Comments: antalgic with increased knee flexion left, trunk lean left    TODAY'S TREATMENT  04/03/2022 Therapeutic Exercise: to improve strength and mobility.  Demo, verbal and tactile cues throughout for  technique. Bike L2 x 5 min  Standing extension over bar x 10 Bridges x 10  Prone leg extension x 10 bil  Prone on elbows with head extension x 10 Prone knee bends x 10 bil  Gait x 200' - cues for core engagement, heel strike.  Manual Therapy: to decrease muscle spasm and pain and improve mobility  L UPA mobs  grade 3-4 lumbar spine, press-ups x 10 with L UPA mob/overpressure - increased tingling down thigh.  STM/TPR to L piriformis glut.    04/01/2022 Therapeutic Exercise: to improve strength and mobility.  Demo, verbal and tactile cues throughout for technique. Bike L2 x 5 min  Standing side glides - prefers L to wall but increased pain both directions.  Standing extension - increased pain today  Seated flexion with ball Seated sciatic nerve glides LLE x 10  Supine sciatic nerve glides with leg propped on pillow - tolerated better, added to HEP x 10  Pelvic tilts (ant to post) x 10  Bridges (small) x 10  Manual Therapy: to decrease muscle spasm and pain and improve mobility UPA mobs to left lumbar paraspinals grade 3-4 Skilled palpation and monitoring of soft tissues during DN STM to left gluteals, piriformis  Trigger Point Dry-Needling  Treatment instructions: Expect mild to moderate muscle soreness.  Patient verbalized understanding of these instructions and education.  Patient Consent Given: Yes Education handout provided: Yes Muscles treated: L L4/5 multifidi, L piriformis Electrical stimulation performed: No Parameters: N/A Treatment response/outcome: Twitch Response Elicited and Palpable Increase in Muscle Length   03/25/22  Therex: Elliptical L2 x 5 min Lumbar ext at wall 5 sec hold x 10 Cat/cow 5 x 5 sec hold Supine HS stretch passive left x 30 sec  Dynamic HS stretch (knee ext/flex in supine) x 10, then passive using strap x 30  sec left only  Manual Therapy: UPA mobs to left lumbar Skilled palpation and monitoring of soft tissues during DN STM to left  gluteals, piriformis and lateral gastroc  Trigger Point Dry-Needling  Treatment instructions: Expect mild to moderate muscle soreness. S/S of pneumothorax if dry needled over a lung field, and to seek immediate medical attention should they occur. Patient verbalized understanding of these instructions and education.  Patient Consent Given: Yes Education handout provided: Yes Muscles treated: glute min/med/max/piriformis/left lateral gastroc Electrical stimulation performed: No Parameters: N/A Treatment response/outcome: Twitch Response Elicited and Palpable Increase in Muscle Length   PATIENT EDUCATION:  Education details: HEP update 03/25/22, 9/25, 9/27 Person educated: Patient Education method: Consulting civil engineer, Demonstration, Verbal cues, and Handouts Education comprehension: verbalized understanding and returned demonstration   HOME EXERCISE PROGRAM: Access Code: [email or web address]  ASSESSMENT: CLINICAL IMPRESSION: Alexander [name] reports improving LBP and decreased radiculopathy, still demonstrates strong extension preference, although too much extension can still provoke symptoms as well.  Given sequence of prone exercises to perform today starting prone, prone knee bends, prone hip extension, over pillow if needed, then progressing to prone on elbows, head extension, and finally press-ups.  His gait is performing but noted excessive lateral lean over L, improved with cues for posture.   Catalino continues to demonstrate potential for improvement and would benefit from continued skilled therapy to address impairments.    OBJECTIVE IMPAIRMENTS Abnormal gait, decreased activity tolerance, decreased ROM, decreased strength, hypomobility, increased muscle spasms, impaired flexibility, improper body mechanics, and pain.   ACTIVITY LIMITATIONS bending, squatting, sleeping, transfers, and locomotion level  PARTICIPATION LIMITATIONS:  pain with most ADLS  PERSONAL FACTORS Time since onset of  injury/illness/exacerbation and 3+ comorbidities: HTN, DM, kidney disease  are also affecting patient's functional outcome.   REHAB POTENTIAL: Excellent  CLINICAL DECISION MAKING: Stable/uncomplicated  EVALUATION COMPLEXITY: Low   GOALS: Goals reviewed with patient? Yes  SHORT TERM GOALS: Target date: 03/18/2022 (Remove Blue Hyperlink)  Patient will be independent with initial HEP.  Baseline:  Goal status: MET  2.  Patient will report regular centralization of radicular symptoms with prone press ups.  Baseline:  Goal status: MET  3.  Pt educated regarding ADL modifications and proper body mechanics Baseline:  Goal status: MET   LONG TERM GOALS: Target date: 04/15/2022  (Remove Blue Hyperlink)  Patient will be independent with advanced/ongoing HEP to improve outcomes and carryover.  Baseline:  Goal status: IN PROGRESS  2.  Patient will report 75% improvement in low back pain to improve QOL.  Baseline:  Goal status: IN PROGRESS  3.  Patient to demonstrate ability to achieve and maintain good spinal alignment/posturing and body mechanics needed for daily activities. Baseline:  Goal status: IN PROGRESS  4.  Patient will demonstrate functional pain free lumbar ROM to perform ADLs.   Baseline:  Goal status: IN PROGRESS  5.  Patient will demonstrate improved functional strength as demonstrated by ability to squat for correct body mechanics and lifting. Baseline:  Goal status: IN PROGRESS  6.  Patient will report 10% or less disability on LEFS to demonstrate improved functional ability.  Baseline: 48% Goal status: IN PROGRESS   7.  Patient will reports improved tolerance of sitting and driving by 71% or more. Baseline:  Goal status: IN PROGRESS  8.  Patient able to safely amb community distances with a non-antalgic gait pattern Baseline:  Goal status: IN PROGRESS    PLAN: PT FREQUENCY: 2x/week  PT DURATION: 6 weeks  PLANNED INTERVENTIONS: Therapeutic  exercises, Therapeutic activity, Neuromuscular re-education, Patient/Family education, Self Care, Joint mobilization, Joint manipulation, Aquatic Therapy, Dry Needling, Electrical stimulation, Spinal manipulation, Spinal mobilization, Cryotherapy, Moist heat, Taping, Traction, Ionotophoresis 4mg /ml Dexamethasone, and Manual therapy.  PLAN FOR NEXT SESSION:    core strengthening with extension bias, Manual and modalities prn.   Rennie Natter, PT, DPT  04/03/2022, 9:04 AM

## 2022-04-08 ENCOUNTER — Encounter: Payer: Self-pay | Admitting: Physical Therapy

## 2022-04-08 ENCOUNTER — Ambulatory Visit: Payer: 59 | Attending: Family Medicine | Admitting: Physical Therapy

## 2022-04-08 DIAGNOSIS — R252 Cramp and spasm: Secondary | ICD-10-CM | POA: Insufficient documentation

## 2022-04-08 DIAGNOSIS — M5416 Radiculopathy, lumbar region: Secondary | ICD-10-CM | POA: Diagnosis present

## 2022-04-08 DIAGNOSIS — R2689 Other abnormalities of gait and mobility: Secondary | ICD-10-CM | POA: Insufficient documentation

## 2022-04-08 DIAGNOSIS — M6281 Muscle weakness (generalized): Secondary | ICD-10-CM | POA: Diagnosis present

## 2022-04-08 NOTE — Therapy (Signed)
OUTPATIENT PHYSICAL THERAPY TREATMENT NOTE   Patient Name: Alexander [name] MRN: [id number] DOB:Jan 29, 1971, 51 y.o., male Today's Date: 04/08/2022   PT End of Session - 04/08/22 0903     Visit Number 8    Date for PT Re-Evaluation 04/15/22    Authorization Type UHC    PT Start Time 0901    PT Stop Time 0940    PT Time Calculation (min) 39 min    Activity Tolerance Patient tolerated treatment well    Behavior During Therapy [facility] for tasks assessed/performed                 Past Medical History:  Diagnosis Date   CHEST PAIN 08/28/2009   DIABETES MELLITUS, TYPE II 03/20/2007   HYPERCHOLESTEROLEMIA 07/21/2008   HYPERTENSION 03/20/2007   Hypertensive urgency 03/27/2015   Left ventricular dysfunction 03/28/2015   EF 40-45%, no WMA, grade 2 diastolic dysfunction   Renal disorder    Past Surgical History:  Procedure Laterality Date   KNEE ARTHROSCOPY Right    Patient Active Problem List   Diagnosis Date Noted   Aortic atherosclerosis ([place]) 02/25/2022   Acute left-sided low back pain with left-sided sciatica 02/24/2022   GERD (gastroesophageal reflux disease) 10/26/2021   Colon cancer screening 10/26/2021   Bilateral hearing loss 05/10/2021   Eustachian tube dysfunction, left 05/10/2021   Persistent proteinuria 11/16/2019   Hyperkalemia 08/27/2018   Hyperphosphatemia 04/09/2018   Orthostasis 12/16/2017   Chest pain 04/04/2017   Normocytic anemia 04/04/2017   AKI (acute kidney injury) ([place]) 11/14/2016   Routine general medical examination at a health care facility 10/11/2016   Labile hypertension 07/12/2016   Dizziness 11/28/2015   Vitamin D deficiency 11/09/2015   Anemia due to stage 4 chronic kidney disease ([place]) 11/09/2015   Benign hypertension with CKD (chronic kidney disease) stage IV ([place]) 09/29/2015   Diabetic nephropathy ([place]) 03/29/2015   Diabetes mellitus with stage 4 chronic kidney disease ([place]) 03/29/2015   Acute renal failure ([place])    Dyslipidemia     Hypertensive urgency    Hypertensive heart disease with heart failure ([place]) 01/20/2015   Hypogonadism male 05/27/2012   Essential hypertension 03/20/2007    PCP: Hoyt Koch, MD   REFERRING PROVIDER: Girtha Rm, NP-C  REFERRING DIAG: [phone] (ICD-10-CM) - Acute left-sided low back pain with left-sided sciatica  Rationale for Evaluation and Treatment Rehabilitation  THERAPY DIAG:  Radiculopathy, lumbar region  Muscle weakness (generalized)  Other abnormalities of gait and mobility  Cramp and spasm  ONSET DATE: 2-3 weeks ago  SUBJECTIVE:  SUBJECTIVE STATEMENT: Woke up hurting this morning, thinks car seat not helping, did do his stretches which seemed to help.  7/10 this morning, now 4-5/10.     PAIN:  Are you having pain? Yes: NPRS scale: 4-5/10 Pain location: left lower back, more in hip, achy is thigh Pain description: sharpness, shooting, constant cramp Aggravating factors: sitting, driving Relieving factors: lying seat all the way back  PERTINENT HISTORY:  Rt knee scope, HTN, DM, heart failure 2016, kidney disease  PRECAUTIONS: None  WEIGHT BEARING RESTRICTIONS No  FALLS:  Has patient fallen in last 6 months? No  LIVING ENVIRONMENT: Lives with: lives with their family Lives in: House/apartment Stairs: Yes: Internal: 15 steps; can reach both Has following equipment at home: None  OCCUPATION: English as a second language teacher, sits mostly, also musician in church  PLOF: [place] it.   OBJECTIVE:   DIAGNOSTIC FINDINGS:  XR 02/25/22 Normal anatomic alignment. No evidence for acute fracture or dislocation. Preservation of the vertebral body heights. L4-5 degenerative disc disease. L5-S1 degenerative disc disease. L4-5 and L5-S1 facet degenerative changes.  Extensive aortic atherosclerosis  PATIENT SURVEYS:  Modified Oswestry 48% disability   SCREENING FOR RED FLAGS: Bowel or bladder incontinence: No Spinal tumors: No Cauda equina syndrome: No Compression fracture: No Abdominal aneurysm: No  COGNITION:  Overall cognitive status: Within functional limits for tasks assessed     SENSATION: WFL  MUSCLE LENGTH: Marked bil HS tightness (left limited by neural tension)  POSTURE: rounded shoulders, decreased lumbar lordosis, flexed trunk , and weight shift right  PALPATION: TTP at let gluteals, lumbar, lateral thigh, gastroc Spinal mobility: decreased UPA L4/5, L5/S1 left   LUMBAR ROM:   Active  AROM  eval 03/25/2022  Flexion 25%* 75%  Extension 50% 75%  Right lateral flexion WNL   Left lateral flexion WNL   Right rotation full   Left rotation 90%    (Blank rows = not tested)  LOWER EXTREMITY ROM:     Right LE WFL Left: limited by pain in hip flexion, knee ext/flex, ankle DF, else [facility]  LOWER EXTREMITY MMT:    MMT Right eval Left eval  Hip flexion 4+ 4+  Hip extension 5 4  Hip abduction 5 4*  Hip adduction    Hip internal rotation 4+ 4-*  Hip external rotation 4+ 4-*  Knee flexion 5 4-*  Knee extension 5 4*  Ankle dorsiflexion 5 4-  Ankle plantarflexion    Ankle inversion 5 4-  Ankle eversion 5 4-   (Blank rows = not tested)  LUMBAR SPECIAL TESTS:  Straight leg raise test: Positive and Slump test: Positive  FUNCTIONAL TESTS:  Prone on elbows decreases pain; PPU 2x10 centralizes pain to left low back/hip   GAIT: Distance walked: 40 Assistive device utilized: None Level of assistance: Complete Independence Comments: antalgic with increased knee flexion left, trunk lean left    TODAY'S TREATMENT  04/08/2022 Therapeutic Exercise: to improve strength and mobility.  Demo, verbal and tactile cues throughout for technique. Bike L 2 x 5 min  Seated piriformis stretches post manual therapy Clamshells 2 x 10   Bridges with RTB 2 x 10  Standing hip extension diagonal with RTB - x 10  Manual Therapy: to decrease muscle spasm and pain and improve mobility.  STM/TPR to L glut medius and piriformis, demo IASTM with lacrosse ball, skilled palpation and monitoring during dry needling. Trigger Point Dry-Needling  Treatment instructions: Expect mild to moderate muscle soreness. Patient verbalized understanding of these instructions  and education.  Patient Consent Given: Yes Education handout provided: Previously provided Muscles treated: L glut medius, L piriformis Treatment response/outcome: Twitch Response Elicited and Palpable Increase in Muscle Length     04/03/2022 Therapeutic Exercise: to improve strength and mobility.  Demo, verbal and tactile cues throughout for technique. Bike L2 x 5 min  Standing extension over bar x 10 Bridges x 10  Prone leg extension x 10 bil  Prone on elbows with head extension x 10 Prone knee bends x 10 bil  Gait x 200' - cues for core engagement, heel strike.  Manual Therapy: to decrease muscle spasm and pain and improve mobility  L UPA mobs grade 3-4 lumbar spine, press-ups x 10 with L UPA mob/overpressure - increased tingling down thigh.  STM/TPR to L piriformis glut.    04/01/2022 Therapeutic Exercise: to improve strength and mobility.  Demo, verbal and tactile cues throughout for technique. Bike L2 x 5 min  Standing side glides - prefers L to wall but increased pain both directions.  Standing extension - increased pain today  Seated flexion with ball Seated sciatic nerve glides LLE x 10  Supine sciatic nerve glides with leg propped on pillow - tolerated better, added to HEP x 10  Pelvic tilts (ant to post) x 10  Bridges (small) x 10  Manual Therapy: to decrease muscle spasm and pain and improve mobility UPA mobs to left lumbar paraspinals grade 3-4 Skilled palpation and monitoring of soft tissues during DN STM to left gluteals, piriformis  Trigger Point  Dry-Needling  Treatment instructions: Expect mild to moderate muscle soreness.  Patient verbalized understanding of these instructions and education.  Patient Consent Given: Yes Education handout provided: Yes Muscles treated: L L4/5 multifidi, L piriformis Electrical stimulation performed: No Parameters: N/A Treatment response/outcome: Twitch Response Elicited and Palpable Increase in Muscle Length     PATIENT EDUCATION:  Education details: HEP update 03/25/22, 9/25, 9/27, 10/2 Person educated: Patient Education method: Consulting civil engineer, Demonstration, Verbal cues, and Handouts Education comprehension: verbalized understanding and returned demonstration   HOME EXERCISE PROGRAM: Access Code: [email or web address]  ASSESSMENT: CLINICAL IMPRESSION: Alexander [name] arrived 15 min late to session.  Reports walking better, but very antalgic when arrived today, with trendelberg gait.  He demonstrates atrophy over L piriformis and glut medius.  Tolerated manual therapy well, followed by stretches then progressed exercises for glut med and piriformis strengthening, issued RTB.  Pankaj continues to demonstrate potential for improvement and would benefit from continued skilled therapy to address impairments.    OBJECTIVE IMPAIRMENTS Abnormal gait, decreased activity tolerance, decreased ROM, decreased strength, hypomobility, increased muscle spasms, impaired flexibility, improper body mechanics, and pain.   ACTIVITY LIMITATIONS bending, squatting, sleeping, transfers, and locomotion level  PARTICIPATION LIMITATIONS:  pain with most ADLS  PERSONAL FACTORS Time since onset of injury/illness/exacerbation and 3+ comorbidities: HTN, DM, kidney disease  are also affecting patient's functional outcome.   REHAB POTENTIAL: Excellent  CLINICAL DECISION MAKING: Stable/uncomplicated  EVALUATION COMPLEXITY: Low   GOALS: Goals reviewed with patient? Yes  SHORT TERM GOALS: Target date: 03/18/2022 (Remove Blue  Hyperlink)  Patient will be independent with initial HEP.  Baseline:  Goal status: MET  2.  Patient will report regular centralization of radicular symptoms with prone press ups.  Baseline:  Goal status: MET  3.  Pt educated regarding ADL modifications and proper body mechanics Baseline:  Goal status: MET   LONG TERM GOALS: Target date: 04/15/2022  (Remove Blue Hyperlink)  Patient will be independent with advanced/ongoing  HEP to improve outcomes and carryover.  Baseline:  Goal status: IN PROGRESS  2.  Patient will report 75% improvement in low back pain to improve QOL.  Baseline:  Goal status: IN PROGRESS  3.  Patient to demonstrate ability to achieve and maintain good spinal alignment/posturing and body mechanics needed for daily activities. Baseline:  Goal status: IN PROGRESS  4.  Patient will demonstrate functional pain free lumbar ROM to perform ADLs.   Baseline:  Goal status: IN PROGRESS  5.  Patient will demonstrate improved functional strength as demonstrated by ability to squat for correct body mechanics and lifting. Baseline:  Goal status: IN PROGRESS  6.  Patient will report 10% or less disability on LEFS to demonstrate improved functional ability.  Baseline: 48% Goal status: IN PROGRESS   7.  Patient will reports improved tolerance of sitting and driving by 97% or more. Baseline:  Goal status: IN PROGRESS  8.  Patient able to safely amb community distances with a non-antalgic gait pattern Baseline:  Goal status: IN PROGRESS    PLAN: PT FREQUENCY: 2x/week  PT DURATION: 6 weeks  PLANNED INTERVENTIONS: Therapeutic exercises, Therapeutic activity, Neuromuscular re-education, Patient/Family education, Self Care, Joint mobilization, Joint manipulation, Aquatic Therapy, Dry Needling, Electrical stimulation, Spinal manipulation, Spinal mobilization, Cryotherapy, Moist heat, Taping, Traction, Ionotophoresis 4mg /ml Dexamethasone, and Manual therapy.  PLAN FOR  NEXT SESSION:    core strengthening with extension bias, Manual and modalities prn.   Rennie Natter, PT, DPT  04/08/2022, 9:48 AM

## 2022-04-11 ENCOUNTER — Ambulatory Visit: Payer: 59

## 2022-04-11 DIAGNOSIS — M5416 Radiculopathy, lumbar region: Secondary | ICD-10-CM | POA: Diagnosis not present

## 2022-04-11 DIAGNOSIS — R252 Cramp and spasm: Secondary | ICD-10-CM

## 2022-04-11 DIAGNOSIS — M6281 Muscle weakness (generalized): Secondary | ICD-10-CM

## 2022-04-11 DIAGNOSIS — R2689 Other abnormalities of gait and mobility: Secondary | ICD-10-CM

## 2022-04-11 NOTE — Therapy (Signed)
OUTPATIENT PHYSICAL THERAPY TREATMENT NOTE   Patient Name: Alexander [name] MRN: [id number] DOB:[birth year], 51 y.o., male Today's Date: 04/11/2022   PT End of Session - 04/11/22 1157     Visit Number 9    Date for PT Re-Evaluation 04/15/22    Authorization Type UHC    PT Start Time 1103    PT Stop Time 1148    PT Time Calculation (min) 45 min    Activity Tolerance Patient tolerated treatment well    Behavior During Therapy [facility] for tasks assessed/performed                  Past Medical History:  Diagnosis Date   CHEST PAIN 08/28/2009   DIABETES MELLITUS, TYPE II 03/20/2007   HYPERCHOLESTEROLEMIA 07/21/2008   HYPERTENSION 03/20/2007   Hypertensive urgency 03/27/2015   Left ventricular dysfunction 03/28/2015   EF 40-45%, no WMA, grade 2 diastolic dysfunction   Renal disorder    Past Surgical History:  Procedure Laterality Date   KNEE ARTHROSCOPY Right    Patient Active Problem List   Diagnosis Date Noted   Aortic atherosclerosis ([place]) 02/25/2022   Acute left-sided low back pain with left-sided sciatica 02/24/2022   GERD (gastroesophageal reflux disease) 10/26/2021   Colon cancer screening 10/26/2021   Bilateral hearing loss 05/10/2021   Eustachian tube dysfunction, left 05/10/2021   Persistent proteinuria 11/16/2019   Hyperkalemia 08/27/2018   Hyperphosphatemia 04/09/2018   Orthostasis 12/16/2017   Chest pain 04/04/2017   Normocytic anemia 04/04/2017   AKI (acute kidney injury) ([place]) 11/14/2016   Routine general medical examination at a health care facility 10/11/2016   Labile hypertension 07/12/2016   Dizziness 11/28/2015   Vitamin D deficiency 11/09/2015   Anemia due to stage 4 chronic kidney disease ([place]) 11/09/2015   Benign hypertension with CKD (chronic kidney disease) stage IV ([place]) 09/29/2015   Diabetic nephropathy ([place]) 03/29/2015   Diabetes mellitus with stage 4 chronic kidney disease ([place]) 03/29/2015   Acute renal failure ([place])    Dyslipidemia     Hypertensive urgency    Hypertensive heart disease with heart failure ([place]) 01/20/2015   Hypogonadism male 05/27/2012   Essential hypertension 03/20/2007    PCP: Hoyt Koch, MD   REFERRING PROVIDER: Girtha Rm, NP-C  REFERRING DIAG: [phone] (ICD-10-CM) - Acute left-sided low back pain with left-sided sciatica  Rationale for Evaluation and Treatment Rehabilitation  THERAPY DIAG:  Radiculopathy, lumbar region  Muscle weakness (generalized)  Other abnormalities of gait and mobility  Cramp and spasm  ONSET DATE: 2-3 weeks ago  SUBJECTIVE:  SUBJECTIVE STATEMENT: Less pain today, L hip gets very tired at the end of the day.   PAIN:  Are you having pain? Yes: NPRS scale: 3/10 Pain location: left lower back, more in hip, achy is thigh Pain description: sharpness, shooting, constant cramp Aggravating factors: sitting, driving Relieving factors: lying seat all the way back  PERTINENT HISTORY:  Rt knee scope, HTN, DM, heart failure 2016, kidney disease  PRECAUTIONS: None  WEIGHT BEARING RESTRICTIONS No  FALLS:  Has patient fallen in last 6 months? No  LIVING ENVIRONMENT: Lives with: lives with their family Lives in: House/apartment Stairs: Yes: Internal: 15 steps; can reach both Has following equipment at home: None  OCCUPATION: English as a second language teacher, sits mostly, also musician in church  PLOF: [place] it.   OBJECTIVE:   DIAGNOSTIC FINDINGS:  XR 02/25/22 Normal anatomic alignment. No evidence for acute fracture or dislocation. Preservation of the vertebral body heights. L4-5 degenerative disc disease. L5-S1 degenerative disc disease. L4-5 and L5-S1 facet degenerative changes. Extensive aortic atherosclerosis  PATIENT SURVEYS:  Modified Oswestry 48%  disability   SCREENING FOR RED FLAGS: Bowel or bladder incontinence: No Spinal tumors: No Cauda equina syndrome: No Compression fracture: No Abdominal aneurysm: No  COGNITION:  Overall cognitive status: Within functional limits for tasks assessed     SENSATION: WFL  MUSCLE LENGTH: Marked bil HS tightness (left limited by neural tension)  POSTURE: rounded shoulders, decreased lumbar lordosis, flexed trunk , and weight shift right  PALPATION: TTP at let gluteals, lumbar, lateral thigh, gastroc Spinal mobility: decreased UPA L4/5, L5/S1 left   LUMBAR ROM:   Active  AROM  eval 03/25/2022  Flexion 25%* 75%  Extension 50% 75%  Right lateral flexion WNL   Left lateral flexion WNL   Right rotation full   Left rotation 90%    (Blank rows = not tested)  LOWER EXTREMITY ROM:     Right LE WFL Left: limited by pain in hip flexion, knee ext/flex, ankle DF, else [facility]  LOWER EXTREMITY MMT:    MMT Right eval Left eval  Hip flexion 4+ 4+  Hip extension 5 4  Hip abduction 5 4*  Hip adduction    Hip internal rotation 4+ 4-*  Hip external rotation 4+ 4-*  Knee flexion 5 4-*  Knee extension 5 4*  Ankle dorsiflexion 5 4-  Ankle plantarflexion    Ankle inversion 5 4-  Ankle eversion 5 4-   (Blank rows = not tested)  LUMBAR SPECIAL TESTS:  Straight leg raise test: Positive and Slump test: Positive  FUNCTIONAL TESTS:  Prone on elbows decreases pain; PPU 2x10 centralizes pain to left low back/hip   GAIT: Distance walked: 40 Assistive device utilized: None Level of assistance: Complete Independence Comments: antalgic with increased knee flexion left, trunk lean left    TODAY'S TREATMENT  04/11/22 Treadmill x 6 min 1 mph Supine bridge with ball squeeze 2x10 S/L clams x 15 red TB bil S/L hip abduction 2x10 bil S/L hip extension 2x10 bil RDL x 10  Manual Therapy: STM to bil glutes, piriformis   04/08/2022 Therapeutic Exercise: to improve strength and mobility.   Demo, verbal and tactile cues throughout for technique. Bike L 2 x 5 min  Seated piriformis stretches post manual therapy Clamshells 2 x 10  Bridges with RTB 2 x 10  Standing hip extension diagonal with RTB - x 10  Manual Therapy: to decrease muscle spasm and pain and improve mobility.  STM/TPR to L glut medius and  piriformis, demo IASTM with lacrosse ball, skilled palpation and monitoring during dry needling. Trigger Point Dry-Needling  Treatment instructions: Expect mild to moderate muscle soreness. Patient verbalized understanding of these instructions and education.  Patient Consent Given: Yes Education handout provided: Previously provided Muscles treated: L glut medius, L piriformis Treatment response/outcome: Twitch Response Elicited and Palpable Increase in Muscle Length     04/03/2022 Therapeutic Exercise: to improve strength and mobility.  Demo, verbal and tactile cues throughout for technique. Bike L2 x 5 min  Standing extension over bar x 10 Bridges x 10  Prone leg extension x 10 bil  Prone on elbows with head extension x 10 Prone knee bends x 10 bil  Gait x 200' - cues for core engagement, heel strike.  Manual Therapy: to decrease muscle spasm and pain and improve mobility  L UPA mobs grade 3-4 lumbar spine, press-ups x 10 with L UPA mob/overpressure - increased tingling down thigh.  STM/TPR to L piriformis glut.         PATIENT EDUCATION:  Education details: HEP update 03/25/22, 9/25, 9/27, 10/2 Person educated: Patient Education method: Explanation, Demonstration, Verbal cues, and Handouts Education comprehension: verbalized understanding and returned demonstration   HOME EXERCISE PROGRAM: Access Code: [email or web address]  ASSESSMENT: CLINICAL IMPRESSION: Pt arrived today with reports of decreased pain in L gute area. Overall her reports 70% overall improvement with sitting tolerance. HE was able to demonstrate a squat with good body mechanics today as well. We  progressed gluteal strengthening to improve stability of both hips. Pt had no issues only fatigue in both glutes after exercises. Finsihed session with STM to both glutes to reduce pain and increase flexibility in muscles. Pt stated that he beleives he is able to transition to HEP after next visit.   OBJECTIVE IMPAIRMENTS Abnormal gait, decreased activity tolerance, decreased ROM, decreased strength, hypomobility, increased muscle spasms, impaired flexibility, improper body mechanics, and pain.   ACTIVITY LIMITATIONS bending, squatting, sleeping, transfers, and locomotion level  PARTICIPATION LIMITATIONS:  pain with most ADLS  PERSONAL FACTORS Time since onset of injury/illness/exacerbation and 3+ comorbidities: HTN, DM, kidney disease  are also affecting patient's functional outcome.   REHAB POTENTIAL: Excellent  CLINICAL DECISION MAKING: Stable/uncomplicated  EVALUATION COMPLEXITY: Low   GOALS: Goals reviewed with patient? Yes  SHORT TERM GOALS: Target date: 03/18/2022 (Remove Blue Hyperlink)  Patient will be independent with initial HEP.  Baseline:  Goal status: MET  2.  Patient will report regular centralization of radicular symptoms with prone press ups.  Baseline:  Goal status: MET  3.  Pt educated regarding ADL modifications and proper body mechanics Baseline:  Goal status: MET   LONG TERM GOALS: Target date: 04/15/2022  (Remove Blue Hyperlink)  Patient will be independent with advanced/ongoing HEP to improve outcomes and carryover.  Baseline:  Goal status: IN PROGRESS  2.  Patient will report 75% improvement in low back pain to improve QOL.  Baseline:  Goal status: IN PROGRESS  3.  Patient to demonstrate ability to achieve and maintain good spinal alignment/posturing and body mechanics needed for daily activities. Baseline:  Goal status: IN PROGRESS  4.  Patient will demonstrate functional pain free lumbar ROM to perform ADLs.   Baseline:  Goal status: IN  PROGRESS  5.  Patient will demonstrate improved functional strength as demonstrated by ability to squat for correct body mechanics and lifting. Baseline:  Goal status: IN PROGRESS - 04/11/22 able to squat with good mechanics  6.  Patient will report 10% or  less disability on LEFS to demonstrate improved functional ability.  Baseline: 48% Goal status: IN PROGRESS   7.  Patient will reports improved tolerance of sitting and driving by 84% or more. Baseline:  Goal status: IN PROGRESS- 04/11/22 70%  8.  Patient able to safely amb community distances with a non-antalgic gait pattern Baseline:  Goal status: IN PROGRESS    PLAN: PT FREQUENCY: 2x/week  PT DURATION: 6 weeks  PLANNED INTERVENTIONS: Therapeutic exercises, Therapeutic activity, Neuromuscular re-education, Patient/Family education, Self Care, Joint mobilization, Joint manipulation, Aquatic Therapy, Dry Needling, Electrical stimulation, Spinal manipulation, Spinal mobilization, Cryotherapy, Moist heat, Taping, Traction, Ionotophoresis 4mg /ml Dexamethasone, and Manual therapy.  PLAN FOR NEXT SESSION:    core strengthening with extension bias, Manual and modalities prn.   Artist Pais, PTA 04/11/2022, 11:58 AM

## 2022-04-14 NOTE — Therapy (Signed)
OUTPATIENT PHYSICAL THERAPY TREATMENT NOTE   Patient Name: Alexander [name] MRN: [id number] DOB:19-Jun-1971, 51 y.o., male Today's Date: 04/15/2022   PT End of Session - 04/15/22 0853     Visit Number 10    Number of Visits 18    Date for PT Re-Evaluation 05/13/22    Authorization Type [employer]    PT Start Time 0853    PT Stop Time 0933    PT Time Calculation (min) 40 min    Activity Tolerance Patient tolerated treatment well    Behavior During Therapy [facility] for tasks assessed/performed                   Past Medical History:  Diagnosis Date   CHEST PAIN 08/28/2009   DIABETES MELLITUS, TYPE II 03/20/2007   HYPERCHOLESTEROLEMIA 07/21/2008   HYPERTENSION 03/20/2007   Hypertensive urgency 03/27/2015   Left ventricular dysfunction 03/28/2015   EF 40-45%, no WMA, grade 2 diastolic dysfunction   Renal disorder    Past Surgical History:  Procedure Laterality Date   KNEE ARTHROSCOPY Right    Patient Active Problem List   Diagnosis Date Noted   Aortic atherosclerosis ([place]) 02/25/2022   Acute left-sided [name] back pain with left-sided sciatica 02/24/2022   GERD (gastroesophageal reflux disease) 10/26/2021   Colon cancer screening 10/26/2021   Bilateral hearing loss 05/10/2021   Eustachian tube dysfunction, left 05/10/2021   Persistent proteinuria 11/16/2019   Hyperkalemia 08/27/2018   Hyperphosphatemia 04/09/2018   Orthostasis 12/16/2017   Chest pain 04/04/2017   Normocytic anemia 04/04/2017   AKI (acute kidney injury) ([place]) 11/14/2016   Routine general medical examination at a health care facility 10/11/2016   Labile hypertension 07/12/2016   Dizziness 11/28/2015   Vitamin D deficiency 11/09/2015   Anemia due to stage 4 chronic kidney disease ([place]) 11/09/2015   Benign hypertension with CKD (chronic kidney disease) stage IV ([place]) 09/29/2015   Diabetic nephropathy ([place]) 03/29/2015   Diabetes mellitus with stage 4 chronic kidney disease ([place]) 03/29/2015   Acute renal  failure ([place])    Dyslipidemia    Hypertensive urgency    Hypertensive heart disease with heart failure ([place]) 01/20/2015   Hypogonadism male 05/27/2012   Essential hypertension 03/20/2007    PCP: Hoyt Koch, MD   REFERRING PROVIDER: Girtha Rm, NP-C  REFERRING DIAG: [id number] (ICD-10-CM) - Acute left-sided [name] back pain with left-sided sciatica  Rationale for Evaluation and Treatment Rehabilitation  THERAPY DIAG:  Radiculopathy, lumbar region  Muscle weakness (generalized)  Other abnormalities of gait and mobility  Cramp and spasm  ONSET DATE: 2-3 weeks ago  SUBJECTIVE:  SUBJECTIVE STATEMENT: Yesterday his hip started cramping more. Feels more when he is standing. Amb with limpt today.  Took him a while to get to sleep last night.    PAIN:  Are you having pain? Yes: NPRS scale: 5-6/10 Pain location: left hip, achy is thigh Pain description: sharpness, shooting, constant cramp Aggravating factors: sitting, driving Relieving factors: lying seat all the way back  PERTINENT HISTORY:  Rt knee scope, HTN, DM, heart failure 2016, kidney disease  PRECAUTIONS: None  WEIGHT BEARING RESTRICTIONS No  FALLS:  Has patient fallen in last 6 months? No  LIVING ENVIRONMENT: Lives with: lives with their family Lives in: House/apartment Stairs: Yes: Internal: 15 steps; can reach both Has following equipment at home: None  OCCUPATION: English as a second language teacher, sits mostly, also musician in church  PLOF: [employer].   OBJECTIVE:   DIAGNOSTIC FINDINGS:  XR 02/25/22 Normal anatomic alignment. No evidence for acute fracture or dislocation. Preservation of the vertebral body heights. L4-5 degenerative disc disease. L5-S1 degenerative disc disease. L4-5 and L5-S1 facet  degenerative changes. Extensive aortic atherosclerosis  PATIENT SURVEYS:  Modified Oswestry 48% disability  LEFS 45/80 = 56.3% disabiltiy  04/15/22  SCREENING FOR RED FLAGS: Bowel or bladder incontinence: No Spinal tumors: No Cauda equina syndrome: No Compression fracture: No Abdominal aneurysm: No  COGNITION:  Overall cognitive status: Within functional limits for tasks assessed     SENSATION: WFL  MUSCLE LENGTH: Marked bil HS tightness (left limited by neural tension)  POSTURE: rounded shoulders, decreased lumbar lordosis, flexed trunk , and weight shift right  PALPATION: TTP at let gluteals, lumbar, lateral thigh, gastroc Spinal mobility: decreased UPA L4/5, L5/S1 left   LUMBAR ROM:   Active  AROM  eval 03/25/2022 04/15/22  Flexion 25%* 75% 75%  Extension 50% 75% WFL  Right lateral flexion WNL  WNL  Left lateral flexion WNL  WNL  Right rotation full  Full  Left rotation 90%  Full   (Blank rows = not tested)  LOWER EXTREMITY ROM:     Right LE WFL Left: limited by pain in hip flexion, knee ext/flex, ankle DF, else [facility]  LOWER EXTREMITY MMT:    MMT Right eval Left eval Right 04/15/22 Left 04/15/22  Hip flexion 4+ 4+ 4+ 4+  Hip extension _0 4+  Hip abduction 5 4*  4+  Hip adduction    5  Hip internal rotation 4+ 4-*  5  Hip external rotation 4+ 4-*  4+  Knee flexion 5 4-* 4+ 4+  Knee extension 5 4*  5  Ankle dorsiflexion 5 4-  4-  Ankle plantarflexion      Ankle inversion 5 4-    Ankle eversion 5 4-     (Blank rows = not tested)*pain  LUMBAR SPECIAL TESTS:  Straight leg raise test: Positive and Slump test: Positive  FUNCTIONAL TESTS:  Prone on elbows decreases pain; PPU 2x10 centralizes pain to left [name] back/hip   GAIT: Distance walked: 40 Assistive device utilized: None Level of assistance: Complete Independence Comments: left trendelenberg    TODAY'S TREATMENT  04/15/22  Prone x 1 min decreases pain some.  PPU x 10  Manual:  Hooklying  manual lumbar traction 3 x 20-30 sec; pt gets some relief with pull Skilled palpation and monitoring of soft tissues during DN STM to left gluteals and piriformis TPR to left QL Trigger Point Dry-Needling  Treatment instructions: Expect mild to moderate muscle soreness. S/S of pneumothorax if dry needled over  a lung field, and to seek immediate medical attention should they occur. Patient verbalized understanding of these instructions and education.  Patient Consent Given: Yes Education handout provided: Previously provided Muscles treated: Left side Glute medius, piriformis, QL Electrical stimulation performed: No Parameters: N/A Treatment response/outcome: Twitch Response Elicited and Palpable Increase in Muscle Length    MMT, ROM, gait and goals assessed for recert.   04/11/22 Treadmill x 6 min 1 mph Supine bridge with ball squeeze 2x10 S/L clams x 15 red TB bil S/L hip abduction 2x10 bil S/L hip extension 2x10 bil RDL x 10  Manual Therapy: STM to bil glutes, piriformis   04/08/2022 Therapeutic Exercise: to improve strength and mobility.  Demo, verbal and tactile cues throughout for technique. Bike L 2 x 5 min  Seated piriformis stretches post manual therapy Clamshells 2 x 10  Bridges with RTB 2 x 10  Standing hip extension diagonal with RTB - x 10  Manual Therapy: to decrease muscle spasm and pain and improve mobility.  STM/TPR to L glut medius and piriformis, demo IASTM with lacrosse ball, skilled palpation and monitoring during dry needling. Trigger Point Dry-Needling  Treatment instructions: Expect mild to moderate muscle soreness. Patient verbalized understanding of these instructions and education.  Patient Consent Given: Yes Education handout provided: Previously provided Muscles treated: L glut medius, L piriformis Treatment response/outcome: Twitch Response Elicited and Palpable Increase in Muscle Length     04/03/2022 Therapeutic Exercise: to improve strength  and mobility.  Demo, verbal and tactile cues throughout for technique. Bike L2 x 5 min  Standing extension over bar x 10 Bridges x 10  Prone leg extension x 10 bil  Prone on elbows with head extension x 10 Prone knee bends x 10 bil  Gait x 200' - cues for core engagement, heel strike.  Manual Therapy: to decrease muscle spasm and pain and improve mobility  L UPA mobs grade 3-4 lumbar spine, press-ups x 10 with L UPA mob/overpressure - increased tingling down thigh.  STM/TPR to L piriformis glut.         PATIENT EDUCATION:  Education details: HEP update 03/25/22, 9/25, 9/27, 10/2 Person educated: Patient Education method: Consulting civil engineer, Demonstration, Verbal cues, and Handouts Education comprehension: verbalized understanding and returned demonstration   HOME EXERCISE PROGRAM: Access Code: [email or web address]  ASSESSMENT: CLINICAL IMPRESSION: Alexander [name] reports about 70% improvement overall with his lumbar radiculopathy, however he continues to experience pain and cramping into left hip and thigh, gait abnormalities and weakness in his left LE. Functionally he reports 56% disability on the LEFS and is limited with sitting, standing and walking. He demonstrates functional glute med weakness with gait (left trendelenberg) however his glute med strength and other LE strength has improved with MMT since eval. Alexander [name] gets relief with prone lying and extension bed exercises and had some relief with manual lumbar traction today. He would benefit from a trial of mechanical lumbar traction next visit. He gets relief from MT/DN to the left hip, but he continues to have positive neural tension in the sciatic nerve limiting  left knee extension in sitting and supine. Alexander [name] continues to demonstrate potential for improvement and would benefit from continued skilled therapy to address impairments.  I recommend ongoing PT 2x/wk for 4 weeks to attain unmet LTGs.    OBJECTIVE IMPAIRMENTS Abnormal gait,  decreased activity tolerance, decreased ROM, decreased strength, hypomobility, increased muscle spasms, impaired flexibility, improper body mechanics, and pain.   ACTIVITY LIMITATIONS bending, squatting, sleeping, transfers, and locomotion  level  PARTICIPATION LIMITATIONS:  pain with most ADLS  PERSONAL FACTORS Time since onset of injury/illness/exacerbation and 3+ comorbidities: HTN, DM, kidney disease  are also affecting patient's functional outcome.   REHAB POTENTIAL: Excellent  CLINICAL DECISION MAKING: Stable/uncomplicated  EVALUATION COMPLEXITY: [name]   GOALS: Goals reviewed with patient? Yes  SHORT TERM GOALS: Target date: 03/18/2022 (Remove Blue Hyperlink)  Patient will be independent with initial HEP.  Baseline:  Goal status: MET  2.  Patient will report regular centralization of radicular symptoms with prone press ups.  Baseline:  Goal status: MET  3.  Pt educated regarding ADL modifications and proper body mechanics Baseline:  Goal status: MET   LONG TERM GOALS: Target date: 04/15/2022  (Remove Blue Hyperlink)  Patient will be independent with advanced/ongoing HEP to improve outcomes and carryover.  Baseline:  Goal status: IN PROGRESS  2.  Patient will report 75% improvement in [name] back pain to improve QOL.  Baseline:  Goal status: IN PROGRESS about 70% improvement  3.  Patient to demonstrate ability to achieve and maintain good spinal alignment/posturing and body mechanics needed for daily activities. Baseline:  Goal status: IN PROGRESS 04/15/22  4.  Patient will demonstrate functional pain free lumbar ROM to perform ADLs.   Baseline:  Goal status: IN PROGRESS MET except end range flexion and ext still has pain  5.  Patient will demonstrate improved functional strength as demonstrated by ability to squat for correct body mechanics and lifting. Baseline:  Goal status: IN PROGRESS - 04/11/22 able to squat with good mechanics  6.  Patient will report <= 36%  to demonstrate improved functional ability.  Baseline: 48% eval Goal status: REVISED 04/15/22   7.  Patient will reports improved tolerance of sitting and driving by 88% or more. Baseline:  Goal status: IN PROGRESS- 04/11/22 70%  8.  Patient able to safely amb community distances with a non-antalgic gait pattern Baseline:  Goal status: IN PROGRESS 04/15/22  9.  Patient will report <= 47% disability on LEFS to demonstrate improved functional ability.  Baseline: 56% on 04/15/22 Goal status: NEW 04/15/22    PLAN: PT FREQUENCY: 2x/week  PT DURATION: 6 weeks  PLANNED INTERVENTIONS: Therapeutic exercises, Therapeutic activity, Neuromuscular re-education, Patient/Family education, Self Care, Joint mobilization, Joint manipulation, Aquatic Therapy, Dry Needling, Electrical stimulation, Spinal manipulation, Spinal mobilization, Cryotherapy, Moist heat, Taping, Traction, Ionotophoresis 57m/ml Dexamethasone, and Manual therapy.  PLAN FOR NEXT SESSION:   Complete modified Oswestry, trial of intermittent mechanical traction, continue focusing on lumbar stab, glute med strength, and sciatic nerve mobility.   Zaleigh Bermingham, PT 04/15/2022, 10:03 AM

## 2022-04-15 ENCOUNTER — Ambulatory Visit: Payer: 59 | Admitting: Physical Therapy

## 2022-04-15 ENCOUNTER — Encounter: Payer: Self-pay | Admitting: Physical Therapy

## 2022-04-15 DIAGNOSIS — M5416 Radiculopathy, lumbar region: Secondary | ICD-10-CM

## 2022-04-15 DIAGNOSIS — R2689 Other abnormalities of gait and mobility: Secondary | ICD-10-CM

## 2022-04-15 DIAGNOSIS — M6281 Muscle weakness (generalized): Secondary | ICD-10-CM

## 2022-04-15 DIAGNOSIS — R252 Cramp and spasm: Secondary | ICD-10-CM

## 2022-04-17 ENCOUNTER — Ambulatory Visit: Payer: 59 | Admitting: Physical Therapy

## 2022-04-17 ENCOUNTER — Encounter: Payer: Self-pay | Admitting: Physical Therapy

## 2022-04-17 DIAGNOSIS — R252 Cramp and spasm: Secondary | ICD-10-CM

## 2022-04-17 DIAGNOSIS — M5416 Radiculopathy, lumbar region: Secondary | ICD-10-CM | POA: Diagnosis not present

## 2022-04-17 DIAGNOSIS — M6281 Muscle weakness (generalized): Secondary | ICD-10-CM

## 2022-04-17 DIAGNOSIS — R2689 Other abnormalities of gait and mobility: Secondary | ICD-10-CM

## 2022-04-17 NOTE — Therapy (Signed)
OUTPATIENT PHYSICAL THERAPY TREATMENT NOTE   Patient Name: Alexander [name] MRN: [id number] DOB:10-19-1970, 51 y.o., male Today's Date: 04/17/2022   PT End of Session - 04/17/22 0937     Visit Number 11    Number of Visits 18    Date for PT Re-Evaluation 05/13/22    Authorization Type UHC    PT Start Time 0935    PT Stop Time 1013    PT Time Calculation (min) 38 min    Activity Tolerance Patient tolerated treatment well    Behavior During Therapy [facility] for tasks assessed/performed                   Past Medical History:  Diagnosis Date   CHEST PAIN 08/28/2009   DIABETES MELLITUS, TYPE II 03/20/2007   HYPERCHOLESTEROLEMIA 07/21/2008   HYPERTENSION 03/20/2007   Hypertensive urgency 03/27/2015   Left ventricular dysfunction 03/28/2015   EF 40-45%, no WMA, grade 2 diastolic dysfunction   Renal disorder    Past Surgical History:  Procedure Laterality Date   KNEE ARTHROSCOPY Right    Patient Active Problem List   Diagnosis Date Noted   Aortic atherosclerosis ([place]) 02/25/2022   Acute left-sided low back pain with left-sided sciatica 02/24/2022   GERD (gastroesophageal reflux disease) 10/26/2021   Colon cancer screening 10/26/2021   Bilateral hearing loss 05/10/2021   Eustachian tube dysfunction, left 05/10/2021   Persistent proteinuria 11/16/2019   Hyperkalemia 08/27/2018   Hyperphosphatemia 04/09/2018   Orthostasis 12/16/2017   Chest pain 04/04/2017   Normocytic anemia 04/04/2017   AKI (acute kidney injury) ([place]) 11/14/2016   Routine general medical examination at a health care facility 10/11/2016   Labile hypertension 07/12/2016   Dizziness 11/28/2015   Vitamin D deficiency 11/09/2015   Anemia due to stage 4 chronic kidney disease ([place]) 11/09/2015   Benign hypertension with CKD (chronic kidney disease) stage IV ([place]) 09/29/2015   Diabetic nephropathy ([place]) 03/29/2015   Diabetes mellitus with stage 4 chronic kidney disease ([place]) 03/29/2015   Acute renal  failure ([place])    Dyslipidemia    Hypertensive urgency    Hypertensive heart disease with heart failure ([place]) 01/20/2015   Hypogonadism male 05/27/2012   Essential hypertension 03/20/2007    PCP: Hoyt Koch, MD   REFERRING PROVIDER: Girtha Rm, NP-C  REFERRING DIAG: [phone] (ICD-10-CM) - Acute left-sided low back pain with left-sided sciatica  Rationale for Evaluation and Treatment Rehabilitation  THERAPY DIAG:  Radiculopathy, lumbar region  Muscle weakness (generalized)  Other abnormalities of gait and mobility  Cramp and spasm  ONSET DATE: 2-3 weeks ago  SUBJECTIVE:  SUBJECTIVE STATEMENT: Doing well, just feels achey in morning, but thinks it is long car rides taking daughter to school is trigger the pain.    PAIN:  Are you having pain? Yes: NPRS scale: 4/10 Pain location: left hip, rcramping in leg Pain description: sharpness, shooting, constant cramp Aggravating factors: sitting, driving Relieving factors: lying seat all the way back  PERTINENT HISTORY:  Rt knee scope, HTN, DM, heart failure 2016, kidney disease  PRECAUTIONS: None  WEIGHT BEARING RESTRICTIONS No  FALLS:  Has patient fallen in last 6 months? No  LIVING ENVIRONMENT: Lives with: lives with their family Lives in: House/apartment Stairs: Yes: Internal: 15 steps; can reach both Has following equipment at home: None  OCCUPATION: English as a second language teacher, sits mostly, also musician in church  PLOF: [place] it.   OBJECTIVE:   DIAGNOSTIC FINDINGS:  XR 02/25/22 Normal anatomic alignment. No evidence for acute fracture or dislocation. Preservation of the vertebral body heights. L4-5 degenerative disc disease. L5-S1 degenerative disc disease. L4-5 and L5-S1 facet degenerative changes.  Extensive aortic atherosclerosis  PATIENT SURVEYS:  Modified Oswestry 48% disability  LEFS 45/80 = 56.3% disabiltiy  04/15/22  SCREENING FOR RED FLAGS: Bowel or bladder incontinence: No Spinal tumors: No Cauda equina syndrome: No Compression fracture: No Abdominal aneurysm: No  COGNITION:  Overall cognitive status: Within functional limits for tasks assessed     SENSATION: WFL  MUSCLE LENGTH: Marked bil HS tightness (left limited by neural tension)  POSTURE: rounded shoulders, decreased lumbar lordosis, flexed trunk , and weight shift right  PALPATION: TTP at let gluteals, lumbar, lateral thigh, gastroc Spinal mobility: decreased UPA L4/5, L5/S1 left   LUMBAR ROM:   Active  AROM  eval 03/25/2022 04/15/22  Flexion 25%* 75% 75%  Extension 50% 75% WFL  Right lateral flexion WNL  WNL  Left lateral flexion WNL  WNL  Right rotation full  Full  Left rotation 90%  Full   (Blank rows = not tested)  LOWER EXTREMITY ROM:     Right LE WFL Left: limited by pain in hip flexion, knee ext/flex, ankle DF, else [facility]  LOWER EXTREMITY MMT:    MMT Right eval Left eval Right 04/15/22 Left 04/15/22  Hip flexion 4+ 4+ 4+ 4+  Hip extension _0 4+  Hip abduction 5 4*  4+  Hip adduction    5  Hip internal rotation 4+ 4-*  5  Hip external rotation 4+ 4-*  4+  Knee flexion 5 4-* 4+ 4+  Knee extension 5 4*  5  Ankle dorsiflexion 5 4-  4-  Ankle plantarflexion      Ankle inversion 5 4-    Ankle eversion 5 4-     (Blank rows = not tested)*pain  LUMBAR SPECIAL TESTS:  Straight leg raise test: Positive and Slump test: Positive  FUNCTIONAL TESTS:  Prone on elbows decreases pain; PPU 2x10 centralizes pain to left low back/hip   GAIT: Distance walked: 40 Assistive device utilized: None Level of assistance: Complete Independence Comments: left trendelenberg    TODAY'S TREATMENT  04/17/2022 Therapeutic Exercise: to improve strength and mobility.  Demo, verbal and tactile cues  throughout for technique. Bike L 2 x 5 min  Prone press-ups 2 x 10 with hips offset to L Mechanical  Lumbar Traction x 20 min including set-up and steps up, 15 min treatment time at 90 lbs max 75 lbs min, 60 sec on/10 sec rest, 2 steps up, 2 steps down, with table at 20 deg  decline, to decrease radicular symptoms and pain.    04/15/22  Prone x 1 min decreases pain some.  PPU x 10  Manual:  Hooklying manual lumbar traction 3 x 20-30 sec; pt gets some relief with pull Skilled palpation and monitoring of soft tissues during DN STM to left gluteals and piriformis TPR to left QL Trigger Point Dry-Needling  Treatment instructions: Expect mild to moderate muscle soreness. S/S of pneumothorax if dry needled over a lung field, and to seek immediate medical attention should they occur. Patient verbalized understanding of these instructions and education.  Patient Consent Given: Yes Education handout provided: Previously provided Muscles treated: Left side Glute medius, piriformis, QL Electrical stimulation performed: No Parameters: N/A Treatment response/outcome: Twitch Response Elicited and Palpable Increase in Muscle Length    MMT, ROM, gait and goals assessed for recert.   04/11/22 Treadmill x 6 min 1 mph Supine bridge with ball squeeze 2x10 S/L clams x 15 red TB bil S/L hip abduction 2x10 bil S/L hip extension 2x10 bil RDL x 10  Manual Therapy: STM to bil glutes, piriformis   04/08/2022 Therapeutic Exercise: to improve strength and mobility.  Demo, verbal and tactile cues throughout for technique. Bike L 2 x 5 min  Seated piriformis stretches post manual therapy Clamshells 2 x 10  Bridges with RTB 2 x 10  Standing hip extension diagonal with RTB - x 10  Manual Therapy: to decrease muscle spasm and pain and improve mobility.  STM/TPR to L glut medius and piriformis, demo IASTM with lacrosse ball, skilled palpation and monitoring during dry needling. Trigger Point Dry-Needling   Treatment instructions: Expect mild to moderate muscle soreness. Patient verbalized understanding of these instructions and education.  Patient Consent Given: Yes Education handout provided: Previously provided Muscles treated: L glut medius, L piriformis Treatment response/outcome: Twitch Response Elicited and Palpable Increase in Muscle Length     04/03/2022 Therapeutic Exercise: to improve strength and mobility.  Demo, verbal and tactile cues throughout for technique. Bike L2 x 5 min  Standing extension over bar x 10 Bridges x 10  Prone leg extension x 10 bil  Prone on elbows with head extension x 10 Prone knee bends x 10 bil  Gait x 200' - cues for core engagement, heel strike.  Manual Therapy: to decrease muscle spasm and pain and improve mobility  L UPA mobs grade 3-4 lumbar spine, press-ups x 10 with L UPA mob/overpressure - increased tingling down thigh.  STM/TPR to L piriformis glut.         PATIENT EDUCATION:  Education details: HEP update 03/25/22, 9/25, 9/27, 10/2 Person educated: Patient Education method: Explanation, Demonstration, Verbal cues, and Handouts Education comprehension: verbalized understanding and returned demonstration   HOME EXERCISE PROGRAM: Access Code: [email or web address]  ASSESSMENT: CLINICAL IMPRESSION: DEAGO BURRUSS reports good compliance with HEP and back is feeling better today, but still having cramping down LLE.  Trialed prone press-ups with hips offset, also reviewed side glides and to only perform on side that relieves pressure.  We also trialed mechanical traction today, taking pull up to 90lbs, tolerated well, some stiffness afterwards.  Will increase pull to 50% BW next session.  DUBLIN GRAYER continues to demonstrate potential for improvement and would benefit from continued skilled therapy to address impairments.      OBJECTIVE IMPAIRMENTS Abnormal gait, decreased activity tolerance, decreased ROM, decreased strength,  hypomobility, increased muscle spasms, impaired flexibility, improper body mechanics, and pain.   ACTIVITY LIMITATIONS bending, squatting, sleeping, transfers, and  locomotion level  PARTICIPATION LIMITATIONS:  pain with most ADLS  PERSONAL FACTORS Time since onset of injury/illness/exacerbation and 3+ comorbidities: HTN, DM, kidney disease  are also affecting patient's functional outcome.   REHAB POTENTIAL: Excellent  CLINICAL DECISION MAKING: Stable/uncomplicated  EVALUATION COMPLEXITY: Low   GOALS: Goals reviewed with patient? Yes  SHORT TERM GOALS: Target date: 03/18/2022 (Remove Blue Hyperlink)  Patient will be independent with initial HEP.  Baseline:  Goal status: MET  2.  Patient will report regular centralization of radicular symptoms with prone press ups.  Baseline:  Goal status: MET  3.  Pt educated regarding ADL modifications and proper body mechanics Baseline:  Goal status: MET   LONG TERM GOALS: Target date: 04/15/2022  (Remove Blue Hyperlink)  Patient will be independent with advanced/ongoing HEP to improve outcomes and carryover.  Baseline:  Goal status: IN PROGRESS  2.  Patient will report 75% improvement in low back pain to improve QOL.  Baseline:  Goal status: IN PROGRESS about 70% improvement  3.  Patient to demonstrate ability to achieve and maintain good spinal alignment/posturing and body mechanics needed for daily activities. Baseline:  Goal status: IN PROGRESS 04/15/22  4.  Patient will demonstrate functional pain free lumbar ROM to perform ADLs.   Baseline:  Goal status: IN PROGRESS MET except end range flexion and ext still has pain  5.  Patient will demonstrate improved functional strength as demonstrated by ability to squat for correct body mechanics and lifting. Baseline:  Goal status: IN PROGRESS - 04/11/22 able to squat with good mechanics  6.  Patient will report <= 36% to demonstrate improved functional ability.  Baseline: 48%  eval Goal status: REVISED 04/15/22   7.  Patient will reports improved tolerance of sitting and driving by 92% or more. Baseline:  Goal status: IN PROGRESS- 04/11/22 70%  8.  Patient able to safely amb community distances with a non-antalgic gait pattern Baseline:  Goal status: IN PROGRESS 04/15/22  9.  Patient will report <= 47% disability on LEFS to demonstrate improved functional ability.  Baseline: 56% on 04/15/22 Goal status: NEW 04/15/22    PLAN: PT FREQUENCY: 2x/week  PT DURATION: 6 weeks  PLANNED INTERVENTIONS: Therapeutic exercises, Therapeutic activity, Neuromuscular re-education, Patient/Family education, Self Care, Joint mobilization, Joint manipulation, Aquatic Therapy, Dry Needling, Electrical stimulation, Spinal manipulation, Spinal mobilization, Cryotherapy, Moist heat, Taping, Traction, Ionotophoresis 37m/ml Dexamethasone, and Manual therapy.  PLAN FOR NEXT SESSION:   Complete modified Oswestry, trial of intermittent mechanical traction, continue focusing on lumbar stab, glute med strength, and sciatic nerve mobility.   ERennie Natter PT, DPT  04/17/2022, 10:16 AM

## 2022-04-22 ENCOUNTER — Ambulatory Visit: Payer: 59 | Admitting: Physical Therapy

## 2022-04-22 ENCOUNTER — Encounter: Payer: Self-pay | Admitting: Physical Therapy

## 2022-04-22 DIAGNOSIS — M5416 Radiculopathy, lumbar region: Secondary | ICD-10-CM | POA: Diagnosis not present

## 2022-04-22 DIAGNOSIS — R2689 Other abnormalities of gait and mobility: Secondary | ICD-10-CM

## 2022-04-22 DIAGNOSIS — M6281 Muscle weakness (generalized): Secondary | ICD-10-CM

## 2022-04-22 DIAGNOSIS — R252 Cramp and spasm: Secondary | ICD-10-CM

## 2022-04-22 NOTE — Therapy (Signed)
OUTPATIENT PHYSICAL THERAPY TREATMENT NOTE   Patient Name: Alexander [name] MRN: [id number] DOB:05/30/1971, 51 y.o., male Today's Date: 04/22/2022   PT End of Session - 04/22/22 0852     Visit Number 12    Number of Visits 18    Date for PT Re-Evaluation 05/13/22    Authorization Type UHC    PT Start Time 0852    PT Stop Time 0926    PT Time Calculation (min) 34 min    Activity Tolerance Patient tolerated treatment well    Behavior During Therapy [facility] for tasks assessed/performed                   Past Medical History:  Diagnosis Date   CHEST PAIN 08/28/2009   DIABETES MELLITUS, TYPE II 03/20/2007   HYPERCHOLESTEROLEMIA 07/21/2008   HYPERTENSION 03/20/2007   Hypertensive urgency 03/27/2015   Left ventricular dysfunction 03/28/2015   EF 40-45%, no WMA, grade 2 diastolic dysfunction   Renal disorder    Past Surgical History:  Procedure Laterality Date   KNEE ARTHROSCOPY Right    Patient Active Problem List   Diagnosis Date Noted   Aortic atherosclerosis ([place]) 02/25/2022   Acute left-sided low back pain with left-sided sciatica 02/24/2022   GERD (gastroesophageal reflux disease) 10/26/2021   Colon cancer screening 10/26/2021   Bilateral hearing loss 05/10/2021   Eustachian tube dysfunction, left 05/10/2021   Persistent proteinuria 11/16/2019   Hyperkalemia 08/27/2018   Hyperphosphatemia 04/09/2018   Orthostasis 12/16/2017   Chest pain 04/04/2017   Normocytic anemia 04/04/2017   AKI (acute kidney injury) ([place]) 11/14/2016   Routine general medical examination at a health care facility 10/11/2016   Labile hypertension 07/12/2016   Dizziness 11/28/2015   Vitamin D deficiency 11/09/2015   Anemia due to stage 4 chronic kidney disease ([place]) 11/09/2015   Benign hypertension with CKD (chronic kidney disease) stage IV ([place]) 09/29/2015   Diabetic nephropathy ([place]) 03/29/2015   Diabetes mellitus with stage 4 chronic kidney disease ([place]) 03/29/2015   Acute renal  failure ([place])    Dyslipidemia    Hypertensive urgency    Hypertensive heart disease with heart failure ([place]) 01/20/2015   Hypogonadism male 05/27/2012   Essential hypertension 03/20/2007    PCP: Hoyt Koch, MD   REFERRING PROVIDER: Girtha Rm, NP-C  REFERRING DIAG: [phone] (ICD-10-CM) - Acute left-sided low back pain with left-sided sciatica  Rationale for Evaluation and Treatment Rehabilitation  THERAPY DIAG:  Radiculopathy, lumbar region  Muscle weakness (generalized)  Other abnormalities of gait and mobility  Cramp and spasm  ONSET DATE: 2-3 weeks ago  SUBJECTIVE:  SUBJECTIVE STATEMENT: Doing better last 3 days, still discomfort in leg, can tell if does too much because the leg gets achy.   PAIN:  Are you having pain? Yes: NPRS scale: 3/10 Pain location: left hip, cramping in leg Pain description: sharpness, shooting, constant cramp Aggravating factors: sitting, driving Relieving factors: lying seat all the way back  PERTINENT HISTORY:  Rt knee scope, HTN, DM, heart failure 2016, kidney disease  PRECAUTIONS: None  WEIGHT BEARING RESTRICTIONS No  FALLS:  Has patient fallen in last 6 months? No  LIVING ENVIRONMENT: Lives with: lives with their family Lives in: House/apartment Stairs: Yes: Internal: 15 steps; can reach both Has following equipment at home: None  OCCUPATION: English as a second language teacher, sits mostly, also musician in church  PLOF: [place] it.   OBJECTIVE:   DIAGNOSTIC FINDINGS:  XR 02/25/22 Normal anatomic alignment. No evidence for acute fracture or dislocation. Preservation of the vertebral body heights. L4-5 degenerative disc disease. L5-S1 degenerative disc disease. L4-5 and L5-S1 facet degenerative changes. Extensive aortic  atherosclerosis  PATIENT SURVEYS:  Modified Oswestry 48% disability  LEFS 45/80 = 56.3% disabiltiy  04/15/22  SCREENING FOR RED FLAGS: Bowel or bladder incontinence: No Spinal tumors: No Cauda equina syndrome: No Compression fracture: No Abdominal aneurysm: No  COGNITION:  Overall cognitive status: Within functional limits for tasks assessed     SENSATION: WFL  MUSCLE LENGTH: Marked bil HS tightness (left limited by neural tension)  POSTURE: rounded shoulders, decreased lumbar lordosis, flexed trunk , and weight shift right  PALPATION: TTP at let gluteals, lumbar, lateral thigh, gastroc Spinal mobility: decreased UPA L4/5, L5/S1 left   LUMBAR ROM:   Active  AROM  eval 03/25/2022 04/15/22  Flexion 25%* 75% 75%  Extension 50% 75% WFL  Right lateral flexion WNL  WNL  Left lateral flexion WNL  WNL  Right rotation full  Full  Left rotation 90%  Full   (Blank rows = not tested)  LOWER EXTREMITY ROM:     Right LE WFL Left: limited by pain in hip flexion, knee ext/flex, ankle DF, else [facility]  LOWER EXTREMITY MMT:    MMT Right eval Left eval Right 04/15/22 Left 04/15/22  Hip flexion 4+ 4+ 4+ 4+  Hip extension _0 4+  Hip abduction 5 4*  4+  Hip adduction    5  Hip internal rotation 4+ 4-*  5  Hip external rotation 4+ 4-*  4+  Knee flexion 5 4-* 4+ 4+  Knee extension 5 4*  5  Ankle dorsiflexion 5 4-  4-  Ankle plantarflexion      Ankle inversion 5 4-    Ankle eversion 5 4-     (Blank rows = not tested)*pain  LUMBAR SPECIAL TESTS:  Straight leg raise test: Positive and Slump test: Positive  FUNCTIONAL TESTS:  Prone on elbows decreases pain; PPU 2x10 centralizes pain to left low back/hip   GAIT: Distance walked: 40 Assistive device utilized: None Level of assistance: Complete Independence Comments: left trendelenberg    TODAY'S TREATMENT  04/22/2022 Therapeutic Exercise: to improve strength and mobility.  Demo, verbal and tactile cues throughout for  technique. Bike L3 x 5 min   Mechanical  Lumbar Traction x 20 min including set-up and steps up, 15 min treatment time at 110 lbs max, 90 lbs min, 60 sec on/10 sec rest, 2 steps up, 2 steps down, with table at 20 deg decline, to decrease radicular symptoms and pain.   04/17/2022 Therapeutic Exercise: to  improve strength and mobility.  Demo, verbal and tactile cues throughout for technique. Bike L 2 x 5 min  Prone press-ups 2 x 10 with hips offset to L Mechanical  Lumbar Traction x 20 min including set-up and steps up, 15 min treatment time at 90 lbs max 75 lbs min, 60 sec on/10 sec rest, 2 steps up, 2 steps down, with table at 20 deg decline, to decrease radicular symptoms and pain.    04/15/22  Prone x 1 min decreases pain some.  PPU x 10  Manual:  Hooklying manual lumbar traction 3 x 20-30 sec; pt gets some relief with pull Skilled palpation and monitoring of soft tissues during DN STM to left gluteals and piriformis TPR to left QL Trigger Point Dry-Needling  Treatment instructions: Expect mild to moderate muscle soreness. S/S of pneumothorax if dry needled over a lung field, and to seek immediate medical attention should they occur. Patient verbalized understanding of these instructions and education.  Patient Consent Given: Yes Education handout provided: Previously provided Muscles treated: Left side Glute medius, piriformis, QL Electrical stimulation performed: No Parameters: N/A Treatment response/outcome: Twitch Response Elicited and Palpable Increase in Muscle Length    MMT, ROM, gait and goals assessed for recert.   04/11/22 Treadmill x 6 min 1 mph Supine bridge with ball squeeze 2x10 S/L clams x 15 red TB bil S/L hip abduction 2x10 bil S/L hip extension 2x10 bil RDL x 10  Manual Therapy: STM to bil glutes, piriformis    PATIENT EDUCATION:  Education details: HEP update 03/25/22, 9/25, 9/27, 10/2 Person educated: Patient Education method: Consulting civil engineer,  Demonstration, Verbal cues, and Handouts Education comprehension: verbalized understanding and returned demonstration   HOME EXERCISE PROGRAM: Access Code: [email or web address]  ASSESSMENT: CLINICAL IMPRESSION: Alexander [name] still demonstrates LLE weakness and altered gait, but less pain, but reported improvement in symptoms/pain following traction last session, so increased pull today closer to 50% of BW.  He tolerated well and reported could feel pain/nerve tension decreasing in LLE  during traction.  Alexander [name] continues to demonstrate potential for improvement and would benefit from continued skilled therapy to address impairments.      OBJECTIVE IMPAIRMENTS Abnormal gait, decreased activity tolerance, decreased ROM, decreased strength, hypomobility, increased muscle spasms, impaired flexibility, improper body mechanics, and pain.   ACTIVITY LIMITATIONS bending, squatting, sleeping, transfers, and locomotion level  PARTICIPATION LIMITATIONS:  pain with most ADLS  PERSONAL FACTORS Time since onset of injury/illness/exacerbation and 3+ comorbidities: HTN, DM, kidney disease  are also affecting patient's functional outcome.   REHAB POTENTIAL: Excellent  CLINICAL DECISION MAKING: Stable/uncomplicated  EVALUATION COMPLEXITY: Low   GOALS: Goals reviewed with patient? Yes  SHORT TERM GOALS: Target date: 03/18/2022 (Remove Blue Hyperlink)  Patient will be independent with initial HEP.  Baseline:  Goal status: MET  2.  Patient will report regular centralization of radicular symptoms with prone press ups.  Baseline:  Goal status: MET  3.  Pt educated regarding ADL modifications and proper body mechanics Baseline:  Goal status: MET   LONG TERM GOALS: Target date: 04/15/2022  (Remove Blue Hyperlink)  Patient will be independent with advanced/ongoing HEP to improve outcomes and carryover.  Baseline:  Goal status: IN PROGRESS  2.  Patient will report 75% improvement in low back  pain to improve QOL.  Baseline:  Goal status: IN PROGRESS about 70% improvement  3.  Patient to demonstrate ability to achieve and maintain good spinal alignment/posturing and body mechanics needed for daily activities. Baseline:  Goal status: IN PROGRESS 04/15/22  4.  Patient will demonstrate functional pain free lumbar ROM to perform ADLs.   Baseline:  Goal status: IN PROGRESS MET except end range flexion and ext still has pain  5.  Patient will demonstrate improved functional strength as demonstrated by ability to squat for correct body mechanics and lifting. Baseline:  Goal status: IN PROGRESS - 04/11/22 able to squat with good mechanics  6.  Patient will report <= 36% to demonstrate improved functional ability.  Baseline: 48% eval Goal status: REVISED 04/15/22   7.  Patient will reports improved tolerance of sitting and driving by 25% or more. Baseline:  Goal status: IN PROGRESS- 04/11/22 70%  8.  Patient able to safely amb community distances with a non-antalgic gait pattern Baseline:  Goal status: IN PROGRESS 04/15/22  9.  Patient will report <= 47% disability on LEFS to demonstrate improved functional ability.  Baseline: 56% on 04/15/22 Goal status: NEW 04/15/22    PLAN: PT FREQUENCY: 2x/week  PT DURATION: 6 weeks  PLANNED INTERVENTIONS: Therapeutic exercises, Therapeutic activity, Neuromuscular re-education, Patient/Family education, Self Care, Joint mobilization, Joint manipulation, Aquatic Therapy, Dry Needling, Electrical stimulation, Spinal manipulation, Spinal mobilization, Cryotherapy, Moist heat, Taping, Traction, Ionotophoresis 33m/ml Dexamethasone, and Manual therapy.  PLAN FOR NEXT SESSION:   Complete modified Oswestry, trial of intermittent mechanical traction, continue focusing on lumbar stab, glute med strength, and sciatic nerve mobility.   ERennie Natter PT, DPT  04/22/2022, 9:27 AM

## 2022-04-24 ENCOUNTER — Ambulatory Visit: Payer: 59

## 2022-04-24 DIAGNOSIS — M5416 Radiculopathy, lumbar region: Secondary | ICD-10-CM

## 2022-04-24 DIAGNOSIS — R252 Cramp and spasm: Secondary | ICD-10-CM

## 2022-04-24 DIAGNOSIS — M6281 Muscle weakness (generalized): Secondary | ICD-10-CM

## 2022-04-24 DIAGNOSIS — R2689 Other abnormalities of gait and mobility: Secondary | ICD-10-CM

## 2022-04-24 NOTE — Therapy (Signed)
OUTPATIENT PHYSICAL THERAPY TREATMENT NOTE   Patient Name: Alexander [name] MRN: [id number] DOB:1970/10/24, 51 y.o., male Today's Date: 04/24/2022   PT End of Session - 04/24/22 1026     Visit Number 13    Number of Visits 18    Date for PT Re-Evaluation 05/13/22    Authorization Type UHC    PT Start Time 0853   pt late   PT Stop Time 0929    PT Time Calculation (min) 36 min    Activity Tolerance Patient tolerated treatment well    Behavior During Therapy [facility] for tasks assessed/performed                    Past Medical History:  Diagnosis Date   CHEST PAIN 08/28/2009   DIABETES MELLITUS, TYPE II 03/20/2007   HYPERCHOLESTEROLEMIA 07/21/2008   HYPERTENSION 03/20/2007   Hypertensive urgency 03/27/2015   Left ventricular dysfunction 03/28/2015   EF 40-45%, no WMA, grade 2 diastolic dysfunction   Renal disorder    Past Surgical History:  Procedure Laterality Date   KNEE ARTHROSCOPY Right    Patient Active Problem List   Diagnosis Date Noted   Aortic atherosclerosis ([place]) 02/25/2022   Acute left-sided low back pain with left-sided sciatica 02/24/2022   GERD (gastroesophageal reflux disease) 10/26/2021   Colon cancer screening 10/26/2021   Bilateral hearing loss 05/10/2021   Eustachian tube dysfunction, left 05/10/2021   Persistent proteinuria 11/16/2019   Hyperkalemia 08/27/2018   Hyperphosphatemia 04/09/2018   Orthostasis 12/16/2017   Chest pain 04/04/2017   Normocytic anemia 04/04/2017   AKI (acute kidney injury) ([place]) 11/14/2016   Routine general medical examination at a health care facility 10/11/2016   Labile hypertension 07/12/2016   Dizziness 11/28/2015   Vitamin D deficiency 11/09/2015   Anemia due to stage 4 chronic kidney disease ([place]) 11/09/2015   Benign hypertension with CKD (chronic kidney disease) stage IV ([place]) 09/29/2015   Diabetic nephropathy ([place]) 03/29/2015   Diabetes mellitus with stage 4 chronic kidney disease ([place]) 03/29/2015   Acute  renal failure ([place])    Dyslipidemia    Hypertensive urgency    Hypertensive heart disease with heart failure ([place]) 01/20/2015   Hypogonadism male 05/27/2012   Essential hypertension 03/20/2007    PCP: Hoyt Koch, MD   REFERRING PROVIDER: Girtha Rm, NP-C  REFERRING DIAG: [phone] (ICD-10-CM) - Acute left-sided low back pain with left-sided sciatica  Rationale for Evaluation and Treatment Rehabilitation  THERAPY DIAG:  Radiculopathy, lumbar region  Muscle weakness (generalized)  Other abnormalities of gait and mobility  Cramp and spasm  ONSET DATE: 2-3 weeks ago  SUBJECTIVE:  SUBJECTIVE STATEMENT: Been doing ok, pain is not so bad but still there, the traction helped a little.  PAIN:  Are you having pain? Yes: NPRS scale: 4/10 Pain location: left hip, cramping in leg Pain description: sharpness, shooting, constant cramp Aggravating factors: sitting, driving Relieving factors: lying seat all the way back  PERTINENT HISTORY:  Rt knee scope, HTN, DM, heart failure 2016, kidney disease  PRECAUTIONS: None  WEIGHT BEARING RESTRICTIONS No  FALLS:  Has patient fallen in last 6 months? No  LIVING ENVIRONMENT: Lives with: lives with their family Lives in: House/apartment Stairs: Yes: Internal: 15 steps; can reach both Has following equipment at home: None  OCCUPATION: English as a second language teacher, sits mostly, also musician in church  PLOF: [place] [employer].   OBJECTIVE:   DIAGNOSTIC FINDINGS:  XR 02/25/22 Normal anatomic alignment. No evidence for acute fracture or dislocation. Preservation of the vertebral body heights. L4-5 degenerative disc disease. L5-S1 degenerative disc disease. L4-5 and L5-S1 facet degenerative changes. Extensive aortic  atherosclerosis  PATIENT SURVEYS:  Modified Oswestry 48% disability  LEFS 45/80 = 56.3% disabiltiy  04/15/22  SCREENING FOR RED FLAGS: Bowel or bladder incontinence: No Spinal tumors: No Cauda equina syndrome: No Compression fracture: No Abdominal aneurysm: No  COGNITION:  Overall cognitive status: Within functional limits for tasks assessed     SENSATION: WFL  MUSCLE LENGTH: Marked bil HS tightness (left limited by neural tension)  POSTURE: rounded shoulders, decreased lumbar lordosis, flexed trunk , and weight shift right  PALPATION: TTP at let gluteals, lumbar, lateral thigh, gastroc Spinal mobility: decreased UPA L4/5, L5/S1 left   LUMBAR ROM:   Active  AROM  eval 03/25/2022 04/15/22  Flexion 25%* 75% 75%  Extension 50% 75% WFL  Right lateral flexion WNL  WNL  Left lateral flexion WNL  WNL  Right rotation full  Full  Left rotation 90%  Full   (Blank rows = not tested)  LOWER EXTREMITY ROM:     Right LE WFL Left: limited by pain in hip flexion, knee ext/flex, ankle DF, else [facility]  LOWER EXTREMITY MMT:    MMT Right eval Left eval Right 04/15/22 Left 04/15/22  Hip flexion 4+ 4+ 4+ 4+  Hip extension _0 4+  Hip abduction 5 4*  4+  Hip adduction    5  Hip internal rotation 4+ 4-*  5  Hip external rotation 4+ 4-*  4+  Knee flexion 5 4-* 4+ 4+  Knee extension 5 4*  5  Ankle dorsiflexion 5 4-  4-  Ankle plantarflexion      Ankle inversion 5 4-    Ankle eversion 5 4-     (Blank rows = not tested)*pain  LUMBAR SPECIAL TESTS:  Straight leg raise test: Positive and Slump test: Positive  FUNCTIONAL TESTS:  Prone on elbows decreases pain; PPU 2x10 centralizes pain to left low back/hip   GAIT: Distance walked: 40 Assistive device utilized: None Level of assistance: Complete Independence Comments: left trendelenberg    TODAY'S TREATMENT  04/24/22 Therapeutic Exercise: to improve strength and mobility.  Demo, verbal and tactile cues throughout for  technique. Bike L2x4mn Mechanical  Lumbar Traction x 20 min including set-up and steps up, 15 min treatment time at 110 lbs max, 90 lbs min, 60 sec on/10 sec rest, 2 steps up, 2 steps down, with table at 20 deg decline, to decrease radicular symptoms and pain.   04/22/2022 Therapeutic Exercise: to improve strength and mobility.  Demo, verbal and tactile cues  throughout for technique. Bike L3 x 5 min   Mechanical  Lumbar Traction x 20 min including set-up and steps up, 15 min treatment time at 110 lbs max, 90 lbs min, 60 sec on/10 sec rest, 2 steps up, 2 steps down, with table at 20 deg decline, to decrease radicular symptoms and pain.   04/17/2022 Therapeutic Exercise: to improve strength and mobility.  Demo, verbal and tactile cues throughout for technique. Bike L 2 x 5 min  Prone press-ups 2 x 10 with hips offset to L Mechanical  Lumbar Traction x 20 min including set-up and steps up, 15 min treatment time at 90 lbs max 75 lbs min, 60 sec on/10 sec rest, 2 steps up, 2 steps down, with table at 20 deg decline, to decrease radicular symptoms and pain.     PATIENT EDUCATION:  Education details: HEP update 03/25/22, 9/25, 9/27, 10/2 Person educated: Patient Education method: Explanation, Demonstration, Verbal cues, and Handouts Education comprehension: verbalized understanding and returned demonstration   HOME EXERCISE PROGRAM: Access Code: [email or web address]  ASSESSMENT: CLINICAL IMPRESSION: Mr. Penley reports that his pain has not worsened since last visit. He noted improvement from mechanical traction today so we focused on this during the session. During traction he noted a good pull and ease of radicular symptoms on LLE. After traction he showed less gait deviation and decreased pain in L low back and hip.    OBJECTIVE IMPAIRMENTS Abnormal gait, decreased activity tolerance, decreased ROM, decreased strength, hypomobility, increased muscle spasms, impaired flexibility, improper body  mechanics, and pain.   ACTIVITY LIMITATIONS bending, squatting, sleeping, transfers, and locomotion level  PARTICIPATION LIMITATIONS:  pain with most ADLS  PERSONAL FACTORS Time since onset of injury/illness/exacerbation and 3+ comorbidities: HTN, DM, kidney disease  are also affecting patient's functional outcome.   REHAB POTENTIAL: Excellent  CLINICAL DECISION MAKING: Stable/uncomplicated  EVALUATION COMPLEXITY: Low   GOALS: Goals reviewed with patient? Yes  SHORT TERM GOALS: Target date: 03/18/2022 (Remove Blue Hyperlink)  Patient will be independent with initial HEP.  Baseline:  Goal status: MET  2.  Patient will report regular centralization of radicular symptoms with prone press ups.  Baseline:  Goal status: MET  3.  Pt educated regarding ADL modifications and proper body mechanics Baseline:  Goal status: MET   LONG TERM GOALS: Target date: 04/15/2022  (Remove Blue Hyperlink)  Patient will be independent with advanced/ongoing HEP to improve outcomes and carryover.  Baseline:  Goal status: IN PROGRESS  2.  Patient will report 75% improvement in low back pain to improve QOL.  Baseline:  Goal status: IN PROGRESS about 70% improvement  3.  Patient to demonstrate ability to achieve and maintain good spinal alignment/posturing and body mechanics needed for daily activities. Baseline:  Goal status: IN PROGRESS 04/15/22  4.  Patient will demonstrate functional pain free lumbar ROM to perform ADLs.   Baseline:  Goal status: IN PROGRESS MET except end range flexion and ext still has pain  5.  Patient will demonstrate improved functional strength as demonstrated by ability to squat for correct body mechanics and lifting. Baseline:  Goal status: IN PROGRESS - 04/11/22 able to squat with good mechanics  6.  Patient will report <= 36% to demonstrate improved functional ability.  Baseline: 48% eval Goal status: REVISED 04/15/22   7.  Patient will reports improved  tolerance of sitting and driving by 35% or more. Baseline:  Goal status: IN PROGRESS- 04/11/22 70%  8.  Patient able to safely amb community distances  with a non-antalgic gait pattern Baseline:  Goal status: IN PROGRESS 04/15/22  9.  Patient will report <= 47% disability on LEFS to demonstrate improved functional ability.  Baseline: 56% on 04/15/22 Goal status: NEW 04/15/22    PLAN: PT FREQUENCY: 2x/week  PT DURATION: 6 weeks  PLANNED INTERVENTIONS: Therapeutic exercises, Therapeutic activity, Neuromuscular re-education, Patient/Family education, Self Care, Joint mobilization, Joint manipulation, Aquatic Therapy, Dry Needling, Electrical stimulation, Spinal manipulation, Spinal mobilization, Cryotherapy, Moist heat, Taping, Traction, Ionotophoresis 48m/ml Dexamethasone, and Manual therapy.  PLAN FOR NEXT SESSION:   Complete modified Oswestry, trial of intermittent mechanical traction, continue focusing on lumbar stab, glute med strength, and sciatic nerve mobility.   BArtist Pais PTA, DPT  04/24/2022, 10:27 AM

## 2022-04-29 ENCOUNTER — Encounter: Payer: Self-pay | Admitting: Physical Therapy

## 2022-04-29 ENCOUNTER — Ambulatory Visit: Payer: 59 | Admitting: Physical Therapy

## 2022-04-29 DIAGNOSIS — R252 Cramp and spasm: Secondary | ICD-10-CM

## 2022-04-29 DIAGNOSIS — R2689 Other abnormalities of gait and mobility: Secondary | ICD-10-CM

## 2022-04-29 DIAGNOSIS — M6281 Muscle weakness (generalized): Secondary | ICD-10-CM

## 2022-04-29 DIAGNOSIS — M5416 Radiculopathy, lumbar region: Secondary | ICD-10-CM | POA: Diagnosis not present

## 2022-04-29 NOTE — Therapy (Signed)
OUTPATIENT PHYSICAL THERAPY TREATMENT NOTE   Patient Name: Alexander [name] MRN: [id number] DOB:06-25-1971, 51 y.o., male Today's Date: 04/29/2022   PT End of Session - 04/29/22 0937     Visit Number 14    Number of Visits 18    Date for PT Re-Evaluation 05/13/22    Authorization Type UHC    PT Start Time 0935    PT Stop Time 1010    PT Time Calculation (min) 35 min    Activity Tolerance Patient tolerated treatment well    Behavior During Therapy [facility] for tasks assessed/performed                    Past Medical History:  Diagnosis Date   CHEST PAIN 08/28/2009   DIABETES MELLITUS, TYPE II 03/20/2007   HYPERCHOLESTEROLEMIA 07/21/2008   HYPERTENSION 03/20/2007   Hypertensive urgency 03/27/2015   Left ventricular dysfunction 03/28/2015   EF 40-45%, no WMA, grade 2 diastolic dysfunction   Renal disorder    Past Surgical History:  Procedure Laterality Date   KNEE ARTHROSCOPY Right    Patient Active Problem List   Diagnosis Date Noted   Aortic atherosclerosis ([place]) 02/25/2022   Acute left-sided low back pain with left-sided sciatica 02/24/2022   GERD (gastroesophageal reflux disease) 10/26/2021   Colon cancer screening 10/26/2021   Bilateral hearing loss 05/10/2021   Eustachian tube dysfunction, left 05/10/2021   Persistent proteinuria 11/16/2019   Hyperkalemia 08/27/2018   Hyperphosphatemia 04/09/2018   Orthostasis 12/16/2017   Chest pain 04/04/2017   Normocytic anemia 04/04/2017   AKI (acute kidney injury) ([place]) 11/14/2016   Routine general medical examination at a health care facility 10/11/2016   Labile hypertension 07/12/2016   Dizziness 11/28/2015   Vitamin D deficiency 11/09/2015   Anemia due to stage 4 chronic kidney disease ([place]) 11/09/2015   Benign hypertension with CKD (chronic kidney disease) stage IV ([place]) 09/29/2015   Diabetic nephropathy ([place]) 03/29/2015   Diabetes mellitus with stage 4 chronic kidney disease ([place]) 03/29/2015   Acute renal  failure ([place])    Dyslipidemia    Hypertensive urgency    Hypertensive heart disease with heart failure ([place]) 01/20/2015   Hypogonadism male 05/27/2012   Essential hypertension 03/20/2007    PCP: Hoyt Koch, MD   REFERRING PROVIDER: Girtha Rm, NP-C  REFERRING DIAG: [id number] (ICD-10-CM) - Acute left-sided low back pain with left-sided sciatica  Rationale for Evaluation and Treatment Rehabilitation  THERAPY DIAG:  Radiculopathy, lumbar region  Muscle weakness (generalized)  Other abnormalities of gait and mobility  Cramp and spasm  ONSET DATE: 2-3 weeks ago  SUBJECTIVE:  SUBJECTIVE STATEMENT: Thinks he overdid this weekend, did a lot of walking.   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  Limping today.    He does feel a lot better overall but leg still gets achy/crampy.  Has follow-up with referring provider tomorrow. (Internal medicine).   Good compliance with exercises.   PAIN:  Are you having pain? Yes: NPRS scale: 6-7/10 Pain location: left hip, cramping in leg Pain description: sharpness, shooting, constant cramp Aggravating factors: sitting, driving Relieving factors: lying seat all the way back  PERTINENT HISTORY:  Rt knee scope, HTN, DM, heart failure 2016, kidney disease  PRECAUTIONS: None  WEIGHT BEARING RESTRICTIONS No  FALLS:  Has patient fallen in last 6 months? No  LIVING ENVIRONMENT: Lives with: lives with their family Lives in: House/apartment Stairs: Yes: Internal: 15 steps; can reach both Has following equipment at home: None  OCCUPATION: English as a second language teacher, sits mostly, also musician in church  PLOF: [place] [employer].   OBJECTIVE:   DIAGNOSTIC FINDINGS:  XR 02/25/22 Normal anatomic alignment. No evidence for acute fracture  or dislocation. Preservation of the vertebral body heights. L4-5 degenerative disc disease. L5-S1 degenerative disc disease. L4-5 and L5-S1 facet degenerative changes. Extensive aortic atherosclerosis  PATIENT SURVEYS:  Modified Oswestry 48% disability  LEFS 45/80 = 56.3% disabiltiy  04/15/22  SCREENING FOR RED FLAGS: Bowel or bladder incontinence: No Spinal tumors: No Cauda equina syndrome: No Compression fracture: No Abdominal aneurysm: No  COGNITION:  Overall cognitive status: Within functional limits for tasks assessed     SENSATION: WFL  MUSCLE LENGTH: Marked bil HS tightness (left limited by neural tension)  POSTURE: rounded shoulders, decreased lumbar lordosis, flexed trunk , and weight shift right  PALPATION: TTP at let gluteals, lumbar, lateral thigh, gastroc Spinal mobility: decreased UPA L4/5, L5/S1 left   LUMBAR ROM:   Active  AROM  eval 03/25/2022 04/15/22  Flexion 25%* 75% 75%  Extension 50% 75% WFL  Right lateral flexion WNL  WNL  Left lateral flexion WNL  WNL  Right rotation full  Full  Left rotation 90%  Full   (Blank rows = not tested)  LOWER EXTREMITY ROM:     Right LE WFL Left: limited by pain in hip flexion, knee ext/flex, ankle DF, else [facility]  LOWER EXTREMITY MMT:    MMT Right eval Left eval Right 04/15/22 Left 04/15/22  Hip flexion 4+ 4+ 4+ 4+  Hip extension _0 4+  Hip abduction 5 4*  4+  Hip adduction    5  Hip internal rotation 4+ 4-*  5  Hip external rotation 4+ 4-*  4+  Knee flexion 5 4-* 4+ 4+  Knee extension 5 4*  5  Ankle dorsiflexion 5 4-  4-  Ankle plantarflexion      Ankle inversion 5 4-    Ankle eversion 5 4-     (Blank rows = not tested)*pain  LUMBAR SPECIAL TESTS:  Straight leg raise test: Positive and Slump test: Positive  FUNCTIONAL TESTS:  Prone on elbows decreases pain; PPU 2x10 centralizes pain to left low back/hip   GAIT: Distance walked: 40 Assistive device utilized: None Level of assistance: Complete  Independence Comments: left trendelenberg    TODAY'S TREATMENT  04/29/2022 Therapeutic Exercise: to improve strength and mobility.  Demo, verbal and tactile cues throughout for technique. Bike L2x42mn Reviewed HEP Mechanical  Lumbar Traction x 20 min including set-up and steps up, 15 min treatment time at 115 lbs max,  100 lbs min, 60 sec on/10 sec rest, 2 steps up, 2 steps down, with table at 20 deg decline, to decrease radicular symptoms and pain.   04/24/22 Therapeutic Exercise: to improve strength and mobility.  Demo, verbal and tactile cues throughout for technique. Bike L2x69mn Mechanical  Lumbar Traction x 20 min including set-up and steps up, 15 min treatment time at 110 lbs max, 90 lbs min, 60 sec on/10 sec rest, 2 steps up, 2 steps down, with table at 20 deg decline, to decrease radicular symptoms and pain.  04/22/2022 Therapeutic Exercise: to improve strength and mobility.  Demo, verbal and tactile cues throughout for technique. Bike L3 x 5 min   Mechanical  Lumbar Traction x 20 min including set-up and steps up, 15 min treatment time at 110 lbs max, 90 lbs min, 60 sec on/10 sec rest, 2 steps up, 2 steps down, with table at 20 deg decline, to decrease radicular symptoms and pain.     PATIENT EDUCATION:  Education details: HEP update 03/25/22, 9/25, 9/27, 10/2 Person educated: Patient Education method: EConsulting civil engineer Demonstration, Verbal cues, and Handouts Education comprehension: verbalized understanding and returned demonstration   HOME EXERCISE PROGRAM: Access Code: [email or web address]  ASSESSMENT: CLINICAL IMPRESSION: Mr. BCromiereports increased pain in LLE after being more active this weekend, and his gait was much more antalgic.  Reported decreased pain following traction and less limping.  We reviewed HEP, recommended setting timer and performing hourly at work.  Also recommended requesting referral to orthopedist for continued radicular symptoms at [place].  MCHADLEY DZIEDZICcontinues to demonstrate potential for improvement and would benefit from continued skilled therapy to address impairments.       OBJECTIVE IMPAIRMENTS Abnormal gait, decreased activity tolerance, decreased ROM, decreased strength, hypomobility, increased muscle spasms, impaired flexibility, improper body mechanics, and pain.   ACTIVITY LIMITATIONS bending, squatting, sleeping, transfers, and locomotion level  PARTICIPATION LIMITATIONS:  pain with most ADLS  PERSONAL FACTORS Time since onset of injury/illness/exacerbation and 3+ comorbidities: HTN, DM, kidney disease  are also affecting patient's functional outcome.   REHAB POTENTIAL: Excellent  CLINICAL DECISION MAKING: Stable/uncomplicated  EVALUATION COMPLEXITY: Low   GOALS: Goals reviewed with patient? Yes  SHORT TERM GOALS: Target date: 03/18/2022   Patient will be independent with initial HEP.  Baseline:  Goal status: MET  2.  Patient will report regular centralization of radicular symptoms with prone press ups.  Baseline:  Goal status: MET  3.  Pt educated regarding ADL modifications and proper body mechanics Baseline:  Goal status: MET   LONG TERM GOALS: Target date: 04/15/2022    Patient will be independent with advanced/ongoing HEP to improve outcomes and carryover.  Baseline:  Goal status: IN PROGRESS  2.  Patient will report 75% improvement in low back pain to improve QOL.  Baseline:  Goal status: IN PROGRESS about 70% improvement  3.  Patient to demonstrate ability to achieve and maintain good spinal alignment/posturing and body mechanics needed for daily activities. Baseline:  Goal status: IN PROGRESS 04/15/22  4.  Patient will demonstrate functional pain free lumbar ROM to perform ADLs.   Baseline:  Goal status: IN PROGRESS MET except end range flexion and ext still has pain  5.  Patient will demonstrate improved functional strength as demonstrated by ability to squat for correct  body mechanics and lifting. Baseline:  Goal status: IN PROGRESS - 04/11/22 able to squat with good mechanics  6.  Patient will report <= 36% to demonstrate improved functional ability.  Baseline: 48% eval Goal status: REVISED 04/15/22   7.  Patient will reports improved tolerance of sitting and driving by 75% or more. Baseline:  Goal status: IN PROGRESS- 04/11/22 70%  8.  Patient able to safely amb community distances with a non-antalgic gait pattern Baseline:  Goal status: IN PROGRESS 04/15/22  9.  Patient will report <= 47% disability on LEFS to demonstrate improved functional ability.  Baseline: 56% on 04/15/22 Goal status: IN PROGRESS 04/15/22    PLAN: PT FREQUENCY: 2x/week  PT DURATION: 6 weeks  PLANNED INTERVENTIONS: Therapeutic exercises, Therapeutic activity, Neuromuscular re-education, Patient/Family education, Self Care, Joint mobilization, Joint manipulation, Aquatic Therapy, Dry Needling, Electrical stimulation, Spinal manipulation, Spinal mobilization, Cryotherapy, Moist heat, Taping, Traction, Ionotophoresis 4mg/ml Dexamethasone, and Manual therapy.  PLAN FOR NEXT SESSION:   mechanical traction, continue to progress HEP, manual therapy/modalities PRN   Elizabeth J Whitman, PT, DPT  04/29/2022, 10:14 AM 

## 2022-04-30 ENCOUNTER — Ambulatory Visit: Payer: 59 | Admitting: Family Medicine

## 2022-05-01 ENCOUNTER — Ambulatory Visit: Payer: 59 | Admitting: Family Medicine

## 2022-05-01 ENCOUNTER — Encounter: Payer: Self-pay | Admitting: Family Medicine

## 2022-05-01 VITALS — BP 138/84 | HR 78 | Temp 97.6°F | Ht 75.0 in | Wt 252.0 lb

## 2022-05-01 DIAGNOSIS — N184 Chronic kidney disease, stage 4 (severe): Secondary | ICD-10-CM

## 2022-05-01 DIAGNOSIS — I110 Hypertensive heart disease with heart failure: Secondary | ICD-10-CM | POA: Diagnosis not present

## 2022-05-01 DIAGNOSIS — M5417 Radiculopathy, lumbosacral region: Secondary | ICD-10-CM | POA: Insufficient documentation

## 2022-05-01 DIAGNOSIS — E1122 Type 2 diabetes mellitus with diabetic chronic kidney disease: Secondary | ICD-10-CM | POA: Diagnosis not present

## 2022-05-01 MED ORDER — TIZANIDINE HCL 4 MG PO TABS: 4.0000 mg | ORAL_TABLET | Freq: Four times a day (QID) | ORAL | 0 refills | Status: DC | PRN

## 2022-05-01 MED ORDER — METHYLPREDNISOLONE 4 MG PO TBPK: ORAL_TABLET | ORAL | 0 refills | Status: DC

## 2022-05-01 NOTE — Assessment & Plan Note (Signed)
Approximate 10 to 12-week history of left low back pain predominantly piriformis with radiculopathy.  Continues having flareups with prolonged sitting.  Visibly uncomfortable.  Completed 6-weeks of PT and will continue PT for now. Continue ice or heat, topical analgesic.  He must avoid NSAIDs due to stage IV renal disease. Short course of Depo-Medrol and Zanaflex prescribed.  Counseling on stopping the steroids if he notices edema, fluid retention, elevated blood pressures.  Counseling on the sedating nature of Zanaflex and to avoid driving or alcohol. Referral to Ortho care for further evaluation and treatment.

## 2022-05-01 NOTE — Progress Notes (Signed)
Subjective:     Patient ID: Alexander [name], male    DOB: 1971/02/19, 51 y.o.   MRN: [id number]  Chief Complaint  Patient presents with   Follow-up    F/u on pain on left leg. States it is not radiating down as much and is more localized to his left glute. Happens when he is standing too long and it starts to cramp up    HPI Patient is in today for left buttock pain. Pain is worse in the morning and with prolonged sitting or standing.  Pain and sharp and shoots down his left leg to his ankle.  Pain currently 6/10. States he was sitting a lot of over the weekend and thinks it flared up his current symptoms.   He has been doing PT twice weekly x 6 wks. He has had dry needling and muscle traction.   Hx of ruptured lumbar disc several years ago. Denies back pain.   Stage 4 renal disease and diabetes.    Denies fever, chills, dizziness, chest pain, palpitations, shortness of breath, abdominal pain, N/V/D, urinary symptoms, LE edema.    Health Maintenance Due  Topic Date Due   Diabetic kidney evaluation - Urine ACR  10/28/2015   Zoster Vaccines- Shingrix (1 of 2) Never done    Past Medical History:  Diagnosis Date   CHEST PAIN 08/28/2009   DIABETES MELLITUS, TYPE II 03/20/2007   HYPERCHOLESTEROLEMIA 07/21/2008   HYPERTENSION 03/20/2007   Hypertensive urgency 03/27/2015   Left ventricular dysfunction 03/28/2015   EF 40-45%, no WMA, grade 2 diastolic dysfunction   Renal disorder     Past Surgical History:  Procedure Laterality Date   KNEE ARTHROSCOPY Right     Family History  Problem Relation Age of Onset   Diabetes Mother    Diabetes Father    Hypertension Father    Diabetes Maternal Grandfather    Diabetes Paternal Grandfather    Diabetes Brother    Hypertension Brother     Social History   Socioeconomic History   Marital status: Married    Spouse name: Not on file   Number of children: Not on file   Years of education: Not on file   Highest education level:  Not on file  Occupational History   Occupation: Training and development officer: [employer]  Tobacco Use   Smoking status: Never   Smokeless tobacco: Never  Vaping Use   Vaping Use: Never used  Substance and Sexual Activity   Alcohol use: No   Drug use: No   Sexual activity: Not on file  Other Topics Concern   Not on file  Social History Narrative   Not on file   Social Determinants of Health   Financial Resource Strain: Not on file  Food Insecurity: Not on file  Transportation Needs: Not on file  Physical Activity: Not on file  Stress: Not on file  Social Connections: Not on file  Intimate Partner Violence: Not on file    Outpatient Medications Prior to Visit  Medication Sig Dispense Refill   aspirin EC 81 MG EC tablet Take 1 tablet (81 mg total) by mouth daily.     atorvastatin (LIPITOR) 40 MG tablet TAKE 1 TABLET (40 MG TOTAL) BY MOUTH DAILY. KEEP UPCOMING APPOINTMENT FOR FUTURE REFILLS. 90 tablet 0   BD PEN NEEDLE NANO U/F 32G X 4 MM MISC USE DAILY AS DIRECTED 100 each 3   carvedilol (COREG) 12.5 MG tablet TAKE 1 TABLET (12.'5MG'$  TOTAL) BY  MOUTH TWICE A DAY WITH MEALS 180 tablet 1   Dulaglutide (TRULICITY) 4.5 QI/3.4VQ SOPN Inject 4.5 mg as directed once a week. 6 mL 3   furosemide (LASIX) 20 MG tablet TAKE 1 TABLET (20 MG TOTAL) BY MOUTH DAILY AS NEEDED (FOR SHORTNESS OF BREATH AND SWELLING.). 30 tablet 0   glucose blood (FREESTYLE LITE) test strip 1 each by Other route 2 (two) times daily. And lancets 2/day 200 each 3   hydrALAZINE (APRESOLINE) 50 MG tablet Take 1 tablet by mouth 2 (two) times daily.     insulin detemir (LEVEMIR FLEXTOUCH) 100 UNIT/ML FlexPen Inject 34 Units into the skin at bedtime. And pen needles 1/day 30 mL 3   Lancets (FREESTYLE) lancets Use to monitor glucose levels BID; E11.22 100 each 12   nitroGLYCERIN (NITROSTAT) 0.4 MG SL tablet Place 1 tablet (0.4 mg total) under the tongue every 5 (five) minutes x 3 doses as needed for chest pain. Don't take within 24 hrs  of Cialis 25 tablet 0   Semaglutide, 1 MG/DOSE, 4 MG/3ML SOPN Inject 1 mg as directed once a week. 9 mL 3   Vitamin D, Ergocalciferol, (DRISDOL) 1.25 MG (50000 UNIT) CAPS capsule Take 1 capsule (50,000 Units total) by mouth every Monday. 5 capsule 2   No facility-administered medications prior to visit.    No Known Allergies  ROS     Objective:    Physical Exam Constitutional:      General: He is not in acute distress.    Appearance: He is not ill-appearing.  Cardiovascular:     Rate and Rhythm: Normal rate.  Pulmonary:     Effort: Pulmonary effort is normal.  Musculoskeletal:     Lumbar back: Positive left straight leg raise test.     Right lower leg: No edema.     Left lower leg: No edema.     Comments: TTP over left sciatic notch and pyriformis muscle. Mild left leg weakness compared to right. LLE is neurovascularly intact.   Skin:    General: Skin is warm and dry.  Neurological:     Mental Status: He is alert and oriented to person, place, and time.     Gait: Gait abnormal.  Psychiatric:        Mood and Affect: Mood normal.        Behavior: Behavior normal.        Thought Content: Thought content normal.     BP 138/84 (BP Location: Left Arm, Patient Position: Sitting, Cuff Size: Large)   Pulse 78   Temp 97.6 F (36.4 C) (Temporal)   Ht '6\' 3"'$  (1.905 m)   Wt 252 lb (114.3 kg)   SpO2 98%   BMI 31.50 kg/m  Wt Readings from Last 3 Encounters:  05/01/22 252 lb (114.3 kg)  03/14/22 250 lb 6.4 oz (113.6 kg)  02/22/22 250 lb (113.4 kg)       Assessment & Plan:   Problem List Items Addressed This Visit       Cardiovascular and Mediastinum   Hypertensive heart disease with heart failure ([place])    Blood pressure close to goal.  Euvolemic.  Continue current medications and follow-up with PCP and cardiology as recommended.        Endocrine   Diabetes mellitus with stage 4 chronic kidney disease ([facility])     Nervous and Auditory   Lumbosacral radiculopathy -  Primary    Approximate 10 to 12-week history of left low back pain predominantly piriformis with  radiculopathy.  Continues having flareups with prolonged sitting.  Visibly uncomfortable.  Completed 6-weeks of PT and will continue PT for now. Continue ice or heat, topical analgesic.  He must avoid NSAIDs due to stage IV renal disease. Short course of Depo-Medrol and Zanaflex prescribed.  Counseling on stopping the steroids if he notices edema, fluid retention, elevated blood pressures.  Counseling on the sedating nature of Zanaflex and to avoid driving or alcohol. Referral to Ortho care for further evaluation and treatment.      Relevant Medications   tiZANidine (ZANAFLEX) 4 MG tablet   methylPREDNISolone (MEDROL DOSEPAK) 4 MG TBPK tablet   Other Relevant Orders   Ambulatory referral to Orthopedic Surgery   Other Visit Diagnoses     Stage 4 chronic kidney disease ([place])           I am having Omeed R. Wilford start on tiZANidine and methylPREDNISolone. I am also having him maintain his aspirin EC, freestyle, glucose blood, Trulicity, Vitamin D (Ergocalciferol), BD Pen Needle Nano U/F, hydrALAZINE, nitroGLYCERIN, atorvastatin, furosemide, carvedilol, Levemir FlexTouch, and Semaglutide (1 MG/DOSE).  Meds ordered this encounter  Medications   tiZANidine (ZANAFLEX) 4 MG tablet    Sig: Take 1 tablet (4 mg total) by mouth every 6 (six) hours as needed for muscle spasms.    Dispense:  30 tablet    Refill:  0    Order Specific Question:   Supervising Provider    Answer:   Pricilla Holm A [[id number]]   methylPREDNISolone (MEDROL DOSEPAK) 4 MG TBPK tablet    Sig: 24 mg PO on day 1, then decr. by 4 mg/day x5 days    Dispense:  21 tablet    Refill:  0    Order Specific Question:   Supervising Provider    Answer:   Pricilla Holm A [[id number]]

## 2022-05-01 NOTE — Patient Instructions (Addendum)
Continue with ice and heat as well as topical pain medicine.  Continue physical therapy.  You may start the steroids by mouth. If you notice any fluid retention (swelling) or elevated blood pressures then you can stop the medication. Take the steroids early in the day and with a meal and full glass of water.   Be aware that steroids will raise your blood sugar so keep a close eye on these as well.   I prescribed a muscle relaxant for you to take but be aware this medication is sedating.  Avoid driving or drinking alcohol with this medication.  I placed a referral for you to see an orthopedist and they will call you to schedule.

## 2022-05-01 NOTE — Assessment & Plan Note (Signed)
Blood pressure close to goal.  Euvolemic.  Continue current medications and follow-up with PCP and cardiology as recommended.

## 2022-05-02 ENCOUNTER — Other Ambulatory Visit: Payer: Self-pay | Admitting: Internal Medicine

## 2022-05-02 ENCOUNTER — Encounter: Payer: Self-pay | Admitting: Physical Therapy

## 2022-05-02 ENCOUNTER — Ambulatory Visit: Payer: 59 | Admitting: Physical Therapy

## 2022-05-02 DIAGNOSIS — R252 Cramp and spasm: Secondary | ICD-10-CM

## 2022-05-02 DIAGNOSIS — R2689 Other abnormalities of gait and mobility: Secondary | ICD-10-CM

## 2022-05-02 DIAGNOSIS — M6281 Muscle weakness (generalized): Secondary | ICD-10-CM

## 2022-05-02 DIAGNOSIS — M5416 Radiculopathy, lumbar region: Secondary | ICD-10-CM | POA: Diagnosis not present

## 2022-05-02 NOTE — Therapy (Signed)
OUTPATIENT PHYSICAL THERAPY TREATMENT NOTE   Patient Name: JAMEAL RAZZANO MRN: [id number] DOB:22-Oct-1970, 51 y.o., male Today's Date: 05/02/2022   PT End of Session - 05/02/22 1038     Visit Number 15    Number of Visits 18    Date for PT Re-Evaluation 05/13/22    Authorization Type [employer]    PT Start Time 1028   patient arrived late   Activity Tolerance Patient tolerated treatment well    Behavior During Therapy [facility] for tasks assessed/performed                     Past Medical History:  Diagnosis Date   CHEST PAIN 08/28/2009   DIABETES MELLITUS, TYPE II 03/20/2007   HYPERCHOLESTEROLEMIA 07/21/2008   HYPERTENSION 03/20/2007   Hypertensive urgency 03/27/2015   Left ventricular dysfunction 03/28/2015   EF 40-45%, no WMA, grade 2 diastolic dysfunction   Renal disorder    Past Surgical History:  Procedure Laterality Date   KNEE ARTHROSCOPY Right    Patient Active Problem List   Diagnosis Date Noted   Lumbosacral radiculopathy 05/01/2022   Aortic atherosclerosis ([place]) 02/25/2022   Acute left-sided low back pain with left-sided sciatica 02/24/2022   GERD (gastroesophageal reflux disease) 10/26/2021   Colon cancer screening 10/26/2021   Bilateral hearing loss 05/10/2021   Eustachian tube dysfunction, left 05/10/2021   Persistent proteinuria 11/16/2019   Hyperkalemia 08/27/2018   Hyperphosphatemia 04/09/2018   Orthostasis 12/16/2017   Chest pain 04/04/2017   Normocytic anemia 04/04/2017   AKI (acute kidney injury) ([place]) 11/14/2016   Routine general medical examination at a health care facility 10/11/2016   Labile hypertension 07/12/2016   Dizziness 11/28/2015   Vitamin D deficiency 11/09/2015   Anemia due to stage 4 chronic kidney disease ([place]) 11/09/2015   Benign hypertension with CKD (chronic kidney disease) stage IV ([place]) 09/29/2015   Diabetic nephropathy ([place]) 03/29/2015   Diabetes mellitus with stage 4 chronic kidney disease ([place]) 03/29/2015   Acute  renal failure ([place])    Dyslipidemia    Hypertensive urgency    Hypertensive heart disease with heart failure ([place]) 01/20/2015   Hypogonadism male 05/27/2012   Essential hypertension 03/20/2007    PCP: Hoyt Koch, MD   REFERRING PROVIDER: Girtha Rm, NP-C  REFERRING DIAG: [phone] (ICD-10-CM) - Acute left-sided low back pain with left-sided sciatica  Rationale for Evaluation and Treatment Rehabilitation  THERAPY DIAG:  Radiculopathy, lumbar region  Muscle weakness (generalized)  Other abnormalities of gait and mobility  Cramp and spasm  ONSET DATE: 2-3 weeks ago  SUBJECTIVE:  SUBJECTIVE STATEMENT: KHOURY SIEMON reports his back is feeling a lot better today.  He saw referring provider yesterday, who prescribed muscle relaxors and prednisone, has not started yet, and referred to orthopedics.    Still performing exercises frequently throughout the day.   PAIN:  Are you having pain? Yes: NPRS scale: 5/10 Pain location: left hip, cramping in leg Pain description: sharpness, shooting, constant cramp Aggravating factors: sitting, driving Relieving factors: lying seat all the way back  PERTINENT HISTORY:  Rt knee scope, HTN, DM, heart failure 2016, kidney disease  PRECAUTIONS: None  WEIGHT BEARING RESTRICTIONS No  FALLS:  Has patient fallen in last 6 months? No  LIVING ENVIRONMENT: Lives with: lives with their family Lives in: House/apartment Stairs: Yes: Internal: 15 steps; can reach both Has following equipment at home: None  OCCUPATION: Charity fundraiser, sits mostly, also musician in church  PLOF: Independent  PATIENT GOALS Fix it.   OBJECTIVE:   DIAGNOSTIC FINDINGS:  XR 02/25/22 Normal anatomic alignment. No evidence for acute fracture or dislocation. Preservation  of the vertebral body heights. L4-5 degenerative disc disease. L5-S1 degenerative disc disease. L4-5 and L5-S1 facet degenerative changes. Extensive aortic atherosclerosis  PATIENT SURVEYS:  Modified Oswestry 48% disability  LEFS 45/80 = 56.3% disabiltiy  04/15/22  SCREENING FOR RED FLAGS: Bowel or bladder incontinence: No Spinal tumors: No Cauda equina syndrome: No Compression fracture: No Abdominal aneurysm: No  COGNITION:  Overall cognitive status: Within functional limits for tasks assessed     SENSATION: WFL  MUSCLE LENGTH: Marked bil HS tightness (left limited by neural tension)  POSTURE: rounded shoulders, decreased lumbar lordosis, flexed trunk , and weight shift right  PALPATION: TTP at let gluteals, lumbar, lateral thigh, gastroc Spinal mobility: decreased UPA L4/5, L5/S1 left   LUMBAR ROM:   Active  AROM  eval 03/25/2022 04/15/22  Flexion 25%* 75% 75%  Extension 50% 75% WFL  Right lateral flexion WNL  WNL  Left lateral flexion WNL  WNL  Right rotation full  Full  Left rotation 90%  Full   (Blank rows = not tested)  LOWER EXTREMITY ROM:     Right LE WFL Left: limited by pain in hip flexion, knee ext/flex, ankle DF, else [facility]  LOWER EXTREMITY MMT:    MMT Right eval Left eval Right 04/15/22 Left 04/15/22  Hip flexion 4+ 4+ 4+ 4+  Hip extension 5 4 5  4+  Hip abduction 5 4*  4+  Hip adduction    5  Hip internal rotation 4+ 4-*  5  Hip external rotation 4+ 4-*  4+  Knee flexion 5 4-* 4+ 4+  Knee extension 5 4*  5  Ankle dorsiflexion 5 4-  4-  Ankle plantarflexion      Ankle inversion 5 4-    Ankle eversion 5 4-     (Blank rows = not tested)*pain  LUMBAR SPECIAL TESTS:  Straight leg raise test: Positive and Slump test: Positive  FUNCTIONAL TESTS:  Prone on elbows decreases pain; PPU 2x10 centralizes pain to left low back/hip   GAIT: Distance walked: 40 Assistive device utilized: None Level of assistance: Complete Independence Comments: left  [email or web address]    TODAY'S TREATMENT  05/02/2022 Mechanical  Lumbar Traction x 20 min including set-up and steps up, 15 min treatment time at 120 lbs max, 105 lbs min, 60 sec on/10 sec rest, 2 steps up, 2 steps down, with table at 20 deg decline, to decrease radicular symptoms and pain.  04/29/2022 Therapeutic Exercise: to improve strength  and mobility.  Demo, verbal and tactile cues throughout for technique. Bike L2x49min Reviewed HEP Mechanical  Lumbar Traction x 20 min including set-up and steps up, 15 min treatment time at 115 lbs max, 100 lbs min, 60 sec on/10 sec rest, 2 steps up, 2 steps down, with table at 20 deg decline, to decrease radicular symptoms and pain.   04/24/22 Therapeutic Exercise: to improve strength and mobility.  Demo, verbal and tactile cues throughout for technique. Bike L2x69min Mechanical  Lumbar Traction x 20 min including set-up and steps up, 15 min treatment time at 110 lbs max, 90 lbs min, 60 sec on/10 sec rest, 2 steps up, 2 steps down, with table at 20 deg decline, to decrease radicular symptoms and pain.  04/22/2022 Therapeutic Exercise: to improve strength and mobility.  Demo, verbal and tactile cues throughout for technique. Bike L3 x 5 min   Mechanical  Lumbar Traction x 20 min including set-up and steps up, 15 min treatment time at 110 lbs max, 90 lbs min, 60 sec on/10 sec rest, 2 steps up, 2 steps down, with table at 20 deg decline, to decrease radicular symptoms and pain.     PATIENT EDUCATION:  Education details: HEP update 03/25/22, 9/25, 9/27, 10/2 Person educated: Patient Education method: Explanation, Demonstration, Verbal cues, and Handouts Education comprehension: verbalized understanding and returned demonstration   HOME EXERCISE PROGRAM: Access Code: [email or web address]  ASSESSMENT: CLINICAL IMPRESSION: Noted improved gait this morning, not as antalgic as last session.  Arrived late so focused on mechanical traction only, increased pull slightly,  tolerated well, reported decreased pain following.  SHERRY ROGUS continues to demonstrate potential for improvement and would benefit from continued skilled therapy to address impairments.       OBJECTIVE IMPAIRMENTS Abnormal gait, decreased activity tolerance, decreased ROM, decreased strength, hypomobility, increased muscle spasms, impaired flexibility, improper body mechanics, and pain.   ACTIVITY LIMITATIONS bending, squatting, sleeping, transfers, and locomotion level  PARTICIPATION LIMITATIONS:  pain with most ADLS  PERSONAL FACTORS Time since onset of injury/illness/exacerbation and 3+ comorbidities: HTN, DM, kidney disease  are also affecting patient's functional outcome.   REHAB POTENTIAL: Excellent  CLINICAL DECISION MAKING: Stable/uncomplicated  EVALUATION COMPLEXITY: Low   GOALS: Goals reviewed with patient? Yes  SHORT TERM GOALS: Target date: 03/18/2022   Patient will be independent with initial HEP.  Baseline:  Goal status: MET  2.  Patient will report regular centralization of radicular symptoms with prone press ups.  Baseline:  Goal status: MET  3.  Pt educated regarding ADL modifications and proper body mechanics Baseline:  Goal status: MET   LONG TERM GOALS: Target date: 04/15/2022 extended to 05/13/22   Patient will be independent with advanced/ongoing HEP to improve outcomes and carryover.  Baseline:  Goal status: IN PROGRESS  2.  Patient will report 75% improvement in low back pain to improve QOL.  Baseline:  Goal status: IN PROGRESS about 70% improvement  3.  Patient to demonstrate ability to achieve and maintain good spinal alignment/posturing and body mechanics needed for daily activities. Baseline:  Goal status: IN PROGRESS 04/15/22  4.  Patient will demonstrate functional pain free lumbar ROM to perform ADLs.   Baseline:  Goal status: IN PROGRESS MET except end range flexion and ext still has pain  5.  Patient will demonstrate  improved functional strength as demonstrated by ability to squat for correct body mechanics and lifting. Baseline:  Goal status: IN PROGRESS - 04/11/22 able to squat with good mechanics  6.  Patient will report <= 36% to demonstrate improved functional ability.  Baseline: 48% eval Goal status: REVISED 04/15/22   7.  Patient will reports improved tolerance of sitting and driving by 31% or more. Baseline:  Goal status: IN PROGRESS- 04/11/22 70%  8.  Patient able to safely amb community distances with a non-antalgic gait pattern Baseline:  Goal status: IN PROGRESS 04/15/22  9.  Patient will report <= 47% disability on LEFS to demonstrate improved functional ability.  Baseline: 56% on 04/15/22 Goal status: IN PROGRESS 04/15/22    PLAN: PT FREQUENCY: 2x/week  PT DURATION: 6 weeks extended additional 4 weeks to 05/13/22  PLANNED INTERVENTIONS: Therapeutic exercises, Therapeutic activity, Neuromuscular re-education, Patient/Family education, Self Care, Joint mobilization, Joint manipulation, Aquatic Therapy, Dry Needling, Electrical stimulation, Spinal manipulation, Spinal mobilization, Cryotherapy, Moist heat, Taping, Traction, Ionotophoresis 4mg /ml Dexamethasone, and Manual therapy.  PLAN FOR NEXT SESSION:   mechanical traction, continue to progress HEP, manual therapy/modalities PRN   Rennie Natter, PT, DPT  05/02/2022, 12:24 PM

## 2022-05-06 ENCOUNTER — Encounter: Payer: Self-pay | Admitting: Orthopedic Surgery

## 2022-05-06 ENCOUNTER — Ambulatory Visit: Payer: 59 | Admitting: Physical Therapy

## 2022-05-06 ENCOUNTER — Ambulatory Visit (INDEPENDENT_AMBULATORY_CARE_PROVIDER_SITE_OTHER): Payer: 59

## 2022-05-06 ENCOUNTER — Encounter: Payer: Self-pay | Admitting: Physical Therapy

## 2022-05-06 ENCOUNTER — Ambulatory Visit (INDEPENDENT_AMBULATORY_CARE_PROVIDER_SITE_OTHER): Payer: 59 | Admitting: Orthopedic Surgery

## 2022-05-06 VITALS — BP 180/84 | HR 75 | Ht 75.0 in | Wt 252.0 lb

## 2022-05-06 DIAGNOSIS — M5417 Radiculopathy, lumbosacral region: Secondary | ICD-10-CM | POA: Diagnosis not present

## 2022-05-06 DIAGNOSIS — M5416 Radiculopathy, lumbar region: Secondary | ICD-10-CM

## 2022-05-06 DIAGNOSIS — R252 Cramp and spasm: Secondary | ICD-10-CM

## 2022-05-06 DIAGNOSIS — R2689 Other abnormalities of gait and mobility: Secondary | ICD-10-CM

## 2022-05-06 DIAGNOSIS — M6281 Muscle weakness (generalized): Secondary | ICD-10-CM

## 2022-05-06 NOTE — Therapy (Addendum)
PHYSICAL THERAPY DISCHARGE SUMMARY  Visits from Start of Care: 16  Current functional level related to goals / functional outcomes: Decreased LBP and improved functional strength   Remaining deficits: Continue to demonstrate weakness in LLE due to radiculopathy   Education / Equipment: HEP  Plan:  Patient goals were not met. Patient is being discharged due to not returning to therapy after 05/06/22 appointment.  He would need new order to return at this time.    Rennie Natter, PT, DPT 4:51 PM 06/18/2022     OUTPATIENT PHYSICAL THERAPY TREATMENT NOTE   Patient Name: Alexander [name] MRN: [id number] DOB:1970/11/01, 51 y.o., male Today's Date: 05/06/2022   PT End of Session - 05/06/22 0906     Visit Number 16    Number of Visits 18    Date for PT Re-Evaluation 05/13/22    Authorization Type [employer]    PT Start Time 0900    Activity Tolerance Patient tolerated treatment well    Behavior During Therapy [facility] for tasks assessed/performed                      Past Medical History:  Diagnosis Date   CHEST PAIN 08/28/2009   DIABETES MELLITUS, TYPE II 03/20/2007   HYPERCHOLESTEROLEMIA 07/21/2008   HYPERTENSION 03/20/2007   Hypertensive urgency 03/27/2015   Left ventricular dysfunction 03/28/2015   EF 40-45%, no WMA, grade 2 diastolic dysfunction   Renal disorder    Past Surgical History:  Procedure Laterality Date   KNEE ARTHROSCOPY Right    Patient Active Problem List   Diagnosis Date Noted   Lumbosacral radiculopathy 05/01/2022   Aortic atherosclerosis ([place]) 02/25/2022   Acute left-sided low back pain with left-sided sciatica 02/24/2022   GERD (gastroesophageal reflux disease) 10/26/2021   Colon cancer screening 10/26/2021   Bilateral hearing loss 05/10/2021   Eustachian tube dysfunction, left 05/10/2021   Persistent proteinuria 11/16/2019   Hyperkalemia 08/27/2018   Hyperphosphatemia 04/09/2018   Orthostasis 12/16/2017   Chest pain 04/04/2017    Normocytic anemia 04/04/2017   AKI (acute kidney injury) ([place]) 11/14/2016   Routine general medical examination at a health care facility 10/11/2016   Labile hypertension 07/12/2016   Dizziness 11/28/2015   Vitamin D deficiency 11/09/2015   Anemia due to stage 4 chronic kidney disease ([place]) 11/09/2015   Benign hypertension with CKD (chronic kidney disease) stage IV ([place]) 09/29/2015   Diabetic nephropathy ([place]) 03/29/2015   Diabetes mellitus with stage 4 chronic kidney disease ([place]) 03/29/2015   Acute renal failure ([place])    Dyslipidemia    Hypertensive urgency    Hypertensive heart disease with heart failure ([place]) 01/20/2015   Hypogonadism male 05/27/2012   Essential hypertension 03/20/2007    PCP: Hoyt Koch, MD   REFERRING PROVIDER: Girtha Rm, NP-C  REFERRING DIAG: [phone] (ICD-10-CM) - Acute left-sided low back pain with left-sided sciatica  Rationale for Evaluation and Treatment Rehabilitation  THERAPY DIAG:  Radiculopathy, lumbar region  Muscle weakness (generalized)  Other abnormalities of gait and mobility  Cramp and spasm  ONSET DATE: 2-3 weeks ago  SUBJECTIVE:  SUBJECTIVE STATEMENT: Alexander [name] reports the prednisone is helping, but having to monitor for blood sugar, blood pressure and swelling.  Seeing orthopedist about back this afternoon.  Mostly just having cramping in left lower leg now.   PAIN:  Are you having pain? Yes: NPRS scale: 4/10 Pain location: left hip, cramping in leg Pain description: sharpness, shooting, constant cramp Aggravating factors: sitting, driving Relieving factors: lying seat all the way back  PERTINENT HISTORY:  Rt knee scope, HTN, DM, heart failure 2016, kidney disease  PRECAUTIONS: None  WEIGHT BEARING RESTRICTIONS  No  FALLS:  Has patient fallen in last 6 months? No  LIVING ENVIRONMENT: Lives with: lives with their family Lives in: House/apartment Stairs: Yes: Internal: 15 steps; can reach both Has following equipment at home: None  OCCUPATION: English as a second language teacher, sits mostly, also musician in church  PLOF: [place] it.   OBJECTIVE:   DIAGNOSTIC FINDINGS:  XR 02/25/22 Normal anatomic alignment. No evidence for acute fracture or dislocation. Preservation of the vertebral body heights. L4-5 degenerative disc disease. L5-S1 degenerative disc disease. L4-5 and L5-S1 facet degenerative changes. Extensive aortic atherosclerosis  PATIENT SURVEYS:  Modified Oswestry 48% disability  LEFS 45/80 = 56.3% disabiltiy  04/15/22  SCREENING FOR RED FLAGS: Bowel or bladder incontinence: No Spinal tumors: No Cauda equina syndrome: No Compression fracture: No Abdominal aneurysm: No  COGNITION:  Overall cognitive status: Within functional limits for tasks assessed     SENSATION: WFL  MUSCLE LENGTH: Marked bil HS tightness (left limited by neural tension)  POSTURE: rounded shoulders, decreased lumbar lordosis, flexed trunk , and weight shift right  PALPATION: TTP at let gluteals, lumbar, lateral thigh, gastroc Spinal mobility: decreased UPA L4/5, L5/S1 left   LUMBAR ROM:   Active  AROM  eval 03/25/2022 04/15/22  Flexion 25%* 75% 75%  Extension 50% 75% WFL  Right lateral flexion WNL  WNL  Left lateral flexion WNL  WNL  Right rotation full  Full  Left rotation 90%  Full   (Blank rows = not tested)  LOWER EXTREMITY ROM:     Right LE WFL Left: limited by pain in hip flexion, knee ext/flex, ankle DF, else [facility]  LOWER EXTREMITY MMT:    MMT Right eval Left eval Right 04/15/22 Left 04/15/22  Hip flexion 4+ 4+ 4+ 4+  Hip extension _0 4+  Hip abduction 5 4*  4+  Hip adduction    5  Hip internal rotation 4+ 4-*  5  Hip external rotation 4+ 4-*  4+  Knee flexion 5 4-* 4+  4+  Knee extension 5 4*  5  Ankle dorsiflexion 5 4-  4-  Ankle plantarflexion      Ankle inversion 5 4-    Ankle eversion 5 4-     (Blank rows = not tested)*pain  LUMBAR SPECIAL TESTS:  Straight leg raise test: Positive and Slump test: Positive  FUNCTIONAL TESTS:  Prone on elbows decreases pain; PPU 2x10 centralizes pain to left low back/hip   GAIT: Distance walked: 40 Assistive device utilized: None Level of assistance: Complete Independence Comments: left trendelenberg    TODAY'S TREATMENT  05/06/2022 Mechanical  Lumbar Traction x 20 min including set-up and steps up, 15 min treatment time at 120 lbs max, 105 lbs min, 60 sec on/10 sec rest, 2 steps up, 2 steps down, with table at 10 deg decline, to decrease radicular symptoms and pain.  05/02/2022 Mechanical  Lumbar Traction x 20 min including set-up and steps  up, 15 min treatment time at 120 lbs max, 105 lbs min, 60 sec on/10 sec rest, 2 steps up, 2 steps down, with table at 20 deg decline, to decrease radicular symptoms and pain.  04/29/2022 Therapeutic Exercise: to improve strength and mobility.  Demo, verbal and tactile cues throughout for technique. Bike [id number] Reviewed HEP Mechanical  Lumbar Traction x 20 min including set-up and steps up, 15 min treatment time at 115 lbs max, 100 lbs min, 60 sec on/10 sec rest, 2 steps up, 2 steps down, with table at 20 deg decline, to decrease radicular symptoms and pain.    PATIENT EDUCATION:  Education details: HEP update 03/25/22, 9/25, 9/27, 10/2 Person educated: Patient Education method: Explanation, Demonstration, Verbal cues, and Handouts Education comprehension: verbalized understanding and returned demonstration   HOME EXERCISE PROGRAM: Access Code: [email or web address]  ASSESSMENT: CLINICAL IMPRESSION:  Arrived late again so only mechanical traction performed.  Raised table height for more extension.  He reports good tolerance and improving pain.  Alexander MORAIScontinues to  demonstrate potential for improvement and would benefit from continued skilled therapy to address impairments.       OBJECTIVE IMPAIRMENTS Abnormal gait, decreased activity tolerance, decreased ROM, decreased strength, hypomobility, increased muscle spasms, impaired flexibility, improper body mechanics, and pain.   ACTIVITY LIMITATIONS bending, squatting, sleeping, transfers, and locomotion level  PARTICIPATION LIMITATIONS:  pain with most ADLS  PERSONAL FACTORS Time since onset of injury/illness/exacerbation and 3+ comorbidities: HTN, DM, kidney disease  are also affecting patient's functional outcome.   REHAB POTENTIAL: Excellent  CLINICAL DECISION MAKING: Stable/uncomplicated  EVALUATION COMPLEXITY: Low   GOALS: Goals reviewed with patient? Yes  SHORT TERM GOALS: Target date: 03/18/2022   Patient will be independent with initial HEP.  Baseline:  Goal status: MET  2.  Patient will report regular centralization of radicular symptoms with prone press ups.  Baseline:  Goal status: MET  3.  Pt educated regarding ADL modifications and proper body mechanics Baseline:  Goal status: MET   LONG TERM GOALS: Target date: 04/15/2022 extended to 05/13/22   Patient will be independent with advanced/ongoing HEP to improve outcomes and carryover.  Baseline:  Goal status: IN PROGRESS  2.  Patient will report 75% improvement in low back pain to improve QOL.  Baseline:  Goal status: IN PROGRESS about 70% improvement  3.  Patient to demonstrate ability to achieve and maintain good spinal alignment/posturing and body mechanics needed for daily activities. Baseline:  Goal status: IN PROGRESS 04/15/22  4.  Patient will demonstrate functional pain free lumbar ROM to perform ADLs.   Baseline:  Goal status: IN PROGRESS MET except end range flexion and ext still has pain  5.  Patient will demonstrate improved functional strength as demonstrated by ability to squat for correct body  mechanics and lifting. Baseline:  Goal status: IN PROGRESS - 04/11/22 able to squat with good mechanics  6.  Patient will report <= 36% to demonstrate improved functional ability.  Baseline: 48% eval Goal status: REVISED 04/15/22   7.  Patient will reports improved tolerance of sitting and driving by 785%or more. Baseline:  Goal status: IN PROGRESS- 04/11/22 70%  8.  Patient able to safely amb community distances with a non-antalgic gait pattern Baseline:  Goal status: IN PROGRESS 04/15/22  9.  Patient will report <= 47% disability on LEFS to demonstrate improved functional ability.  Baseline: 56% on 04/15/22 Goal status: IN PROGRESS 04/15/22    PLAN: PT FREQUENCY: 2x/week  PT DURATION:  6 weeks extended additional 4 weeks to 05/13/22  PLANNED INTERVENTIONS: Therapeutic exercises, Therapeutic activity, Neuromuscular re-education, Patient/Family education, Self Care, Joint mobilization, Joint manipulation, Aquatic Therapy, Dry Needling, Electrical stimulation, Spinal manipulation, Spinal mobilization, Cryotherapy, Moist heat, Taping, Traction, Ionotophoresis 4m/ml Dexamethasone, and Manual therapy.  PLAN FOR NEXT SESSION:   mechanical traction, continue to progress HEP, manual therapy/modalities PRN   ERennie Natter PT, DPT  05/06/2022, 9:33 AM

## 2022-05-06 NOTE — Progress Notes (Signed)
Orthopedic Spine Surgery Office Note  Assessment: Patient is a 51 y.o. male with low back pain with radicular left leg component.  Has weakness with dorsiflexion and plantarflexion, so suspect lower lumbar radiculopathy   Plan: -Explained that initially conservative treatment is tried as a significant number of patients may experience relief with these treatment modalities. Discussed that the conservative treatments include:  -activity modification  -physical therapy  -over the counter pain medications (unable to do NSAIDs due to chronic kidney disease)  -medrol dosepak  -lumbar steroid injections -Patient has tried 8 weeks of physical therapy, Medrol Dosepak, muscle relaxer, Tylenol -Recommended MRI of the lumbar spine to evaluate for radiculopathy -Patient should return to office in 3 weeks, repeat x-rays of lumbar spine at next visit: None   Patient expressed understanding of the plan and all questions were answered to the patient's satisfaction.   ___________________________________________________________________________   History:  Patient is a 51 y.o. male who presents today for lumbar spine.  Patient has had acute onset of low back and left leg radicular symptoms.  Started a little over 2 months ago.  There is no trauma or injury that brought on the pain.  He feels it starts in his low back and radiates down his left thigh along the posterior aspect into the posterior lateral leg, and into the plantar and lateral aspect of the foot.  He has no symptoms on the right side.  He has a history of ruptured disc many years ago but those symptoms resolved on their own.  He has noticed weakness in his left foot.  Feels like he is having trouble walking due to this weakness.  Pain gets worse when he changes position.  It improves if he is sitting down slightly reclined.   Weakness: Yes, left foot weakness.  No other weakness Symptoms of imbalance: Denies Paresthesias and numbness:  Denies Bowel or bladder incontinence: Denies Saddle anesthesia: Denies  Treatments tried: Oral steroid, Tylenol, physical therapy, activity modification  Review of systems: Denies fevers and chills, night sweats, unexplained weight loss, history of cancer.  Has had pain wake him at night  Past medical history: Hyperlipidemia Hypertension Heart failure GERD Diabetes (A1C - 7.5 on 03/14/2022) Chronic kidney disease  Allergies: NKDA  Past surgical history:  Meniscus surgery  Social history: Denies use of nicotine product (smoking, vaping, patches, smokeless) Alcohol use: Denies Denies recreational drug use   Physical Exam:  General: no acute distress, appears stated age Neurologic: alert, answering questions appropriately, following commands Respiratory: unlabored breathing on room air, symmetric chest rise Psychiatric: appropriate affect, normal cadence to speech   MSK (spine):  -Strength exam      Left  Right EHL    4/5  5/5 TA    4/5  5/5 GSC    4+/5  5/5 Knee extension  5/5  5/5 Hip flexion   5/5  5/5  -Sensory exam    Sensation intact to light touch in L3-S1 nerve distributions of bilateral lower extremities  -Achilles DTR: 2/4 on the left, 2/4 on the right -Patellar tendon DTR: 1/4 on the left, 1/4 on the right  -Straight leg raise: positive -Contralateral straight leg raise: negative -Clonus: no beats bilaterally  -Left hip exam: No pain to range of motion, negative Stinchfield -Right hip exam: No pain through range of motion, negative Stinchfield  Imaging: XR of the lumbar spine from 02/22/2022 and 05/06/2022 was independently reviewed and interpreted, showing disc height loss at L4/5. No fracture or dislocation. No evidence  of instability on flexion/extension.    Patient name: Alexander [name] Patient MRN: [id number] Date of visit: 05/06/22

## 2022-05-09 ENCOUNTER — Ambulatory Visit: Payer: 59

## 2022-05-13 ENCOUNTER — Encounter: Payer: Self-pay | Admitting: Orthopedic Surgery

## 2022-05-20 ENCOUNTER — Ambulatory Visit
Admission: RE | Admit: 2022-05-20 | Discharge: 2022-05-20 | Disposition: A | Discharge status: Home / Self Care | Payer: 59 | Source: Ambulatory Visit | Attending: Orthopedic Surgery | Admitting: Orthopedic Surgery

## 2022-05-20 DIAGNOSIS — M5417 Radiculopathy, lumbosacral region: Secondary | ICD-10-CM

## 2022-05-29 ENCOUNTER — Ambulatory Visit: Payer: 59 | Admitting: Orthopedic Surgery

## 2022-05-29 ENCOUNTER — Other Ambulatory Visit: Payer: Self-pay | Admitting: Physical Medicine and Rehabilitation

## 2022-05-29 DIAGNOSIS — M5416 Radiculopathy, lumbar region: Secondary | ICD-10-CM

## 2022-05-29 NOTE — Progress Notes (Signed)
Orthopedic Spine Surgery Office Note  Assessment: Patient is a 51 y.o. male with lumbar radiculopathy. Pain is in L5 and S1 distributions on the left. Has weakness with dorsiflexion and plantarflexion of the left ankle. Disc herniations at L4/5 and L5/S1.   Plan: -Explained that initially conservative treatment is tried as a significant number of patients may experience relief with these treatment modalities. Discussed that the conservative treatments include:  -activity modification  -physical therapy  -over the counter pain medications  -medrol dosepak  -lumbar steroid injections -Patient as tried oral steroid, Tylenol, physical therapy, activity modification  -He has tried conservative treatments for over 6 weeks now, but has not tried an injection. Discussed surgery versus injection. Explained that since his neurologic weakness has not progressed, he could try an injection if he would like. If he were to develop more weakness, would recommend surgical intervention. After discussion this, he wanted to proceed with injection. A referral was provided for a left sided L4/5 and L5/S1 transforaminal injection.  -Patient should return to office in 6 weeks, repeat x-rays of lumbar spine at next visit: none   Patient expressed understanding of the plan and all questions were answered to the patient's satisfaction.   ___________________________________________________________________________  History: Patient is a 51 y.o. male who has been previously seen in the office for symptoms consistent with lumbar radiculopathy. Since the last visit, symptom intensity has remained the same. Still feels pain radiating into his left leg. Feels it in the posterior and anterolateral leg. Goes into the foot. Less consistently has pain in the left thigh. No symptoms on the right side. Does not feel his weakness has become worse. No bowel or bladder incontinence. No numbness or paresthesias. Still has difficulty with  walking due to the weakness, but no changes since the last time he was seen.   Previous treatments: Oral steroid, Tylenol, physical therapy, activity modification   COPY OF OLD NOTE Patient is a 51 y.o. male who presents today for lumbar spine.  Patient has had acute onset of low back and left leg radicular symptoms.  Started a little over 2 months ago.  There is no trauma or injury that brought on the pain.  He feels it starts in his low back and radiates down his left thigh along the posterior aspect into the posterior lateral leg, and into the plantar and lateral aspect of the foot.  He has no symptoms on the right side.  He has a history of ruptured disc many years ago but those symptoms resolved on their own.  He has noticed weakness in his left foot.  Feels like he is having trouble walking due to this weakness.  Pain gets worse when he changes position.  It improves if he is sitting down slightly reclined.     Weakness: Yes, left foot weakness.  No other weakness Symptoms of imbalance: Denies Paresthesias and numbness: Denies Bowel or bladder incontinence: Denies Saddle anesthesia: Denies END OF COPY  Physical Exam:  General: no acute distress, appears stated age Neurologic: alert, answering questions appropriately, following commands Respiratory: unlabored breathing on room air, symmetric chest rise Psychiatric: appropriate affect, normal cadence to speech   MSK (spine):  -Strength exam      Left  Right EHL    4/5  5/5 TA    4/5  5/5 GSC    4+/5  5/5 Knee extension  5/5  5/5 Hip flexion   5/5  5/5  -Sensory exam    Sensation intact to  light touch in L3-S1 nerve distributions of bilateral lower extremities  -Achilles DTR: 2/4 on the left, 2/4 on the right -Patellar tendon DTR: 2/4 on the left, 2/4 on the right  -Straight leg raise: positive -Contralateral straight leg raise: negative -Clonus: no beats bilaterally  Imaging: XR of the lumbar spine from 02/22/2022 and  05/06/2022 was previously independently reviewed and interpreted, showing disc height loss at L4/5. No fracture or dislocation. No evidence of instability on flexion/extension.    MRI of the lumbar spine from 05/20/2022 was independently reviewed and interpreted, showing DDD at L4/5 and L5/S1. Disc herniations at L4/5 and L5/S1. The disc herniation at L4/5 is broad based but is larger on the left side and migrates caudally on that side. The L5/S1 disc herniation is broad based.    Patient name: Alexander [name] Patient MRN: [id number] Date of visit: 05/29/22

## 2022-06-03 ENCOUNTER — Ambulatory Visit: Payer: 59 | Admitting: Orthopedic Surgery

## 2022-06-06 ENCOUNTER — Ambulatory Visit: Payer: 59 | Admitting: Internal Medicine

## 2022-06-06 ENCOUNTER — Encounter: Payer: Self-pay | Admitting: Internal Medicine

## 2022-06-06 VITALS — BP 140/100 | HR 92 | Temp 98.2°F | Ht 75.0 in | Wt 252.0 lb

## 2022-06-06 DIAGNOSIS — D631 Anemia in chronic kidney disease: Secondary | ICD-10-CM

## 2022-06-06 DIAGNOSIS — R0989 Other specified symptoms and signs involving the circulatory and respiratory systems: Secondary | ICD-10-CM | POA: Diagnosis not present

## 2022-06-06 DIAGNOSIS — E1169 Type 2 diabetes mellitus with other specified complication: Secondary | ICD-10-CM | POA: Diagnosis not present

## 2022-06-06 DIAGNOSIS — I700 Atherosclerosis of aorta: Secondary | ICD-10-CM

## 2022-06-06 DIAGNOSIS — E1122 Type 2 diabetes mellitus with diabetic chronic kidney disease: Secondary | ICD-10-CM

## 2022-06-06 DIAGNOSIS — N184 Chronic kidney disease, stage 4 (severe): Secondary | ICD-10-CM | POA: Diagnosis not present

## 2022-06-06 DIAGNOSIS — E785 Hyperlipidemia, unspecified: Secondary | ICD-10-CM

## 2022-06-06 LAB — RENAL FUNCTION PANEL
Albumin: 4 g/dL (ref 3.5–5.2)
BUN: 33 mg/dL — ABNORMAL HIGH (ref 6–23)
CO2: 28 mEq/L (ref 19–32)
Calcium: 8.8 mg/dL (ref 8.4–10.5)
Chloride: 100 mEq/L (ref 96–112)
Creatinine, Ser: 2.69 mg/dL — ABNORMAL HIGH (ref 0.40–1.50)
GFR: 26.77 mL/min — ABNORMAL LOW (ref >60.00)
Glucose, Bld: 161 mg/dL — ABNORMAL HIGH (ref 70–99)
Phosphorus: 4.5 mg/dL (ref 2.3–4.6)
Potassium: 3.9 mEq/L (ref 3.5–5.1)
Sodium: 136 mEq/L (ref 135–145)

## 2022-06-06 NOTE — Patient Instructions (Signed)
We will

## 2022-06-06 NOTE — Progress Notes (Signed)
   Subjective:   Patient ID: Alexander [name], male    DOB: [birth year], 51 y.o.   MRN: [id number]  HPI The patient is a 51 YO man coming in for follow up (not seen since 2018 recently came back to see provider at the office).   PMH, [facility], social history reviewed and updated  Review of Systems  Constitutional: Negative.   HENT: Negative.    Eyes: Negative.   Respiratory:  Negative for cough, chest tightness and shortness of breath.   Cardiovascular:  Negative for chest pain, palpitations and leg swelling.  Gastrointestinal:  Negative for abdominal distention, abdominal pain, constipation, diarrhea, nausea and vomiting.  Musculoskeletal: Negative.   Skin: Negative.   Neurological: Negative.   Psychiatric/Behavioral: Negative.      Objective:  Physical Exam Constitutional:      Appearance: He is well-developed.  HENT:     Head: Normocephalic and atraumatic.  Cardiovascular:     Rate and Rhythm: Normal rate and regular rhythm.  Pulmonary:     Effort: Pulmonary effort is normal. No respiratory distress.     Breath sounds: Normal breath sounds. No wheezing or rales.  Abdominal:     General: Bowel sounds are normal. There is no distension.     Palpations: Abdomen is soft.     Tenderness: There is no abdominal tenderness. There is no rebound.  Musculoskeletal:     Cervical back: Normal range of motion.  Skin:    General: Skin is warm and dry.  Neurological:     Mental Status: He is alert and oriented to person, place, and time.     Coordination: Coordination normal.     Vitals:   06/06/22 0840 06/06/22 0843  BP: (!) 142/100 (!) 140/100  Pulse: 92   Temp: 98.2 F (36.8 C)   TempSrc: Oral   SpO2: 96%   Weight: 252 lb (114.3 kg)   Height: '6\' 3"'$  (1.905 m)     Assessment & Plan:  Visit time 25 minutes in face to face communication with patient and coordination of care, additional 15 minutes spent in record review, coordination or care, ordering tests,  communicating/referring to other healthcare professionals, documenting in medical records all on the same day of the visit for total time 40 minutes spent on the visit.

## 2022-06-07 NOTE — Assessment & Plan Note (Signed)
Recent cholesterol numbers were borderline. Continue lipitor 40 mg daily for now and adjust as needed with next lipid panel.

## 2022-06-07 NOTE — Assessment & Plan Note (Signed)
Overall stable CBC recently will not repeat today.

## 2022-06-07 NOTE — Assessment & Plan Note (Signed)
Seeing endo and with good control last HgA1c 7.5. Given declining renal function will need close monitoring.

## 2022-06-07 NOTE — Assessment & Plan Note (Signed)
Checking renal function panel today. Continue to follow with nephrology. Overall stable function in the last year or so. BP is moderately elevated today and counseled that this does cause renal damage over time.

## 2022-06-07 NOTE — Assessment & Plan Note (Signed)
BP is elevated today and he has taken medications. He thinks it is related to severe pain in his back he has been dealing with. Encouraged home readings and let us know. Given his CKD stage 4 goal <120/80. Contknue coreg 12.5 mg BID and lasix 20 mg daily prn and hydralazine 50 mg BID. He also recently finished oral steroids and steroid shot in his back which can elevate BP. He did not want change in regimen today. Checking renal function for stability.

## 2022-06-07 NOTE — Assessment & Plan Note (Signed)
Taking lipitor 40 mg daily and aspirin 81 mg daily and will continue.

## 2022-06-18 ENCOUNTER — Ambulatory Visit: Payer: 59 | Admitting: Internal Medicine

## 2022-06-18 ENCOUNTER — Encounter: Payer: Self-pay | Admitting: Internal Medicine

## 2022-06-18 VITALS — BP 142/92 | HR 79 | Ht 75.0 in | Wt 252.4 lb

## 2022-06-18 DIAGNOSIS — E785 Hyperlipidemia, unspecified: Secondary | ICD-10-CM | POA: Diagnosis not present

## 2022-06-18 DIAGNOSIS — Z794 Long term (current) use of insulin: Secondary | ICD-10-CM | POA: Diagnosis not present

## 2022-06-18 DIAGNOSIS — N184 Chronic kidney disease, stage 4 (severe): Secondary | ICD-10-CM

## 2022-06-18 DIAGNOSIS — E1122 Type 2 diabetes mellitus with diabetic chronic kidney disease: Secondary | ICD-10-CM | POA: Diagnosis not present

## 2022-06-18 LAB — POCT GLYCOSYLATED HEMOGLOBIN (HGB A1C): Hemoglobin A1C: 8.6 % — AB (ref 4.0–5.6)

## 2022-06-18 MED ORDER — SEMAGLUTIDE (1 MG/DOSE) 4 MG/3ML ~~LOC~~ SOPN: 1.0000 mg | PEN_INJECTOR | SUBCUTANEOUS | 3 refills | Status: DC

## 2022-06-18 MED ORDER — TRESIBA FLEXTOUCH 200 UNIT/ML ~~LOC~~ SOPN: PEN_INJECTOR | SUBCUTANEOUS | 3 refills | Status: DC

## 2022-06-18 MED ORDER — FIASP FLEXTOUCH 100 UNIT/ML ~~LOC~~ SOPN: 8.0000 [IU] | PEN_INJECTOR | Freq: Three times a day (TID) | SUBCUTANEOUS | 5 refills | Status: DC

## 2022-06-18 NOTE — Patient Instructions (Addendum)
Please try again to switch to: - Ozempic 1 mg weekly   Switch to: - Tresiba U200 60 units at bedtime  Add: - FiAsp 8-10 units before the 3 meals - while on steroids  Please discuss with nephrology if we can use: [place] or Jardiance.  Please return in 3 months.

## 2022-06-18 NOTE — Progress Notes (Signed)
Patient ID: Alexander [name], male   DOB: 09-09-1970, 51 y.o.   MRN: [id number]  HPI: Alexander [name] is a 51 y.o.-year-old male, returning for follow-up for DM2, dx in 2004, insulin-dependent since 2013, uncontrolled, with complications (CHF, CKD, PN, DR). Pt. previously saw Dr. Loanne Drilling, last visit with me 3 months ago.  Interim history: No increased urination, blurry vision, chest pain. He does describe acid reflux, nausea, occasional diarrhea. He had a herniated disk >> had Prednisone and will get steroid injection tomorrow. BP and sugars have been higher, but they improved in the last 1 to 2 weeks..  Reviewed HbA1c: Lab Results  Component Value Date   HGBA1C 7.5 (A) 03/14/2022   HGBA1C 7.9 (A) 09/05/2021   HGBA1C 7.2 (A) 06/06/2021   HGBA1C 7.2 (A) 02/20/2021   HGBA1C 6.6 (A) 03/08/2020   HGBA1C 7.9 (A) 12/07/2019   HGBA1C 7.9 (A) 09/07/2019   HGBA1C 6.8 (A) 06/02/2019   HGBA1C 8.3 (A) 11/24/2018   HGBA1C 7.4 (A) 07/20/2018   Pt is on a regimen of: - Trulicity 4.5 mg weekly >>  - could not obtain it from the pharmacy - Levemir 28 >> 36 >> 60 units in am >> ran out 2 days ago Prev. On Metformin.  Pt checks his sugars 1-2x a day and they are: - am: 130-180 >> 114-168, 181 - 2h after b'fast: n/c - before lunch: n/c >> 132 - 2h after lunch: n/c - before dinner: n/c - 2h after dinner: n/c - bedtime: 130-180 >> 151-203, 228 - nighttime: n/c Lowest sugar was 80 >> 130 >> 89; he has hypoglycemia awareness at 80.  Highest sugar was 240 >> 185 >> 587.  Glucometer: Freestyle lite  Pt's meals are: - Breakfast: protein shake - Lunch: largest meal: chicken tenders or salad - Dinner: pasta, chicken fried or baked - Snacks: multiple - fruit He has an air fryer.  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, last BUN/creatinine:  Lab Results  Component Value Date   BUN 33 (H) 06/06/2022   BUN 40 (H) 11/30/2021   CREATININE 2.69 (H) 06/06/2022   CREATININE 2.73 (H) 11/30/2021   He  has proteinuria: Component 11/26/21 05/17/20 11/15/19 06/02/17 11/14/16  U Interval Random Random Random Random Random  U Volume '10 30 3 10 10  '$ U Protein Concentrate 60 '10 31 12 14  '$ U Creatinine Concentrate 202 84 139 139 190  U Protein/G Creatinine 0.00 - 200.00 MG/G CREATININE 297.03 High  119.05 223.02 High  86.33 73.68   -+ HL; last set of lipids: Lab Results  Component Value Date   CHOL 149 11/30/2021   HDL 45.50 11/30/2021   LDLCALC 87 11/30/2021   LDLDIRECT 164.0 07/15/2014   TRIG 80.0 11/30/2021   CHOLHDL 3 11/30/2021  On Lipitor 40 mg daily.  - last eye exam was on 07/18/2021. No DR reportedly.   - + numbness and tingling in his feet (L>R - 2/2 sciatica).  Last foot exam 03/14/2022.  Patient also has a history of HTN.  ROS: + see HPI  Past Medical History:  Diagnosis Date   CHEST PAIN 08/28/2009   DIABETES MELLITUS, TYPE II 03/20/2007   HYPERCHOLESTEROLEMIA 07/21/2008   HYPERTENSION 03/20/2007   Hypertensive urgency 03/27/2015   Left ventricular dysfunction 03/28/2015   EF 40-45%, no WMA, grade 2 diastolic dysfunction   Renal disorder    Past Surgical History:  Procedure Laterality Date   KNEE ARTHROSCOPY Right    Social History   Socioeconomic  History   Marital status: Married    Spouse name: Not on file   Number of children: Not on file   Years of education: Not on file   Highest education level: Not on file  Occupational History   Occupation: Training and development officer: [employer]  Tobacco Use   Smoking status: Never   Smokeless tobacco: Never  Vaping Use   Vaping Use: Never used  Substance and Sexual Activity   Alcohol use: No   Drug use: No   Sexual activity: Not on file  Other Topics Concern   Not on file  Social History Narrative   Not on file   Social Determinants of Health   Financial Resource Strain: Not on file  Food Insecurity: Not on file  Transportation Needs: Not on file  Physical Activity: Not on file  Stress: Not on file  Social  Connections: Not on file  Intimate Partner Violence: Not on file   Current Outpatient Medications on File Prior to Visit  Medication Sig Dispense Refill   aspirin EC 81 MG EC tablet Take 1 tablet (81 mg total) by mouth daily.     atorvastatin (LIPITOR) 40 MG tablet Take 1 tablet (40 mg total) by mouth daily. 90 tablet 2   BD PEN NEEDLE NANO U/F 32G X 4 MM MISC USE DAILY AS DIRECTED 100 each 3   carvedilol (COREG) 12.5 MG tablet TAKE 1 TABLET (12.'5MG'$  TOTAL) BY MOUTH TWICE A DAY WITH MEALS 180 tablet 1   Dulaglutide (TRULICITY) 4.5 LD/3.5TS SOPN Inject 4.5 mg as directed once a week. 6 mL 3   furosemide (LASIX) 20 MG tablet TAKE 1 TABLET (20 MG TOTAL) BY MOUTH DAILY AS NEEDED (FOR SHORTNESS OF BREATH AND SWELLING.). 30 tablet 0   glucose blood (FREESTYLE LITE) test strip 1 each by Other route 2 (two) times daily. And lancets 2/day 200 each 3   hydrALAZINE (APRESOLINE) 50 MG tablet Take 1 tablet by mouth 2 (two) times daily.     insulin detemir (LEVEMIR FLEXTOUCH) 100 UNIT/ML FlexPen Inject 34 Units into the skin at bedtime. And pen needles 1/day 30 mL 3   Lancets (FREESTYLE) lancets Use to monitor glucose levels BID; E11.22 100 each 12   methylPREDNISolone (MEDROL DOSEPAK) 4 MG TBPK tablet 24 mg PO on day 1, then decr. by 4 mg/day x5 days 21 tablet 0   nitroGLYCERIN (NITROSTAT) 0.4 MG SL tablet Place 1 tablet (0.4 mg total) under the tongue every 5 (five) minutes x 3 doses as needed for chest pain. Don't take within 24 hrs of Cialis 25 tablet 0   Semaglutide, 1 MG/DOSE, 4 MG/3ML SOPN Inject 1 mg as directed once a week. 9 mL 3   tiZANidine (ZANAFLEX) 4 MG tablet Take 1 tablet (4 mg total) by mouth every 6 (six) hours as needed for muscle spasms. 30 tablet 0   Vitamin D, Ergocalciferol, (DRISDOL) 1.25 MG (50000 UNIT) CAPS capsule Take 1 capsule (50,000 Units total) by mouth every Monday. 5 capsule 2   No current facility-administered medications on file prior to visit.   No Known  Allergies Family History  Problem Relation Age of Onset   Diabetes Mother    Diabetes Father    Hypertension Father    Diabetes Maternal Grandfather    Diabetes Paternal Grandfather    Diabetes Brother    Hypertension Brother    PE: There were no vitals taken for this visit. Wt Readings from Last 3 Encounters:  06/06/22 252 lb (  114.3 kg)  05/06/22 252 lb (114.3 kg)  05/01/22 252 lb (114.3 kg)   Constitutional: overweight, in NAD Eyes:  EOMI, no exophthalmos ENT: no neck masses, no cervical lymphadenopathy Cardiovascular: RRR, No MRG Respiratory: CTA B Musculoskeletal: no deformities Skin:no rashes Neurological: no tremor with outstretched hands  ASSESSMENT: 1. 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 HL  PLAN:  1. Patient with longstanding, uncontrolled, type 2 diabetes, on injectable antidiabetic regimen with weekly GLP-1 receptor agonist (changed from Trulicity to Ozempic at last visit) and long-acting insulin, with still poor control at last visit, when HbA1c returned better, but still above target, at 7.5%.  At that time, sugars were above goal in the morning but at goal at bedtime.  He was trying to stop eating anything after 9 PM.  We discussed that he ideally would stop eating earlier, but he was going to bed late.  I advised him to start improving his diet.  He was eating fried foods we discussed about stopping this.  Also, I advised him against eating high glycemic index snacks.  We also moved his Levemir from the morning to night to further improve his morning blood sugars.  We also discussed that if he was not able to switch to Trulicity to Ozempic to increase the Levemir dose slightly. Ultimately, due to his decreased ejection fraction and his CKD, we discussed about possibly starting an SGLT2 inhibitor.  However, I advised him to check with his nephrologist whether this would be acceptable, for  his decreased kidney function.  He forgot to do so.  I again advised him to check with nephrology and we can start this at next visit if agreeable. -At today's visit, he returns after a difficult period of time which he had back pain with left leg weakness.  He had prednisone therapy and will have an injection tomorrow.  The sugars have been as high as 500s.  We discussed that he can expect very high blood sugars after the injection, also.  Therefore, I suggested mealtime insulin at least for the period on steroids, until day run out of his system.  I advised him how to vary the dose of insulin based on the size and consistency of the meal.  I also advised him to increase the dose if needed, depending on blood sugars after meals. -He was not able to obtain Ozempic from the pharmacy.  He found out that he may obtain it from the mail order pharmacy so today I sent the prescription to [place].  He still has 1 injection of Trulicity left. -He ran out of Levemir 2 days ago.  We discussed that Levemir will not be on the market anymore and I suggested to change to [place].  Prescription sent to the local [facility].  He did increase the dose of long-acting insulin since last visit and for now we will continue this, especially with the holidays and steroids coming up.  Afterwards, I advised him that if the sugars improve, he can start tapering the dose down. - I suggested to:  Patient Instructions  Please try again to switch to: - Ozempic 1 mg weekly   Switch to: - Tresiba U200 60 units at bedtime  Add: - FiAsp 8-10 units before the 3 meals - while on steroids  Please discuss with nephrology if we can use: [place] or [place].  Please return in 3 months.  - we checked his  HbA1c: 8.6% (higher) - advised to check sugars at different times of the day - 2x a day, rotating check times - advised for yearly eye exams >> he is UTD - return to clinic in 3 months  2. HL -Reviewed latest lipid panel from  11/2021: LDL above our target of less than 55, otherwise fractions at goal: Lab Results  Component Value Date   CHOL 149 11/30/2021   HDL 45.50 11/30/2021   LDLCALC 87 11/30/2021   LDLDIRECT 164.0 07/15/2014   TRIG 80.0 11/30/2021   CHOLHDL 3 11/30/2021  -He continues on Lipitor 40 mg daily without side effects  Philemon Kingdom, MD PhD [facility]

## 2022-06-19 ENCOUNTER — Ambulatory Visit: Payer: 59 | Admitting: Physical Medicine and Rehabilitation

## 2022-06-19 ENCOUNTER — Ambulatory Visit: Payer: Self-pay

## 2022-06-19 VITALS — BP 178/99 | HR 85

## 2022-06-19 DIAGNOSIS — M5416 Radiculopathy, lumbar region: Secondary | ICD-10-CM | POA: Diagnosis not present

## 2022-06-19 MED ORDER — METHYLPREDNISOLONE ACETATE 80 MG/ML IJ SUSP
80.0000 mg | Freq: Once | INTRAMUSCULAR | Status: AC
  Administered 2022-06-19: 80 mg

## 2022-06-19 NOTE — Progress Notes (Signed)
Numeric Pain Rating Scale and Functional Assessment Average Pain 0   In the last MONTH (on 0-10 scale) has pain interfered with the following?  1. General activity like being  able to carry out your everyday physical activities such as walking, climbing stairs, carrying groceries, or moving a chair?  Rating(0)   +Driver, -BT, -Dye Allergies.  Lower back pain. Weakness in shin on left leg

## 2022-06-19 NOTE — Patient Instructions (Signed)

## 2022-06-26 NOTE — Procedures (Signed)
Lumbosacral Transforaminal Epidural Steroid Injection - Sub-Pedicular Approach with Fluoroscopic Guidance  Patient: Alexander [name]      Date of Birth: November 05, 1970 MRN: [id number] PCP: Hoyt Koch, MD      Visit Date: 06/19/2022   Universal Protocol:    Date/Time: 06/19/2022  Consent Given By: the patient  Position: PRONE  Additional Comments: Vital signs were monitored before and after the procedure. Patient was prepped and draped in the usual sterile fashion. The correct patient, procedure, and site was verified.   Injection Procedure Details:   Procedure diagnoses: Radiculopathy, lumbar region [M54.16]    Meds Administered:  Meds ordered this encounter  Medications   methylPREDNISolone acetate (DEPO-MEDROL) injection 80 mg    Laterality: Left  Location/Site: L4 and L5  Needle:5.0 in., 22 ga.  Short bevel or Quincke spinal needle  Needle Placement: Transforaminal  Findings:    -Comments: Excellent flow of contrast along the nerve, nerve root and into the epidural space.  Procedure Details: After squaring off the end-plates to get a true AP view, the C-arm was positioned so that an oblique view of the foramen as noted above was visualized. The target area is just inferior to the "nose of the scotty dog" or sub pedicular. The soft tissues overlying this structure were infiltrated with 2-3 ml. of 1% Lidocaine without Epinephrine.  The spinal needle was inserted toward the target using a "trajectory" view along the fluoroscope beam.  Under AP and lateral visualization, the needle was advanced so it did not puncture dura and was located close the 6 O'Clock position of the pedical in AP tracterory. Biplanar projections were used to confirm position. Aspiration was confirmed to be negative for CSF and/or blood. A 1-2 ml. volume of Isovue-250 was injected and flow of contrast was noted at each level. Radiographs were obtained for documentation purposes.   After  attaining the desired flow of contrast documented above, a 0.5 to 1.0 ml test dose of 0.25% Marcaine was injected into each respective transforaminal space.  The patient was observed for 90 seconds post injection.  After no sensory deficits were reported, and normal lower extremity motor function was noted,   the above injectate was administered so that equal amounts of the injectate were placed at each foramen (level) into the transforaminal epidural space.   Additional Comments:  No complications occurred Dressing: 2 x 2 sterile gauze and Band-Aid    Post-procedure details: Patient was observed during the procedure. Post-procedure instructions were reviewed.  Patient left the clinic in stable condition.

## 2022-06-26 NOTE — Progress Notes (Signed)
Alexander [name] - 51 y.o. male MRN [id number]  Date of birth: 01-15-1971  Office Visit Note: Visit Date: 06/19/2022 PCP: Hoyt Koch, MD Referred by: Callie Fielding, MD  Subjective: Chief Complaint  Patient presents with   Lower Back - Pain   HPI:  Alexander [name] is a 51 y.o. male who comes in today at the request of Dr. Ileene Rubens for planned Left L4-5 and L5-S1 Lumbar Transforaminal epidural steroid injection with fluoroscopic guidance.  The patient has failed conservative care including home exercise, medications, time and activity modification.  This injection will be diagnostic and hopefully therapeutic.  Please see requesting physician notes for further details and justification.   ROS Otherwise per HPI.  Assessment & Plan: Visit Diagnoses:    ICD-10-CM   1. Radiculopathy, lumbar region  M54.16 XR C-ARM NO REPORT    Epidural Steroid injection    methylPREDNISolone acetate (DEPO-MEDROL) injection 80 mg      Plan: No additional findings.   Meds & Orders:  Meds ordered this encounter  Medications   methylPREDNISolone acetate (DEPO-MEDROL) injection 80 mg    Orders Placed This Encounter  Procedures   XR C-ARM NO REPORT   Epidural Steroid injection    Follow-up: Return for visit to requesting provider as needed.   Procedures: No procedures performed  Lumbosacral Transforaminal Epidural Steroid Injection - Sub-Pedicular Approach with Fluoroscopic Guidance  Patient: Alexander [name]      Date of Birth: [birth year] MRN: [id number] PCP: Hoyt Koch, MD      Visit Date: 06/19/2022   Universal Protocol:    Date/Time: 06/19/2022  Consent Given By: the patient  Position: PRONE  Additional Comments: Vital signs were monitored before and after the procedure. Patient was prepped and draped in the usual sterile fashion. The correct patient, procedure, and site was verified.   Injection Procedure Details:   Procedure diagnoses:  Radiculopathy, lumbar region [M54.16]    Meds Administered:  Meds ordered this encounter  Medications   methylPREDNISolone acetate (DEPO-MEDROL) injection 80 mg    Laterality: Left  Location/Site: L4 and L5  Needle:5.0 in., 22 ga.  Short bevel or Quincke spinal needle  Needle Placement: Transforaminal  Findings:    -Comments: Excellent flow of contrast along the nerve, nerve root and into the epidural space.  Procedure Details: After squaring off the end-plates to get a true AP view, the C-arm was positioned so that an oblique view of the foramen as noted above was visualized. The target area is just inferior to the "nose of the scotty dog" or sub pedicular. The soft tissues overlying this structure were infiltrated with 2-3 ml. of 1% Lidocaine without Epinephrine.  The spinal needle was inserted toward the target using a "trajectory" view along the fluoroscope beam.  Under AP and lateral visualization, the needle was advanced so it did not puncture dura and was located close the 6 O'Clock position of the pedical in AP tracterory. Biplanar projections were used to confirm position. Aspiration was confirmed to be negative for CSF and/or blood. A 1-2 ml. volume of Isovue-250 was injected and flow of contrast was noted at each level. Radiographs were obtained for documentation purposes.   After attaining the desired flow of contrast documented above, a 0.5 to 1.0 ml test dose of 0.25% Marcaine was injected into each respective transforaminal space.  The patient was observed for 90 seconds post injection.  After no sensory deficits were reported, and normal lower extremity motor function was  noted,   the above injectate was administered so that equal amounts of the injectate were placed at each foramen (level) into the transforaminal epidural space.   Additional Comments:  No complications occurred Dressing: 2 x 2 sterile gauze and Band-Aid    Post-procedure details: Patient was  observed during the procedure. Post-procedure instructions were reviewed.  Patient left the clinic in stable condition.    Clinical History: MRI LUMBAR SPINE WITHOUT CONTRAST   TECHNIQUE: Multiplanar, multisequence MR imaging of the lumbar spine was performed. No intravenous contrast was administered.   COMPARISON:  MRI lumbar spine 03/24/2007   FINDINGS: Segmentation:  5 lumbar vertebra.  Lowest disc space L5-S1   Alignment:  Normal   Vertebrae:  Normal bone marrow.  Negative for fracture or mass   Conus medullaris and cauda equina: Conus extends to the T12 level. Conus and cauda equina appear normal.   Paraspinal and other soft tissues: Negative for paraspinous mass or adenopathy   Disc levels:   L1-2: Negative   L2-3: A negative   L3-4: Shallow central disc protrusion. No significant stenosis or neural impingement   L4-5: Extruded disc fragment on the left with impingement left L5 nerve root is new. Central disc protrusion with seen previously and is unchanged. Mild central canal stenosis.   L5-S1: Shallow central disc protrusion slightly larger. This is eccentric to the right with mild flattening of the right S1 nerve root which is slightly more prominent compared to the prior MRI.   IMPRESSION: 1. Extruded disc fragment on the left at L4-5 with impingement left L5 nerve root. Central disc protrusion L4-5 unchanged. 2. Shallow central disc protrusion L5-S1 eccentric to the right with mild flattening of the right S1 nerve root.     Electronically Signed   By: Franchot Gallo M.D.   On: 05/21/2022 12:52     Objective:  VS:  HT:    WT:   BMI:     BP:(!) 178/99  HR:85bpm  TEMP: ( )  RESP:  Physical Exam Vitals and nursing note reviewed.  Constitutional:      General: He is not in acute distress.    Appearance: Normal appearance. He is not ill-appearing.  HENT:     Head: Normocephalic and atraumatic.     Right Ear: External ear normal.     Left  Ear: External ear normal.     Nose: No congestion.  Eyes:     Extraocular Movements: Extraocular movements intact.  Cardiovascular:     Rate and Rhythm: Normal rate.     Pulses: Normal pulses.  Pulmonary:     Effort: Pulmonary effort is normal. No respiratory distress.  Abdominal:     General: There is no distension.     Palpations: Abdomen is soft.  Musculoskeletal:        General: No tenderness or signs of injury.     Cervical back: Neck supple.     Right lower leg: No edema.     Left lower leg: No edema.     Comments: Patient has good distal strength without clonus.  Skin:    Findings: No erythema or rash.  Neurological:     General: No focal deficit present.     Mental Status: He is alert and oriented to person, place, and time.     Sensory: No sensory deficit.     Motor: No weakness or abnormal muscle tone.     Coordination: Coordination normal.  Psychiatric:  Mood and Affect: Mood normal.        Behavior: Behavior normal.      Imaging: No results found.

## 2022-10-22 ENCOUNTER — Encounter: Payer: Self-pay | Admitting: Internal Medicine

## 2022-10-22 ENCOUNTER — Ambulatory Visit: Payer: 59 | Admitting: Internal Medicine

## 2022-10-22 VITALS — BP 128/80 | HR 75 | Ht 75.0 in | Wt 276.6 lb

## 2022-10-22 DIAGNOSIS — N184 Chronic kidney disease, stage 4 (severe): Secondary | ICD-10-CM | POA: Diagnosis not present

## 2022-10-22 DIAGNOSIS — E785 Hyperlipidemia, unspecified: Secondary | ICD-10-CM

## 2022-10-22 DIAGNOSIS — Z794 Long term (current) use of insulin: Secondary | ICD-10-CM | POA: Diagnosis not present

## 2022-10-22 DIAGNOSIS — E669 Obesity, unspecified: Secondary | ICD-10-CM

## 2022-10-22 DIAGNOSIS — E1122 Type 2 diabetes mellitus with diabetic chronic kidney disease: Secondary | ICD-10-CM | POA: Diagnosis not present

## 2022-10-22 LAB — POCT GLYCOSYLATED HEMOGLOBIN (HGB A1C): Hemoglobin A1C: 8.3 % — AB (ref 4.0–5.6)

## 2022-10-22 MED ORDER — SEMAGLUTIDE(0.25 OR 0.5MG/DOS) 2 MG/3ML ~~LOC~~ SOPN: 0.5000 mg | PEN_INJECTOR | SUBCUTANEOUS | 3 refills | Status: DC

## 2022-10-22 NOTE — Progress Notes (Signed)
Patient ID: Alexander [name], male   DOB: 26-Sep-1970, 52 y.o.   MRN: [id number]  HPI: Alexander [name] is a 52 y.o.-year-old male, returning for follow-up for DM2, dx in 2004, insulin-dependent since 2013, uncontrolled, with complications (CHF, CKD, PN, DR). Pt. previously saw Dr. Everardo [name], last visit with me 4 months ago.  Interim history: No increased urination, blurry vision, chest pain.  He continues to have acid reflux. Around the time of our last visit, he had prednisone and steroid injections for herniated disc in his back.  Sugars  were higher. The back pain is now better.  Reviewed HbA1c: Lab Results  Component Value Date   HGBA1C 8.6 (A) 06/18/2022   HGBA1C 7.5 (A) 03/14/2022   HGBA1C 7.9 (A) 09/05/2021   HGBA1C 7.2 (A) 06/06/2021   HGBA1C 7.2 (A) 02/20/2021   HGBA1C 6.6 (A) 03/08/2020   HGBA1C 7.9 (A) 12/07/2019   HGBA1C 7.9 (A) 09/07/2019   HGBA1C 6.8 (A) 06/02/2019   HGBA1C 8.3 (A) 11/24/2018   At last visit was on: - Trulicity 4.5 mg weekly >>  - could not obtain it from the pharmacy - Levemir 28 >> 36 >> 60 units in am >> ran out 2 days prior to the appointment Prev. On Metformin.  We changed to: - Tresiba U200 60 units at bedtime - FiAsp 8-10 units before the 3 meals He was not able to get Ozempic - unavailable at the pharmacy.  Pt checks his sugars 1-2x a day and they are: - am: 130-180 >> 114-168, 181 >> 76-190, 251 - 2h after b'fast: n/c - before lunch: n/c >> 132 >> 79, 121, 280 - 2h after lunch: n/c - before dinner: n/c - 2h after dinner: n/c - bedtime: 130-180 >> 151-203, 228 >> 159, 214-323 - nighttime: n/c Lowest sugar was 80 >> 130 >> 89 >> 79; he has hypoglycemia awareness at 80.  Highest sugar was 240 >> 185 >> 587 >> 378.  Glucometer: Freestyle lite  Pt's meals are: - Breakfast: protein shake - Lunch: largest meal: chicken tenders or salad - Dinner: pasta, chicken fried or baked - Snacks: multiple - fruit He has an air fryer.  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, last BUN/creatinine:  Lab Results  Component Value Date   BUN 33 (H) 06/06/2022   BUN 40 (H) 11/30/2021   CREATININE 2.69 (H) 06/06/2022   CREATININE 2.73 (H) 11/30/2021   He has proteinuria: Component 11/26/21 05/17/20 11/15/19 06/02/17 11/14/16  U Interval Random Random Random Random Random  U Volume U Protein Concentrate 60 U Creatinine Concentrate 202 84 139 139 190  U Protein/G Creatinine 0.00 - 200.00 MG/G CREATININE 297.03 High  119.05 223.02 High  86.33 73.68   -+ HL; last set of lipids: Lab Results  Component Value Date   CHOL 149 11/30/2021   HDL 45.50 11/30/2021   LDLCALC 87 11/30/2021   LDLDIRECT 164.0 07/15/2014   TRIG 80.0 11/30/2021   CHOLHDL 3 11/30/2021  On Lipitor 40 mg daily.  - last eye exam was on 07/2022. No DR reportedly.   - + numbness and tingling in his feet (L>R - 2/2 sciatica).  Last foot exam 03/14/2022.  Patient also has a history of HTN.  ROS: + see HPI  Past Medical History:  Diagnosis Date   CHEST PAIN 08/28/2009   DIABETES MELLITUS, TYPE II 03/20/2007   HYPERCHOLESTEROLEMIA 07/21/2008   HYPERTENSION 03/20/2007  Hypertensive urgency 03/27/2015   Left ventricular dysfunction 03/28/2015   EF 40-45%, no WMA, grade 2 diastolic dysfunction   Renal disorder    Past Surgical History:  Procedure Laterality Date   KNEE ARTHROSCOPY Right    Social History   Socioeconomic History   Marital status: Married    Spouse name: Not on file   Number of children: Not on file   Years of education: Not on file   Highest education level: Not on file  Occupational History   Occupation: English as a second language teacher: [employer]  Tobacco Use   Smoking status: Never   Smokeless tobacco: Never  Vaping Use   Vaping Use: Never used  Substance and Sexual Activity   Alcohol use: No   Drug use: No   Sexual activity: Not on file  Other Topics Concern   Not on file  Social History Narrative   Not on file    Social Determinants of Health   Financial Resource Strain: Not on file  Food Insecurity: Not on file  Transportation Needs: Not on file  Physical Activity: Not on file  Stress: Not on file  Social Connections: Not on file  Intimate Partner Violence: Not on file   Current Outpatient Medications on File Prior to Visit  Medication Sig Dispense Refill   aspirin EC 81 MG EC tablet Take 1 tablet (81 mg total) by mouth daily.     atorvastatin (LIPITOR) 40 MG tablet Take 1 tablet (40 mg total) by mouth daily. 90 tablet 2   BD PEN NEEDLE NANO U/F 32G X 4 MM MISC USE DAILY AS DIRECTED 100 each 3   carvedilol (COREG) 12.5 MG tablet TAKE 1 TABLET (12.5MG  TOTAL) BY MOUTH TWICE A DAY WITH MEALS 180 tablet 1   furosemide (LASIX) 20 MG tablet TAKE 1 TABLET (20 MG TOTAL) BY MOUTH DAILY AS NEEDED (FOR SHORTNESS OF BREATH AND SWELLING.). 30 tablet 0   glucose blood (FREESTYLE LITE) test strip 1 each by Other route 2 (two) times daily. And lancets 2/day 200 each 3   hydrALAZINE (APRESOLINE) 50 MG tablet Take 1 tablet by mouth 2 (two) times daily.     insulin aspart (FIASP FLEXTOUCH) 100 UNIT/ML FlexTouch Pen Inject 8-10 Units into the skin 3 (three) times daily before meals. 15 mL 5   insulin degludec (TRESIBA FLEXTOUCH) 200 UNIT/ML FlexTouch Pen Inject 60 units under skin daily 18 mL 3   Lancets (FREESTYLE) lancets Use to monitor glucose levels BID; E11.22 100 each 12   methylPREDNISolone (MEDROL DOSEPAK) 4 MG TBPK tablet 24 mg PO on day 1, then decr. by 4 mg/day x5 days 21 tablet 0   nitroGLYCERIN (NITROSTAT) 0.4 MG SL tablet Place 1 tablet (0.4 mg total) under the tongue every 5 (five) minutes x 3 doses as needed for chest pain. Don't take within 24 hrs of Cialis 25 tablet 0   Semaglutide, 1 MG/DOSE, 4 MG/3ML SOPN Inject 1 mg as directed once a week. 9 mL 3   tiZANidine (ZANAFLEX) 4 MG tablet Take 1 tablet (4 mg total) by mouth every 6 (six) hours as needed for muscle spasms. 30 tablet 0   Vitamin D,  Ergocalciferol, (DRISDOL) 1.25 MG (50000 UNIT) CAPS capsule Take 1 capsule (50,000 Units total) by mouth every Monday. 5 capsule 2   No current facility-administered medications on file prior to visit.   No Known Allergies Family History  Problem Relation Age of Onset   Diabetes Mother    Diabetes Father  Hypertension Father    Diabetes Maternal Grandfather    Diabetes Paternal Grandfather    Diabetes Brother    Hypertension Brother    PE: BP 128/80 (BP Location: Right Arm, Patient Position: Sitting, Cuff Size: Normal)   Pulse 75   Ht 6\' 3"  (1.905 m)   Wt 276 lb 9.6 oz (125.5 kg)   SpO2 98%   BMI 34.57 kg/m  Wt Readings from Last 3 Encounters:  10/22/22 276 lb 9.6 oz (125.5 kg)  06/18/22 252 lb 6.4 oz (114.5 kg)  06/06/22 252 lb (114.3 kg)   Constitutional: overweight, in NAD Eyes:  EOMI, no exophthalmos ENT: no neck masses, no cervical lymphadenopathy Cardiovascular: RRR, No MRG Respiratory: CTA B Musculoskeletal: no deformities Skin:no rashes Neurological: no tremor with outstretched hands  ASSESSMENT: 1. 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 HL  3.  Obesity class I  PLAN:  1. Patient with longstanding, uncontrolled, type 2 diabetes, on injectable antidiabetic regimen with basal/bolus insulin and weekly GLP-1 receptor agonist, with still poor control.  At last visit, HbA1c was higher, at 8.6%.  At that time, he was off his basal insulin so we restarted this: Recommended to try [place] U200 rather than Levemir, which was being phased out of the market.  He was also off his Trulicity and I recommended to try to start Ozempic for a stronger effect on his blood sugars.  At that time, he had steroids for back pain and sugars were as high as 500s.  I recommended to take rapid acting insulin before meals.   -At his previous visit, I also advised him to discuss with nephrology whether an SGLT2  inhibitor would be a good option for him, especially due to his history of CHF with decreased ejection fraction.  I also recommended to improve diet by stopping fried foods and high glycemic index snacks. -At today's visit, sugars are quite fluctuating, from the 70s to 300s.  The higher blood sugars are at night, but sometimes he also has sugars in the 200s in the morning.  He is using the [place] before each of the meals, which is appropriate.  However, sugars still increase in a stable fashion throughout the day so I suggested to start Ozempic, which is more readily available at the pharmacies now.  Will start at a low dose and increase as tolerated.  I did advise him to let me know if the sugars do not improve significantly so we can increase the dose of Ozempic.  I also advised him again to discuss with nephrology if we can can use an SGLT2 inhibitor.   - I suggested to:  Patient Instructions  Please continue: - Tresiba U200 60 units at bedtime - FiAsp 8-10 units before the 3 meals   Please start:  - Ozempic 0.25 mg weekly for 1-2 weeks, then increase to 0.5 mg weekly.  Please discuss with nephrology if we can use: [place] or Jardiance.  Please return in 3-4 months.  - we checked his HbA1c: 8.3% (slightly lower) - advised to check sugars at different times of the day - 3x a day, rotating check times - advised for yearly eye exams >> he is UTD - return to clinic in 3-4 months  2. HL -Reviewed the latest lipid panel from 11/2021: LDL above our target of less than 55, otherwise fractions at goal: Lab Results  Component Value Date   CHOL 149 11/30/2021  HDL 45.50 11/30/2021   LDLCALC 87 11/30/2021   LDLDIRECT 164.0 07/15/2014   TRIG 80.0 11/30/2021   CHOLHDL 3 11/30/2021  -He continues on Lipitor 40 mg daily without side effects  3.  Obesity class I -At today's visit, patient appears to have gained 24 pounds. -We absolutely need to start the GLP-1 receptor agonist and hopefully we  could also start an SGLT2 inhibitor  Carlus Pavlov, MD PhD [facility] Endocrinology

## 2022-10-22 NOTE — Patient Instructions (Addendum)
Please continue: - Tresiba U200 60 units at bedtime - FiAsp 8-10 units before the 3 meals   Please start:  - Ozempic 0.25 mg weekly for 1-2 weeks, then increase to 0.5 mg weekly.  Please discuss with nephrology if we can use: [place] or Jardiance.  Please return in 3-4 months.

## 2022-10-27 ENCOUNTER — Other Ambulatory Visit: Payer: Self-pay | Admitting: Internal Medicine

## 2022-11-25 ENCOUNTER — Encounter: Payer: Self-pay | Admitting: Internal Medicine

## 2022-11-25 MED ORDER — BD PEN NEEDLE NANO U/F 32G X 4 MM MISC: 3 refills | Status: AC

## 2022-11-28 ENCOUNTER — Encounter: Payer: Self-pay | Admitting: Nurse Practitioner

## 2022-11-28 ENCOUNTER — Ambulatory Visit (INDEPENDENT_AMBULATORY_CARE_PROVIDER_SITE_OTHER): Payer: 59 | Admitting: Nurse Practitioner

## 2022-11-28 VITALS — BP 137/88 | HR 78 | Temp 97.8°F | Ht 75.0 in | Wt 267.0 lb

## 2022-11-28 DIAGNOSIS — E1169 Type 2 diabetes mellitus with other specified complication: Secondary | ICD-10-CM

## 2022-11-28 DIAGNOSIS — Z0289 Encounter for other administrative examinations: Secondary | ICD-10-CM

## 2022-11-28 DIAGNOSIS — E1122 Type 2 diabetes mellitus with diabetic chronic kidney disease: Secondary | ICD-10-CM

## 2022-11-28 DIAGNOSIS — Z6833 Body mass index (BMI) 33.0-33.9, adult: Secondary | ICD-10-CM

## 2022-11-28 DIAGNOSIS — N184 Chronic kidney disease, stage 4 (severe): Secondary | ICD-10-CM | POA: Diagnosis not present

## 2022-11-28 DIAGNOSIS — E785 Hyperlipidemia, unspecified: Secondary | ICD-10-CM

## 2022-11-28 NOTE — Progress Notes (Signed)
Office: [phone]  /  Fax: [phone]   Initial Visit  Alexander [name] was seen in clinic today to evaluate for obesity. He is interested in losing weight to improve overall health and reduce the risk of weight related complications. He presents today to review program treatment options, initial physical assessment, and evaluation.     He was referred by: Friend or Family  When asked what else they would like to accomplish? He states: Improve existing medical conditions, Reduce number of medications, Improve quality of life, and Lose a target amount of weight : Goal weight 230 lbs  Weight history:  He started gaining weight while he was in college.  He started gaining weight recently due to pain from a herniated disc. He hasn't been able to walk like he has been in the past.  He received a cortisone injection and that has helped.    When asked how has your weight affected you? He states: Contributed to medical problems, Contributed to orthopedic problems or mobility issues, Having fatigue, and Having poor endurance  Some associated conditions:  Aortic atherosclerosis, HTN, CHF, GERD, DMT2 with stage 4 chronic kidney disease, hearing loss, back pain anemia, vit d def  Contributing factors: Family history, Stress, Reduced physical activity, and Life event  Weight promoting medications identified: None  Current nutrition plan: None  Current level of physical activity: Limited due to chronic pain or orthopedic problems  Current or previous pharmacotherapy: None  Response to medication: Never tried medications   Past medical history includes:   Past Medical History:  Diagnosis Date   CHEST PAIN 08/28/2009   DIABETES MELLITUS, TYPE II 03/20/2007   HYPERCHOLESTEROLEMIA 07/21/2008   HYPERTENSION 03/20/2007   Hypertensive urgency 03/27/2015   Left ventricular dysfunction 03/28/2015   EF 40-45%, no WMA, grade 2 diastolic dysfunction   Renal disorder      Objective:   BP  137/88   Pulse 78   Temp 97.8 F (36.6 C)   Ht 6\' 3"  (1.905 m)   Wt 267 lb (121.1 kg)   SpO2 98%   BMI 33.37 kg/m  He was weighed on the bioimpedance scale: Body mass index is 33.37 kg/m.  Peak Weight:272 lbs , Body Fat%:35.3%, Visceral Fat Rating:18, Weight trend over the last 12 months: Increasing  General:  Alert, oriented and cooperative. Patient is in no acute distress.  Respiratory: Normal respiratory effort, no problems with respiration noted   Gait: able to ambulate independently  Mental Status: Normal mood and affect. Normal behavior. Normal judgment and thought content.   DIAGNOSTIC DATA REVIEWED:  BMET    Component Value Date/Time   NA 136 06/06/2022 0908   K 3.9 06/06/2022 0908   CL 100 06/06/2022 0908   CO2 28 06/06/2022 0908   GLUCOSE 161 (H) 06/06/2022 0908   BUN 33 (H) 06/06/2022 0908   CREATININE 2.69 (H) 06/06/2022 0908   CREATININE 2.04 (H) 04/07/2015 1641   CALCIUM 8.8 06/06/2022 0908   GFRNONAA 33 (L) 04/05/2017 0537   GFRAA 38 (L) 04/05/2017 0537   Lab Results  Component Value Date   HGBA1C 8.3 (A) 10/22/2022   HGBA1C 12.1 (H) 10/27/2006   No results found for: "INSULIN" CBC    Component Value Date/Time   WBC 9.4 11/30/2021 1151   RBC 4.73 11/30/2021 1151   HGB 12.8 (L) 11/30/2021 1151   HCT 38.7 (L) 11/30/2021 1151   PLT 281.0 11/30/2021 1151   MCV 81.8 11/30/2021 1151   MCV 80.1 12/01/2014 1716  MCH 26.8 04/05/2017 0537   MCHC 33.1 11/30/2021 1151   RDW 14.7 11/30/2021 1151   Iron/TIBC/Ferritin/ %Sat No results found for: "IRON", "TIBC", "FERRITIN", "IRONPCTSAT" Lipid Panel     Component Value Date/Time   CHOL 149 11/30/2021 1151   CHOL 152 07/27/2019 0823   TRIG 80.0 11/30/2021 1151   HDL 45.50 11/30/2021 1151   HDL 47 07/27/2019 0823   CHOLHDL 3 11/30/2021 1151   VLDL 16.0 11/30/2021 1151   LDLCALC 87 11/30/2021 1151   LDLCALC 88 07/27/2019 0823   LDLDIRECT 164.0 07/15/2014 1703   Hepatic Function Panel     Component  Value Date/Time   PROT 7.3 10/11/2016 1545   ALBUMIN 4.0 06/06/2022 0908   AST 21 10/11/2016 1545   ALT 22 10/11/2016 1545   ALKPHOS 54 10/11/2016 1545   BILITOT 0.5 10/11/2016 1545   BILIDIR 0.1 01/08/2011 1012      Component Value Date/Time   TSH 0.92 11/30/2021 1151     Assessment and Plan:   Diabetes mellitus with stage 4 chronic kidney disease (HCC) Continue to follow-up with PCP, endocrinology and nephrology.  Continue medications as directed.  Hyperlipidemia associated with type 2 diabetes mellitus (HCC) Continue to follow-up with PCP, endocrinology and nephrology.  Continue medications as directed.  Morbid obesity (HCC)  BMI 33.0-33.9,adult        Obesity Treatment / Action Plan:  Patient will work on garnering support from family and friends to begin weight loss journey. Will work on eliminating or reducing the presence of highly palatable, calorie dense foods in the home. Will complete provided nutritional and psychosocial assessment questionnaire before the next appointment. Will be scheduled for indirect calorimetry to determine resting energy expenditure in a fasting state.  This will allow [place] to create a reduced calorie, high-protein meal plan to promote loss of fat mass while preserving muscle mass. Counseled on the health benefits of losing 5%-15% of total body weight. Was counseled on nutritional approaches to weight loss and benefits of reducing processed foods and consuming plant-based foods and high quality protein as part of nutritional weight management. Was counseled on pharmacotherapy and role as an adjunct in weight management.   Obesity Education Performed Today:  He was weighed on the bioimpedance scale and results were discussed and documented in the synopsis.  We discussed obesity as a disease and the importance of a more detailed evaluation of all the factors contributing to the disease.  We discussed the importance of long term lifestyle  changes which include nutrition, exercise and behavioral modifications as well as the importance of customizing this to his specific health and social needs.  We discussed the benefits of reaching a healthier weight to alleviate the symptoms of existing conditions and reduce the risks of the biomechanical, metabolic and psychological effects of obesity.  Heath Lark appears to be in the action stage of change and states they are ready to start intensive lifestyle modifications and behavioral modifications.  30 minutes was spent today on this visit including the above counseling, pre-visit chart review, and post-visit documentation.  Reviewed by clinician on day of visit: allergies, medications, problem list, medical history, surgical history, family history, social history, and previous encounter notes pertinent to obesity diagnosis.    Theodis Sato Auna Mikkelsen FNP-C

## 2022-12-05 ENCOUNTER — Ambulatory Visit: Payer: 59 | Admitting: Internal Medicine

## 2022-12-05 ENCOUNTER — Encounter: Payer: Self-pay | Admitting: Internal Medicine

## 2022-12-05 VITALS — BP 130/82 | HR 79 | Temp 98.4°F | Ht 75.0 in | Wt 274.8 lb

## 2022-12-05 DIAGNOSIS — Z0000 Encounter for general adult medical examination without abnormal findings: Secondary | ICD-10-CM

## 2022-12-05 DIAGNOSIS — Z125 Encounter for screening for malignant neoplasm of prostate: Secondary | ICD-10-CM

## 2022-12-05 DIAGNOSIS — E1122 Type 2 diabetes mellitus with diabetic chronic kidney disease: Secondary | ICD-10-CM

## 2022-12-05 DIAGNOSIS — E785 Hyperlipidemia, unspecified: Secondary | ICD-10-CM | POA: Diagnosis not present

## 2022-12-05 DIAGNOSIS — E1169 Type 2 diabetes mellitus with other specified complication: Secondary | ICD-10-CM

## 2022-12-05 DIAGNOSIS — N184 Chronic kidney disease, stage 4 (severe): Secondary | ICD-10-CM | POA: Diagnosis not present

## 2022-12-05 DIAGNOSIS — I110 Hypertensive heart disease with heart failure: Secondary | ICD-10-CM

## 2022-12-05 DIAGNOSIS — I700 Atherosclerosis of aorta: Secondary | ICD-10-CM

## 2022-12-05 DIAGNOSIS — Z7985 Long-term (current) use of injectable non-insulin antidiabetic drugs: Secondary | ICD-10-CM

## 2022-12-05 DIAGNOSIS — I10 Essential (primary) hypertension: Secondary | ICD-10-CM

## 2022-12-05 LAB — LIPID PANEL
Cholesterol: 159 mg/dL (ref 0–200)
HDL: 41.2 mg/dL (ref >39.00)
LDL Cholesterol: 98 mg/dL (ref 0–99)
NonHDL: 117.81
Total CHOL/HDL Ratio: 4
Triglycerides: 98 mg/dL (ref 0.0–149.0)
VLDL: 19.6 mg/dL (ref 0.0–40.0)

## 2022-12-05 LAB — RENAL FUNCTION PANEL
Albumin: 3.9 g/dL (ref 3.5–5.2)
BUN: 38 mg/dL — ABNORMAL HIGH (ref 6–23)
CO2: 24 mEq/L (ref 19–32)
Calcium: 8.9 mg/dL (ref 8.4–10.5)
Chloride: 105 mEq/L (ref 96–112)
Creatinine, Ser: 2.81 mg/dL — ABNORMAL HIGH (ref 0.40–1.50)
GFR: 25.31 mL/min — ABNORMAL LOW (ref >60.00)
Glucose, Bld: 97 mg/dL (ref 70–99)
Phosphorus: 3.9 mg/dL (ref 2.3–4.6)
Potassium: 4.4 mEq/L (ref 3.5–5.1)
Sodium: 139 mEq/L (ref 135–145)

## 2022-12-05 LAB — MICROALBUMIN / CREATININE URINE RATIO
Creatinine,U: 109.4 mg/dL
Microalb Creat Ratio: 29.2 mg/g (ref 0.0–30.0)
Microalb, Ur: 32 mg/dL — ABNORMAL HIGH (ref 0.0–1.9)

## 2022-12-05 LAB — PSA: PSA: 0.6 ng/mL (ref 0.10–4.00)

## 2022-12-05 NOTE — Patient Instructions (Signed)
Think about getting the shingles vaccine.  

## 2022-12-05 NOTE — Progress Notes (Signed)
   Subjective:   Patient ID: Alexander [name], male    DOB: 08/28/1970, 52 y.o.   MRN: [id number]  HPI The patient is a 52 YO man coming in for physical.  PMH, FMH, social history reviewed and updated  Review of Systems  Constitutional: Negative.   HENT: Negative.    Eyes: Negative.   Respiratory:  Negative for cough, chest tightness and shortness of breath.   Cardiovascular:  Negative for chest pain, palpitations and leg swelling.  Gastrointestinal:  Negative for abdominal distention, abdominal pain, constipation, diarrhea, nausea and vomiting.  Musculoskeletal: Negative.   Skin: Negative.   Neurological: Negative.   Psychiatric/Behavioral: Negative.      Objective:  Physical Exam Constitutional:      Appearance: He is well-developed.  HENT:     Head: Normocephalic and atraumatic.  Cardiovascular:     Rate and Rhythm: Normal rate and regular rhythm.  Pulmonary:     Effort: Pulmonary effort is normal. No respiratory distress.     Breath sounds: Normal breath sounds. No wheezing or rales.  Abdominal:     General: Bowel sounds are normal. There is no distension.     Palpations: Abdomen is soft.     Tenderness: There is no abdominal tenderness. There is no rebound.  Musculoskeletal:     Cervical back: Normal range of motion.  Skin:    General: Skin is warm and dry.  Neurological:     Mental Status: He is alert and oriented to person, place, and time.     Coordination: Coordination normal.     Vitals:   12/05/22 0843  BP: 130/82  Pulse: 79  Temp: 98.4 F (36.9 C)  TempSrc: Oral  SpO2: 96%  Weight: 274 lb 12.8 oz (124.6 kg)  Height: 6\' 3"  (1.905 m)    Assessment & Plan:

## 2022-12-06 NOTE — Assessment & Plan Note (Signed)
Flu shot yearly. Shingrix counseled declines. Tetanus up to date. Colonoscopy up to date. Counseled about sun safety and mole surveillance. Counseled about the dangers of distracted driving. Given 10 year screening recommendations.

## 2022-12-06 NOTE — Assessment & Plan Note (Signed)
Taking lipitor 40 mg daily and checking lipid panel today.

## 2022-12-06 NOTE — Assessment & Plan Note (Signed)
Checking renal function and will forward to [facility] where he sees them regularly. BP at goal today. Diabetes not quite at goal.

## 2022-12-06 NOTE — Assessment & Plan Note (Signed)
Not at goal recently likely due to multiple steroid injections related to his back. This is feeling improved lately and he is working with endo on control. Checking microalbumin to creatinine ratio today and lipid panel.

## 2022-12-06 NOTE — Assessment & Plan Note (Signed)
Overall stable and euvolemic today. He is taking coreg 12.5 mg BID and lipitor 40 mg daily and aspirin 81 mg daily. No flare today.

## 2022-12-06 NOTE — Assessment & Plan Note (Signed)
BP improved today with control of back pain. He is taking hydralazine 50 mg BID and coreg 12.5 mg BID and lasix 20 mg daily prn. Checking renal function and adjust as needed.

## 2022-12-06 NOTE — Assessment & Plan Note (Signed)
Checking lipid panel and adjust as needed lipitor 40 mg daily for LDL <100.

## 2023-01-22 ENCOUNTER — Ambulatory Visit: Payer: 59 | Admitting: Bariatrics

## 2023-01-22 ENCOUNTER — Encounter: Payer: Self-pay | Admitting: Bariatrics

## 2023-01-22 VITALS — BP 159/93 | HR 84 | Temp 98.1°F | Ht 75.0 in | Wt 266.0 lb

## 2023-01-22 DIAGNOSIS — E1169 Type 2 diabetes mellitus with other specified complication: Secondary | ICD-10-CM

## 2023-01-22 DIAGNOSIS — E1122 Type 2 diabetes mellitus with diabetic chronic kidney disease: Secondary | ICD-10-CM | POA: Diagnosis not present

## 2023-01-22 DIAGNOSIS — Z6833 Body mass index (BMI) 33.0-33.9, adult: Secondary | ICD-10-CM | POA: Insufficient documentation

## 2023-01-22 DIAGNOSIS — Z794 Long term (current) use of insulin: Secondary | ICD-10-CM

## 2023-01-22 DIAGNOSIS — E559 Vitamin D deficiency, unspecified: Secondary | ICD-10-CM | POA: Diagnosis not present

## 2023-01-22 DIAGNOSIS — R0602 Shortness of breath: Secondary | ICD-10-CM | POA: Diagnosis not present

## 2023-01-22 DIAGNOSIS — N184 Chronic kidney disease, stage 4 (severe): Secondary | ICD-10-CM | POA: Diagnosis not present

## 2023-01-22 DIAGNOSIS — Z7985 Long-term (current) use of injectable non-insulin antidiabetic drugs: Secondary | ICD-10-CM

## 2023-01-22 DIAGNOSIS — F32A Depression, unspecified: Secondary | ICD-10-CM | POA: Diagnosis not present

## 2023-01-22 DIAGNOSIS — R5383 Other fatigue: Secondary | ICD-10-CM

## 2023-01-22 DIAGNOSIS — Z1331 Encounter for screening for depression: Secondary | ICD-10-CM

## 2023-01-22 DIAGNOSIS — Z0000 Encounter for general adult medical examination without abnormal findings: Secondary | ICD-10-CM

## 2023-01-22 DIAGNOSIS — E785 Hyperlipidemia, unspecified: Secondary | ICD-10-CM

## 2023-01-23 LAB — COMPREHENSIVE METABOLIC PANEL
ALT: 27 IU/L (ref 0–44)
AST: 25 IU/L (ref 0–40)
Albumin: 4.1 g/dL (ref 3.8–4.9)
Alkaline Phosphatase: 81 IU/L (ref 44–121)
BUN/Creatinine Ratio: 13 (ref 9–20)
BUN: 36 mg/dL — ABNORMAL HIGH (ref 6–24)
Bilirubin Total: 0.5 mg/dL (ref 0.0–1.2)
CO2: 23 mmol/L (ref 20–29)
Calcium: 9.3 mg/dL (ref 8.7–10.2)
Chloride: 104 mmol/L (ref 96–106)
Creatinine, Ser: 2.78 mg/dL — ABNORMAL HIGH (ref 0.76–1.27)
Globulin, Total: 3 g/dL (ref 1.5–4.5)
Glucose: 118 mg/dL — ABNORMAL HIGH (ref 70–99)
Potassium: 4.6 mmol/L (ref 3.5–5.2)
Sodium: 142 mmol/L (ref 134–144)
Total Protein: 7.1 g/dL (ref 6.0–8.5)
eGFR: 27 mL/min/{1.73_m2} — ABNORMAL LOW (ref >59)

## 2023-01-23 LAB — LIPID PANEL WITH LDL/HDL RATIO
Cholesterol, Total: 155 mg/dL (ref 100–199)
HDL: 45 mg/dL (ref >39)
LDL Chol Calc (NIH): 96 mg/dL (ref 0–99)
LDL/HDL Ratio: 2.1 ratio (ref 0.0–3.6)
Triglycerides: 74 mg/dL (ref 0–149)
VLDL Cholesterol Cal: 14 mg/dL (ref 5–40)

## 2023-01-23 LAB — TSH: TSH: 1.21 u[IU]/mL (ref 0.450–4.500)

## 2023-01-23 LAB — CBC WITH DIFFERENTIAL/PLATELET
Basophils Absolute: 0.1 10*3/uL (ref 0.0–0.2)
Basos: 1 %
EOS (ABSOLUTE): 0.6 10*3/uL — ABNORMAL HIGH (ref 0.0–0.4)
Eos: 7 %
Hematocrit: 37.8 % (ref 37.5–51.0)
Hemoglobin: 12.4 g/dL — ABNORMAL LOW (ref 13.0–17.7)
Immature Grans (Abs): 0 10*3/uL (ref 0.0–0.1)
Immature Granulocytes: 0 %
Lymphocytes Absolute: 1.7 10*3/uL (ref 0.7–3.1)
Lymphs: 22 %
MCH: 26.4 pg — ABNORMAL LOW (ref 26.6–33.0)
MCHC: 32.8 g/dL (ref 31.5–35.7)
MCV: 81 fL (ref 79–97)
Monocytes Absolute: 0.5 10*3/uL (ref 0.1–0.9)
Monocytes: 6 %
Neutrophils Absolute: 5.2 10*3/uL (ref 1.4–7.0)
Neutrophils: 64 %
Platelets: 279 10*3/uL (ref 150–450)
RBC: 4.69 x10E6/uL (ref 4.14–5.80)
RDW: 12.8 % (ref 11.6–15.4)
WBC: 8.1 10*3/uL (ref 3.4–10.8)

## 2023-01-23 LAB — HEMOGLOBIN A1C
Est. average glucose Bld gHb Est-mCnc: 157 mg/dL
Hgb A1c MFr Bld: 7.1 % — ABNORMAL HIGH (ref 4.8–5.6)

## 2023-01-23 LAB — VITAMIN D 25 HYDROXY (VIT D DEFICIENCY, FRACTURES): Vit D, 25-Hydroxy: 52.6 ng/mL (ref 30.0–100.0)

## 2023-01-23 LAB — VITAMIN B12: Vitamin B-12: 452 pg/mL (ref 232–1245)

## 2023-01-23 LAB — INSULIN, RANDOM: INSULIN: 5.2 u[IU]/mL (ref 2.6–24.9)

## 2023-01-28 NOTE — Progress Notes (Signed)
Chief Complaint:   OBESITY Alexander [name] (MR# [id number]) is a 52 y.o. male who presents for evaluation and treatment of obesity and related comorbidities. Current BMI is Body mass index is 33.25 kg/m. Alexander [name] has been struggling with his weight for many years and has been unsuccessful in either losing weight, maintaining weight loss, or reaching his healthy weight goal.  Alexander [name] is currently in the action stage of change and ready to dedicate time achieving and maintaining a healthier weight. Alexander [name] is interested in becoming our patient and working on intensive lifestyle modifications including (but not limited to) diet and exercise for weight loss.  Patient met with Judeth Cornfield, nurse practitioner on 11/28/2022.  Alexander [name]'s habits were reviewed today and are as follows: His family eats meals together, he thinks his family will eat healthier with him, his desired weight loss is 46-51 lbs, he started gaining weight in college, his heaviest weight ever was 270 pounds, he has significant food cravings issues, he snacks frequently in the evenings, he is frequently drinking liquids with calories, he frequently makes poor food choices, he frequently eats larger portions than normal, and he struggles with emotional eating.  Depression Screen Alexander [name]'s Food and Mood (modified PHQ-9) score was 9.  Subjective:   1. Other fatigue Alexander [name] admits to daytime somnolence and admits to waking up still tired. Patient has a history of symptoms of daytime fatigue and morning fatigue. Alexander [name] generally gets 6 or 7 hours of sleep per night, and states that he has nightime awakenings and generally restful sleep. Snoring is present. Apneic episodes are present. Epworth Sleepiness Score is 11.   2. SOB (shortness of breath) on exertion Alexander [name] notes increasing shortness of breath with exercising and seems to be worsening over time with weight gain. He notes getting out of breath sooner with activity than he used to.  This has not gotten worse recently. Alexander [name] denies shortness of breath at rest or orthopnea.  3. Diabetes mellitus with stage 4 chronic kidney disease (HCC) Patient was diagnosed at age 57.  His last A1c was 8.3 on 10/22/2022.  His fasting blood sugars range between 115-120.  He is taking Ozempic and [place].  4. Hyperlipidemia associated with type 2 diabetes mellitus (HCC) Patient is taking Lipitor.  5. Health care maintenance Given obesity.   6. CKD (chronic kidney disease) stage 4, GFR 15-29 ml/min [facility]) Patient sees [facility].  His last GFR was 25.31.  Urine symptoms.  7. Vitamin D deficiency Patient is not on vitamin supplementations.  Assessment/Plan:   1. Other fatigue Alexander [name] does feel that his weight is causing his energy to be lower than it should be. Fatigue may be related to obesity, depression or many other causes. Labs will be ordered, and in the meanwhile, Alexander [name] will focus on self care including making healthy food choices, increasing physical activity and focusing on stress reduction.  - EKG 12-Lead - TSH  2. SOB (shortness of breath) on exertion Alexander [name] does feel that he gets out of breath more easily that he used to when he exercises. Alexander [name]'s shortness of breath appears to be obesity related and exercise induced. He has agreed to work on weight loss and gradually increase exercise to treat his exercise induced shortness of breath. Will continue to monitor closely.  - TSH  3. Diabetes mellitus with stage 4 chronic kidney disease (HCC) We will check labs today, and we will follow-up at his next visit.  Patient will continue his medications as directed.  -  Hemoglobin A1c - Insulin, random  4. Hyperlipidemia associated with type 2 diabetes mellitus (HCC) We will check labs today, and we will follow-up at his next visit.  Patient will continue his medications as directed.  - Lipid Panel With LDL/HDL Ratio  5. Health care maintenance We will check labs today,  we will follow-up at patient's next visit.  EKG and IC were done today and reviewed with the patient.  - Hemoglobin A1c - Insulin, random - Vitamin B12 - Lipid Panel With LDL/HDL Ratio - VITAMIN D 25 Hydroxy (Vit-D Deficiency, Fractures) - TSH - Comprehensive metabolic panel - CBC with Differential/Platelet  6. CKD (chronic kidney disease) stage 4, GFR 15-29 ml/min (HCC) We will check labs today.  Patient does not have water restriction, he is to decrease protein, and decrease high potassium foods (cheese, peanuts, milk, potatoes, seeds, and yogurt, bananas).  Reviewed urine symptoms and side effects: Weight loss, cognitive dysfunction, fatigue, shortness of breath, metallic taste in mouth.  Patient will follow-up with Nephrologist in 3 to 4 months.  Will cut back on water if swelling.  - Comprehensive metabolic panel - CBC with Differential/Platelet  7. Vitamin D deficiency We will check labs today, and we will follow-up at patient's next visit.    - VITAMIN D 25 Hydroxy (Vit-D Deficiency, Fractures)  8. Depression screening Alexander [name] had a positive depression screening. Depression is commonly associated with obesity and often results in emotional eating behaviors. We will monitor this closely and work on CBT to help improve the non-hunger eating patterns. Referral to Psychology may be required if no improvement is seen as he continues in our clinic.  9. Morbid obesity (HCC)  10. BMI 33.0-33.9,adult Alexander [name] is currently in the action stage of change and his goal is to continue with weight loss efforts. I recommend Alexander [name] begin the structured treatment plan as follows:  He has agreed to the Category 2 Plan and keeping a food journal and adhering to recommended goals of 1200 calories and 60 grams of protein.  Meal plan was discussed.  Review labs with the patient from 12/05/2022, BMP, lipids, glucose, and A1c.  Will do portion control and make good choices.  Exercise goals: Walk on the  treadmill, and start riding stationary bike 1 week and weights.  Behavioral modification strategies: increasing lean protein intake, decreasing simple carbohydrates, increasing vegetables, increasing water intake, decreasing eating out, no skipping meals, meal planning and cooking strategies, keeping healthy foods in the home, and planning for success.  He was informed of the importance of frequent follow-up visits to maximize his success with intensive lifestyle modifications for his multiple health conditions. He was informed we would discuss his lab results at his next visit unless there is a critical issue that needs to be addressed sooner. Alexander [name] agreed to keep his next visit at the agreed upon time to discuss these results.  Objective:   Blood pressure (!) 159/93, pulse 84, temperature 98.1 F (36.7 C), height 6\' 3"  (1.905 m), weight 266 lb (120.7 kg), SpO2 99%. Body mass index is 33.25 kg/m.  EKG: Normal sinus rhythm, rate 81 BPM.  Indirect Calorimeter completed today shows a VO2 of 245 and a REE of 1685.  His calculated basal metabolic rate is 5366 thus his basal metabolic rate is worse than expected.  General: Cooperative, alert, well developed, in no acute distress. HEENT: Conjunctivae and lids unremarkable. Cardiovascular: Regular rhythm.  Lungs: Normal work of breathing. Neurologic: No focal deficits.   Lab Results  Component Value  Date   CREATININE 2.78 (H) 01/22/2023   BUN 36 (H) 01/22/2023   NA 142 01/22/2023   K 4.6 01/22/2023   CL 104 01/22/2023   CO2 23 01/22/2023   Lab Results  Component Value Date   ALT 27 01/22/2023   AST 25 01/22/2023   ALKPHOS 81 01/22/2023   BILITOT 0.5 01/22/2023   Lab Results  Component Value Date   HGBA1C 7.1 (H) 01/22/2023   HGBA1C 8.3 (A) 10/22/2022   HGBA1C 8.6 (A) 06/18/2022   HGBA1C 7.5 (A) 03/14/2022   HGBA1C 7.9 (A) 09/05/2021   Lab Results  Component Value Date   INSULIN 5.2 01/22/2023   Lab Results  Component  Value Date   TSH 1.210 01/22/2023   Lab Results  Component Value Date   CHOL 155 01/22/2023   HDL 45 01/22/2023   LDLCALC 96 01/22/2023   LDLDIRECT 164.0 07/15/2014   TRIG 74 01/22/2023   CHOLHDL 4 12/05/2022   Lab Results  Component Value Date   WBC 8.1 01/22/2023   HGB 12.4 (L) 01/22/2023   HCT 37.8 01/22/2023   MCV 81 01/22/2023   PLT 279 01/22/2023   No results found for: "IRON", "TIBC", "FERRITIN"  Attestation Statements:   Reviewed by clinician on day of visit: allergies, medications, problem list, medical history, surgical history, family history, social history, and previous encounter notes.  Time spent on visit including pre-visit chart review and post-visit charting and care was 45 minutes.   Trude Mcburney, am acting as Energy manager for [employer], DO.  I have reviewed the above documentation for accuracy and completeness, and I agree with the above. Corinna Capra, DO

## 2023-02-04 ENCOUNTER — Ambulatory Visit: Payer: 59 | Admitting: Nurse Practitioner

## 2023-02-04 VITALS — BP 147/84 | HR 76 | Temp 98.2°F | Ht 75.0 in | Wt 263.0 lb

## 2023-02-04 DIAGNOSIS — E1169 Type 2 diabetes mellitus with other specified complication: Secondary | ICD-10-CM

## 2023-02-04 DIAGNOSIS — Z794 Long term (current) use of insulin: Secondary | ICD-10-CM

## 2023-02-04 DIAGNOSIS — N184 Chronic kidney disease, stage 4 (severe): Secondary | ICD-10-CM

## 2023-02-04 DIAGNOSIS — E1122 Type 2 diabetes mellitus with diabetic chronic kidney disease: Secondary | ICD-10-CM

## 2023-02-04 DIAGNOSIS — Z6832 Body mass index (BMI) 32.0-32.9, adult: Secondary | ICD-10-CM

## 2023-02-04 DIAGNOSIS — E785 Hyperlipidemia, unspecified: Secondary | ICD-10-CM

## 2023-02-04 DIAGNOSIS — I110 Hypertensive heart disease with heart failure: Secondary | ICD-10-CM

## 2023-02-04 DIAGNOSIS — Z7985 Long-term (current) use of injectable non-insulin antidiabetic drugs: Secondary | ICD-10-CM

## 2023-02-04 NOTE — Progress Notes (Signed)
Office: [phone]  /  Fax: [phone]  WEIGHT SUMMARY AND BIOMETRICS  Weight Lost Since Last Visit: 3lb  Weight Gained Since Last Visit: 0lb   Vitals Temp: 98.2 F (36.8 C) BP: (!) 147/84 Pulse Rate: 76 SpO2: 99 %   Anthropometric Measurements Height: 6\' 3"  (1.905 m) Weight: 263 lb (119.3 kg) BMI (Calculated): 32.87 Weight at Last Visit: 266lb Weight Lost Since Last Visit: 3lb Weight Gained Since Last Visit: 0lb Starting Weight: 266lb Total Weight Loss (lbs): 3 lb (1.361 kg)   Body Composition  Body Fat %: 35.1 % Fat Mass (lbs): 92.4 lbs Muscle Mass (lbs): 162.4 lbs Total Body Water (lbs): 120.8 lbs Visceral Fat Rating : 18   Other Clinical Data Fasting: No Labs: No Today's Visit #: 2 Starting Date: 01/22/23     HPI  Chief Complaint: OBESITY  Alexander [name] is here to discuss his progress with his obesity treatment plan. He is on the the Category 2 Plan and states he is following his eating plan approximately 20 % of the time. He states he is exercising 30 minutes 2 days per week.  Interval History:  Since last office visit he has lost 3 pounds.  He has been watching his portion sizes and not eating late at night.  He is aiming to eat  more protein.  He has reduced snacking at work.  He is drinking more water and less sodas.  Struggles with stopping tea.  He is staying active-walking.   Notes some hunger/cravings.   Pharmacotherapy for weight loss: He is not currently taking medications  for medical weight loss.    Previous pharmacotherapy for medical weight loss:  none  Bariatric surgery:  Patient has not had bariatric surgery.  Hypertension Hypertension: BP is elevated today.  Medication(s): Coreg 12.5mg  BID, Lasix 20mg  PRN (twice per week), hydralazine 50mg  BID Denies chest pain, palpitations and SOB.  BP Readings from Last 3 Encounters:  02/04/23 (!) 147/84  01/22/23 (!) 159/93  12/05/22 130/82   Lab Results  Component Value Date    CREATININE 2.78 (H) 01/22/2023   CREATININE 2.81 (H) 12/05/2022   CREATININE 2.69 (H) 06/06/2022     Hyperlipidemia Medication(s): Lipitor 40mg . Denies side effects.   Cardiovascular risk factors: diabetes mellitus, dyslipidemia, male gender, and obesity (BMI >= 30 kg/m2)  Lab Results  Component Value Date   CHOL 155 01/22/2023   HDL 45 01/22/2023   LDLCALC 96 01/22/2023   LDLDIRECT 164.0 07/15/2014   TRIG 74 01/22/2023   CHOLHDL 4 12/05/2022   Lab Results  Component Value Date   ALT 27 01/22/2023   AST 25 01/22/2023   ALKPHOS 81 01/22/2023   BILITOT 0.5 01/22/2023   The 10-year ASCVD risk score (Arnett DK, et al., 2019) is: 20.5%   Values used to calculate the score:     Age: 52 years     Sex: Male     Is Non-Hispanic African American: Yes     Diabetic: Yes     Tobacco smoker: No     Systolic Blood Pressure: 147 mmHg     Is BP treated: Yes     HDL Cholesterol: 45 mg/dL     Total Cholesterol: 155 mg/dL   Pharmacotherapy for DMT2:  He is currently taking Tresiba 60 units and Ozempic 0.5mg .  Denies side effects. Saw endo last on 10/22/22   Last A1c was 7.1 Episodes of hypoglycemia: no On ASA 81mg  and statin.   Lab Results  Component Value Date  HGBA1C 7.1 (H) 01/22/2023   HGBA1C 8.3 (A) 10/22/2022   HGBA1C 8.6 (A) 06/18/2022   Lab Results  Component Value Date   MICROALBUR 32.0 (H) 12/05/2022   LDLCALC 96 01/22/2023   CREATININE 2.78 (H) 01/22/2023     PHYSICAL EXAM:  Blood pressure (!) 147/84, pulse 76, temperature 98.2 F (36.8 C), height 6\' 3"  (1.905 m), weight 263 lb (119.3 kg), SpO2 99%. Body mass index is 32.87 kg/m.  General: He is overweight, cooperative, alert, well developed, and in no acute distress. PSYCH: Has normal mood, affect and thought process.   Extremities: No edema.  Neurologic: No gross sensory or motor deficits. No tremors or fasciculations noted.    DIAGNOSTIC DATA REVIEWED:  BMET    Component Value Date/Time   NA 142  01/22/2023 0953   K 4.6 01/22/2023 0953   CL 104 01/22/2023 0953   CO2 23 01/22/2023 0953   GLUCOSE 118 (H) 01/22/2023 0953   GLUCOSE 97 12/05/2022 0928   BUN 36 (H) 01/22/2023 0953   CREATININE 2.78 (H) 01/22/2023 0953   CREATININE 2.04 (H) 04/07/2015 1641   CALCIUM 9.3 01/22/2023 0953   GFRNONAA 33 (L) 04/05/2017 0537   GFRAA 38 (L) 04/05/2017 0537   Lab Results  Component Value Date   HGBA1C 7.1 (H) 01/22/2023   HGBA1C 12.1 (H) 10/27/2006   Lab Results  Component Value Date   INSULIN 5.2 01/22/2023   Lab Results  Component Value Date   TSH 1.210 01/22/2023   CBC    Component Value Date/Time   WBC 8.1 01/22/2023 0953   WBC 9.4 11/30/2021 1151   RBC 4.69 01/22/2023 0953   RBC 4.73 11/30/2021 1151   HGB 12.4 (L) 01/22/2023 0953   HCT 37.8 01/22/2023 0953   PLT 279 01/22/2023 0953   MCV 81 01/22/2023 0953   MCH 26.4 (L) 01/22/2023 0953   MCH 26.8 04/05/2017 0537   MCHC 32.8 01/22/2023 0953   MCHC 33.1 11/30/2021 1151   RDW 12.8 01/22/2023 0953   Iron Studies No results found for: "IRON", "TIBC", "FERRITIN", "IRONPCTSAT" Lipid Panel     Component Value Date/Time   CHOL 155 01/22/2023 0953   TRIG 74 01/22/2023 0953   HDL 45 01/22/2023 0953   CHOLHDL 4 12/05/2022 0928   VLDL 19.6 12/05/2022 0928   LDLCALC 96 01/22/2023 0953   LDLDIRECT 164.0 07/15/2014 1703   Hepatic Function Panel     Component Value Date/Time   PROT 7.1 01/22/2023 0953   ALBUMIN 4.1 01/22/2023 0953   AST 25 01/22/2023 0953   ALT 27 01/22/2023 0953   ALKPHOS 81 01/22/2023 0953   BILITOT 0.5 01/22/2023 0953   BILIDIR 0.1 01/08/2011 1012      Component Value Date/Time   TSH 1.210 01/22/2023 0953   Nutritional Lab Results  Component Value Date   VD25OH 52.6 01/22/2023   VD25OH 52.43 11/30/2021     ASSESSMENT AND PLAN  TREATMENT PLAN FOR OBESITY:  Recommended Dietary Goals  Alexander [name] is currently in the action stage of change. As such, his goal is to continue weight  management plan. He has agreed to the Category 2 Plan.  Behavioral Intervention  We discussed the following Behavioral Modification Strategies today: increasing lean protein intake, decreasing simple carbohydrates , increasing vegetables, increasing lower glycemic fruits, avoiding skipping meals, increasing water intake, work on meal planning and preparation, continue to practice mindfulness when eating, and planning for success.  Additional resources provided today: NA  Recommended Physical Activity Goals  Lela has been advised to work up to 150 minutes of moderate intensity aerobic activity a week and strengthening exercises 2-3 times per week for cardiovascular health, weight loss maintenance and preservation of muscle mass.   He has agreed to Continue current level of physical activity  and Think about ways to increase daily physical activity and overcoming barriers to exercise    ASSOCIATED CONDITIONS ADDRESSED TODAY  Action/Plan  Hypertensive heart disease with heart failure (HCC) Continue to follow up with cardiology, PCP and nephrology. Continue meds as directed  Hyperlipidemia associated with type 2 diabetes mellitus (HCC) Continue to follow up with cardiology and PCP. Continue meds as directed  ASCVD reviewed with patient.  Discussed the importance of good control of BP, DMT2 and HLD    Diabetes mellitus with stage 4 chronic kidney disease (HCC) Continue to follow up with cardiology, PCP and nephrology. Continue meds as directed  Morbid obesity (HCC)  BMI 32.0-32.9,adult     Labs reviewed in chart with patient from 01/22/23.  All questions answered.    Return in about 3 weeks (around 02/25/2023).Marland Kitchen He was informed of the importance of frequent follow up visits to maximize his success with intensive lifestyle modifications for his multiple health conditions.   ATTESTASTION STATEMENTS:  Reviewed by clinician on day of visit: allergies, medications, problem list,  medical history, surgical history, family history, social history, and previous encounter notes.   Time spent on visit including pre-visit chart review and post-visit care and charting was 40 minutes.    Theodis Sato. Bowie Delia FNP-C

## 2023-02-05 ENCOUNTER — Ambulatory Visit: Payer: 59 | Admitting: Nurse Practitioner

## 2023-02-25 ENCOUNTER — Ambulatory Visit: Payer: 59 | Admitting: Internal Medicine

## 2023-02-25 ENCOUNTER — Encounter: Payer: Self-pay | Admitting: Internal Medicine

## 2023-02-25 VITALS — BP 154/78 | HR 81 | Ht 75.0 in | Wt 269.0 lb

## 2023-02-25 DIAGNOSIS — E785 Hyperlipidemia, unspecified: Secondary | ICD-10-CM | POA: Diagnosis not present

## 2023-02-25 DIAGNOSIS — N184 Chronic kidney disease, stage 4 (severe): Secondary | ICD-10-CM

## 2023-02-25 DIAGNOSIS — Z7985 Long-term (current) use of injectable non-insulin antidiabetic drugs: Secondary | ICD-10-CM

## 2023-02-25 DIAGNOSIS — E119 Type 2 diabetes mellitus without complications: Secondary | ICD-10-CM | POA: Diagnosis not present

## 2023-02-25 DIAGNOSIS — E1122 Type 2 diabetes mellitus with diabetic chronic kidney disease: Secondary | ICD-10-CM | POA: Diagnosis not present

## 2023-02-25 DIAGNOSIS — Z794 Long term (current) use of insulin: Secondary | ICD-10-CM

## 2023-02-25 DIAGNOSIS — E669 Obesity, unspecified: Secondary | ICD-10-CM

## 2023-02-25 NOTE — Patient Instructions (Addendum)
Please continue: - Tresiba U200 60 units at bedtime - Ozempic 0.5 mg weekly.  Please discuss with nephrology if we can use: [place] or [place].  Please return in 4 months.

## 2023-02-25 NOTE — Progress Notes (Signed)
Patient ID: Alexander [name], male   DOB: 1970-11-11, 52 y.o.   MRN: [id number]  HPI: Alexander [name] is a 52 y.o.-year-old male, returning for follow-up for DM2, dx in 2004, insulin-dependent since 2013, uncontrolled, with complications (CHF, CKD, PN, DR). Pt. previously saw Dr. Everardo All, last visit with me 4 months ago.  Interim history: No increased urination, blurry vision, chest pain.  He does have acid reflux >> improved. He has back pain and had to have steroids (p.o. and injections) for this.  However, no steroid courses since last visit. He just started to [facility] [employer]. Hs wife also goes there.  Reviewed HbA1c: Lab Results  Component Value Date   HGBA1C 7.1 (H) 01/22/2023   HGBA1C 8.3 (A) 10/22/2022   HGBA1C 8.6 (A) 06/18/2022   HGBA1C 7.5 (A) 03/14/2022   HGBA1C 7.9 (A) 09/05/2021   HGBA1C 7.2 (A) 06/06/2021   HGBA1C 7.2 (A) 02/20/2021   HGBA1C 6.6 (A) 03/08/2020   HGBA1C 7.9 (A) 12/07/2019   HGBA1C 7.9 (A) 09/07/2019   He is on: - Tresiba U200 60 units at bedtime - FiAsp 8-10 units before the 3 meals >> off since starting Ozempic (ran out) - Ozempic 0.25 >> 0.5 mg weekly-started 10/2022 He was previously on metformin, Levemir, Trulicity.  Pt checks his sugars 1-2x a day and they are: - am: 130-180 >> 114-168, 181 >> 76-190, 251 >> 95-120 - 2h after b'fast: n/c - before lunch: n/c >> 132 >> 79, 121, 280 >> 100-150 - 2h after lunch: n/c - before dinner: n/c - 2h after dinner: n/c - bedtime: 130-180 >> 151-203, 228 >> 159, 214-323 >> 130s - nighttime: n/c Lowest sugar was 80 >> 130 >> 89 >> 79 >> 95; he has hypoglycemia awareness at 80.  Highest sugar was 240 >> 185 >> 587 >> 378 >> 200.  Glucometer: Freestyle lite  Pt's meals are: - Breakfast: protein shake - Lunch: largest meal: chicken tenders or salad - Dinner: pasta, chicken fried or baked - Snacks: multiple - fruit He has an air fryer.  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, last  BUN/creatinine:  Lab Results  Component Value Date   BUN 36 (H) 01/22/2023   BUN 38 (H) 12/05/2022   CREATININE 2.78 (H) 01/22/2023   CREATININE 2.81 (H) 12/05/2022   He has proteinuria: Component 11/26/21 05/17/20 11/15/19 06/02/17 11/14/16  U Interval Random Random Random Random Random  U Volume 10 30 3 10 10   U Protein Concentrate 60 10 31 12 14   U Creatinine Concentrate 202 84 139 139 190  U Protein/G Creatinine 0.00 - 200.00 MG/G CREATININE 297.03 High  119.05 223.02 High  86.33 73.68   -+ HL; last set of lipids: Lab Results  Component Value Date   CHOL 155 01/22/2023   HDL 45 01/22/2023   LDLCALC 96 01/22/2023   LDLDIRECT 164.0 07/15/2014   TRIG 74 01/22/2023   CHOLHDL 4 12/05/2022  On Lipitor 40 mg daily.  - last eye exam was on 07/29/2022. No DR reportedly.   - + numbness and tingling in his feet (L>R - 2/2 sciatica - improved after steroid inj.).  Last foot exam 03/14/2022.  Patient also has a history of HTN.  ROS: + see HPI  Past Medical History:  Diagnosis Date   CHEST PAIN 08/28/2009   DIABETES MELLITUS, TYPE II 03/20/2007   HYPERCHOLESTEROLEMIA 07/21/2008   HYPERTENSION 03/20/2007   Hypertensive urgency 03/27/2015   Left ventricular dysfunction 03/28/2015  EF 40-45%, no WMA, grade 2 diastolic dysfunction   Renal disorder    Past Surgical History:  Procedure Laterality Date   KNEE ARTHROSCOPY Right    Social History   Socioeconomic History   Marital status: Married    Spouse name: Not on file   Number of children: Not on file   Years of education: Not on file   Highest education level: Not on file  Occupational History   Occupation: English as a second language teacher: [employer]  Tobacco Use   Smoking status: Never   Smokeless tobacco: Never  Vaping Use   Vaping status: Never Used  Substance and Sexual Activity   Alcohol use: No   Drug use: No   Sexual activity: Not on file  Other Topics Concern   Not on file  Social History Narrative   Not on file    Social Determinants of Health   Financial Resource Strain: Not on file  Food Insecurity: Not on file  Transportation Needs: Not on file  Physical Activity: Not on file  Stress: Not on file  Social Connections: Not on file  Intimate Partner Violence: Not on file   Current Outpatient Medications on File Prior to Visit  Medication Sig Dispense Refill   aspirin EC 81 MG EC tablet Take 1 tablet (81 mg total) by mouth daily.     atorvastatin (LIPITOR) 40 MG tablet Take 1 tablet (40 mg total) by mouth daily. 90 tablet 2   carvedilol (COREG) 12.5 MG tablet TAKE 1 TABLET (12.5MG  TOTAL) BY MOUTH TWICE A DAY WITH MEALS 180 tablet 1   furosemide (LASIX) 20 MG tablet TAKE 1 TABLET (20 MG TOTAL) BY MOUTH DAILY AS NEEDED (FOR SHORTNESS OF BREATH AND SWELLING.). 30 tablet 0   glucose blood (FREESTYLE LITE) test strip 1 each by Other route 2 (two) times daily. And lancets 2/day 200 each 3   hydrALAZINE (APRESOLINE) 50 MG tablet Take 1 tablet by mouth 2 (two) times daily.     insulin degludec (TRESIBA FLEXTOUCH) 200 UNIT/ML FlexTouch Pen Inject 60 units under skin daily 18 mL 3   Insulin Pen Needle (BD PEN NEEDLE NANO U/F) 32G X 4 MM MISC USE 4X DAILY AS DIRECTED 400 each 3   Lancets (FREESTYLE) lancets Use to monitor glucose levels BID; E11.22 100 each 12   nitroGLYCERIN (NITROSTAT) 0.4 MG SL tablet Place 1 tablet (0.4 mg total) under the tongue every 5 (five) minutes x 3 doses as needed for chest pain. Don't take within 24 hrs of Cialis 25 tablet 0   Semaglutide,0.25 or 0.5MG /DOS, 2 MG/3ML SOPN Inject 0.5 mg into the skin once a week. 9 mL 3   tiZANidine (ZANAFLEX) 4 MG tablet Take 1 tablet (4 mg total) by mouth every 6 (six) hours as needed for muscle spasms. 30 tablet 0   Vitamin D, Ergocalciferol, (DRISDOL) 1.25 MG (50000 UNIT) CAPS capsule Take 1 capsule (50,000 Units total) by mouth every Monday. 5 capsule 2   No current facility-administered medications on file prior to visit.   No Known  Allergies Family History  Problem Relation Age of Onset   Diabetes Mother    Diabetes Father    Hypertension Father    Diabetes Maternal Grandfather    Diabetes Paternal Grandfather    Diabetes Brother    Hypertension Brother    PE: BP (!) 154/78   Pulse 81   Ht 6\' 3"  (1.905 m)   Wt 269 lb (122 kg)   SpO2 98%  BMI 33.62 kg/m  Wt Readings from Last 3 Encounters:  02/25/23 269 lb (122 kg)  02/04/23 263 lb (119.3 kg)  01/22/23 266 lb (120.7 kg)   Constitutional: overweight, in NAD Eyes:  EOMI, no exophthalmos ENT: no neck masses, no cervical lymphadenopathy Cardiovascular: RRR, No MRG Respiratory: CTA B Musculoskeletal: no deformities Skin:no rashes Neurological: no tremor with outstretched hands Diabetic Foot Exam - Simple   Simple Foot Form Diabetic Foot exam was performed with the following findings: Yes 02/25/2023  3:41 PM  Visual Inspection No deformities, no ulcerations, no other skin breakdown bilaterally: Yes Sensation Testing Intact to touch and monofilament testing bilaterally: Yes Pulse Check Posterior Tibialis and Dorsalis pulse intact bilaterally: Yes Comments    ASSESSMENT: 1. 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 HL  3.  Obesity class I  PLAN:  1. Patient with longstanding, uncontrolled, type 2 diabetes, on injectable antidiabetic regimen with basal insulin and also weekly GLP-1 receptor agonist, with improved control.  At last visit, HbA1c improved to 8.3% but he had another HbA1c obtained this month and this was much better, at 7.1% after addition of Ozempic at last visit.  He was previously on Trulicity but was not taking it at last visit.  At that time, sugars were quite fluctuating, from the 70s to the 300s.  Sugars are high at night but he occasionally had values in the 200s in the morning.  Besides adding Ozempic I also recommended to discuss with nephrology we could  use an SGLT2 inhibitor. -At today's visit, sugars have improved significantly and they are mostly at goal.  This is despite him stopping Fiasp after he ran out around the time we started Ozempic.  However, he saw that the sugars were improved and he did not refill the prescriptions.  I agree that he does not need rapid acting insulin for now.  I would still prefer to start an SGLT2 inhibitor for him, we will try to do so at next visit and we may be able to decrease the dose of Tresiba at the same time.  For now, we will continue the same dose of Ozempic but at next visit we may also increase the dose of the GLP-1 receptor agonist to help not only with diabetes control but also weight loss. - I suggested to:  Patient Instructions  Please continue: - Tresiba U200 60 units at bedtime - Ozempic 0.5 mg weekly.  Please discuss with nephrology if we can use: [place] or [place].  Please return in 4 months.  - advised to check sugars at different times of the day - 4x a day, rotating check times - advised for yearly eye exams >> he is UTD - return to clinic in 4 months  2. HL -Reviewed latest lipid panel from 01/2023: LDL above our target of less than 55 but otherwise fractions at goal: Lab Results  Component Value Date   CHOL 155 01/22/2023   HDL 45 01/22/2023   LDLCALC 96 01/22/2023   LDLDIRECT 164.0 07/15/2014   TRIG 74 01/22/2023   CHOLHDL 4 12/05/2022  -He continues Lipitor 40 mg daily without side effects  3.  Obesity class I -he gained 24 pounds before last visit so I suggested addition of Ozempic.   -He lost 7 pounds since last visit. -He is [facility] so weight lose more weight loss before next visit.  Alexander Pavlov,  MD PhD [facility]

## 2023-03-05 ENCOUNTER — Encounter: Payer: Self-pay | Admitting: Nurse Practitioner

## 2023-03-05 ENCOUNTER — Ambulatory Visit: Payer: 59 | Admitting: Nurse Practitioner

## 2023-03-05 VITALS — BP 144/90 | HR 85 | Temp 98.8°F | Ht 75.0 in | Wt 261.0 lb

## 2023-03-05 DIAGNOSIS — E1169 Type 2 diabetes mellitus with other specified complication: Secondary | ICD-10-CM | POA: Diagnosis not present

## 2023-03-05 DIAGNOSIS — I10 Essential (primary) hypertension: Secondary | ICD-10-CM | POA: Diagnosis not present

## 2023-03-05 DIAGNOSIS — Z6836 Body mass index (BMI) 36.0-36.9, adult: Secondary | ICD-10-CM

## 2023-03-05 DIAGNOSIS — N184 Chronic kidney disease, stage 4 (severe): Secondary | ICD-10-CM

## 2023-03-05 DIAGNOSIS — Z6832 Body mass index (BMI) 32.0-32.9, adult: Secondary | ICD-10-CM

## 2023-03-05 DIAGNOSIS — Z7984 Long term (current) use of oral hypoglycemic drugs: Secondary | ICD-10-CM

## 2023-03-05 DIAGNOSIS — E785 Hyperlipidemia, unspecified: Secondary | ICD-10-CM

## 2023-03-05 DIAGNOSIS — Z7985 Long-term (current) use of injectable non-insulin antidiabetic drugs: Secondary | ICD-10-CM

## 2023-03-05 DIAGNOSIS — E1122 Type 2 diabetes mellitus with diabetic chronic kidney disease: Secondary | ICD-10-CM

## 2023-03-05 NOTE — Progress Notes (Signed)
Office: [phone]  /  Fax: [phone]  WEIGHT SUMMARY AND BIOMETRICS  Weight Lost Since Last Visit: 2lb  Weight Gained Since Last Visit: 0lb   Vitals Temp: 98.8 F (37.1 C) BP: (!) 144/90 Pulse Rate: 85 SpO2: 98 %   Anthropometric Measurements Height: 6\' 3"  (1.905 m) Weight: 261 lb (118.4 kg) BMI (Calculated): 32.62 Weight at Last Visit: 263lb Weight Lost Since Last Visit: 2lb Weight Gained Since Last Visit: 0lb Starting Weight: 266lb Total Weight Loss (lbs): 5 lb (2.268 kg)   Body Composition  Body Fat %: 34.9 % Fat Mass (lbs): 91.2 lbs Muscle Mass (lbs): 162 lbs Total Body Water (lbs): 118.2 lbs Visceral Fat Rating : 17   Other Clinical Data Fasting: No Labs: No Today's Visit #: 3 Starting Date: 01/22/23     HPI  Chief Complaint: OBESITY  Dearies is here to discuss his progress with his obesity treatment plan. He is on the the Category 2 Plan and states he is following his eating plan approximately 50 % of the time. He states he is exercising 15-20 minutes 2 days per week-walking.   Interval History:  Since last office visit he has lost 2 pounds.  He is overall trying to eat better.  He is focusing more on eating-nutrition and not on exercising currently. He is drinking water daily and occ soda.  He's trying to limit sugary drinks.     Pharmacotherapy for weight loss: He is not currently taking medications  for medical weight loss.    Previous pharmacotherapy for medical weight loss:  none  Bariatric surgery:  Patient has not had bariatric surgery.   Hypertension Hypertension needs improvement.  Medication(s): Coreg 12.5 mg twice daily, Lasix 20 mg as needed,Apresoline 50 mg twice daily (reports taking medications prior to visit) Denies chest pain, palpitations.  Reports some SHOB with sitting and with getting up from a sitting position.  He is walking 2 days per week and notes some SHOB with exertion.   Has not had to take nitroglycerin.    He saw cardiology last on 12/19/21. Last echo was 07/13/15. Last EKG was 01/22/23.  BP at home 130-150/80-90  BP Readings from Last 3 Encounters:  03/05/23 (!) 144/90  02/25/23 (!) 154/78  02/04/23 (!) 147/84   Lab Results  Component Value Date   CREATININE 2.78 (H) 01/22/2023   CREATININE 2.81 (H) 12/05/2022   CREATININE 2.69 (H) 06/06/2022    Pharmacotherapy for DMT2:  He is currently taking Tresiba 60 units daily, Ozempic 0.5 mg.  Denies side effects.   He saw endocrinology last on 02/25/2023 and has follow-up appointment scheduled on 06/27/2023.  He is seeing nephrology on 06/25/2023.  Endocrinology has asked him to discuss using either [place] or [place].  He is to check his blood sugars 4 times a day.  She will consider increasing his Ozempic at his next visit.   Last A1c was 7.1 CBGs: Fasting 93-115 Episodes of hypoglycemia: no On statin, ASA 81mg .  ACE stopped due to elevated creat  Lab Results  Component Value Date   HGBA1C 7.1 (H) 01/22/2023   HGBA1C 8.3 (A) 10/22/2022   HGBA1C 8.6 (A) 06/18/2022   Lab Results  Component Value Date   MICROALBUR 32.0 (H) 12/05/2022   LDLCALC 96 01/22/2023   CREATININE 2.78 (H) 01/22/2023     Hyperlipidemia Medication(s): Lipitor 40mg  . Denies side effects.   Cardiovascular risk factors: diabetes mellitus, dyslipidemia, hypertension, male gender, and obesity (BMI >= 30 kg/m2)  Lab Results  Component Value Date   CHOL 155 01/22/2023   HDL 45 01/22/2023   LDLCALC 96 01/22/2023   LDLDIRECT 164.0 07/15/2014   TRIG 74 01/22/2023   CHOLHDL 4 12/05/2022   Lab Results  Component Value Date   ALT 27 01/22/2023   AST 25 01/22/2023   ALKPHOS 81 01/22/2023   BILITOT 0.5 01/22/2023   The 10-year ASCVD risk score (Arnett DK, et al., 2019) is: 19.8%   Values used to calculate the score:     Age: 52 years     Sex: Male     Is Non-Hispanic African American: Yes     Diabetic: Yes     Tobacco smoker: No     Systolic Blood Pressure: 144  mmHg     Is BP treated: Yes     HDL Cholesterol: 45 mg/dL     Total Cholesterol: 155 mg/dL   PHYSICAL EXAM:  Blood pressure (!) 144/90, pulse 85, temperature 98.8 F (37.1 C), height 6\' 3"  (1.905 m), weight 261 lb (118.4 kg), SpO2 98%. Body mass index is 32.62 kg/m.  General: He is overweight, cooperative, alert, well developed, and in no acute distress. PSYCH: Has normal mood, affect and thought process.   Extremities: No edema.  Neurologic: No gross sensory or motor deficits. No tremors or fasciculations noted.    DIAGNOSTIC DATA REVIEWED:  BMET    Component Value Date/Time   NA 142 01/22/2023 0953   K 4.6 01/22/2023 0953   CL 104 01/22/2023 0953   CO2 23 01/22/2023 0953   GLUCOSE 118 (H) 01/22/2023 0953   GLUCOSE 97 12/05/2022 0928   BUN 36 (H) 01/22/2023 0953   CREATININE 2.78 (H) 01/22/2023 0953   CREATININE 2.04 (H) 04/07/2015 1641   CALCIUM 9.3 01/22/2023 0953   GFRNONAA 33 (L) 04/05/2017 0537   GFRAA 38 (L) 04/05/2017 0537   Lab Results  Component Value Date   HGBA1C 7.1 (H) 01/22/2023   HGBA1C 12.1 (H) 10/27/2006   Lab Results  Component Value Date   INSULIN 5.2 01/22/2023   Lab Results  Component Value Date   TSH 1.210 01/22/2023   CBC    Component Value Date/Time   WBC 8.1 01/22/2023 0953   WBC 9.4 11/30/2021 1151   RBC 4.69 01/22/2023 0953   RBC 4.73 11/30/2021 1151   HGB 12.4 (L) 01/22/2023 0953   HCT 37.8 01/22/2023 0953   PLT 279 01/22/2023 0953   MCV 81 01/22/2023 0953   MCH 26.4 (L) 01/22/2023 0953   MCH 26.8 04/05/2017 0537   MCHC 32.8 01/22/2023 0953   MCHC 33.1 11/30/2021 1151   RDW 12.8 01/22/2023 0953   Iron Studies No results found for: "IRON", "TIBC", "FERRITIN", "IRONPCTSAT" Lipid Panel     Component Value Date/Time   CHOL 155 01/22/2023 0953   TRIG 74 01/22/2023 0953   HDL 45 01/22/2023 0953   CHOLHDL 4 12/05/2022 0928   VLDL 19.6 12/05/2022 0928   LDLCALC 96 01/22/2023 0953   LDLDIRECT 164.0 07/15/2014 1703    Hepatic Function Panel     Component Value Date/Time   PROT 7.1 01/22/2023 0953   ALBUMIN 4.1 01/22/2023 0953   AST 25 01/22/2023 0953   ALT 27 01/22/2023 0953   ALKPHOS 81 01/22/2023 0953   BILITOT 0.5 01/22/2023 0953   BILIDIR 0.1 01/08/2011 1012      Component Value Date/Time   TSH 1.210 01/22/2023 0953   Nutritional Lab Results  Component Value Date   VD25OH 52.6 01/22/2023  VD25OH 52.43 11/30/2021     ASSESSMENT AND PLAN  TREATMENT PLAN FOR OBESITY:  Recommended Dietary Goals  Anthoy is currently in the action stage of change. As such, his goal is to continue weight management plan. He has agreed to the Category 2 Plan.  Behavioral Intervention  We discussed the following Behavioral Modification Strategies today: increasing lean protein intake, decreasing simple carbohydrates , increasing vegetables, increasing lower glycemic fruits, increasing water intake, work on meal planning and preparation, reading food labels , continue to practice mindfulness when eating, and planning for success.  Additional resources provided today: NA  Recommended Physical Activity Goals  Cassel has been advised to work up to 150 minutes of moderate intensity aerobic activity a week and strengthening exercises 2-3 times per week for cardiovascular health, weight loss maintenance and preservation of muscle mass.   He has agreed to Think about ways to increase daily physical activity and overcoming barriers to exercise and Increase physical activity in their day and reduce sedentary time (increase NEAT).    ASSOCIATED CONDITIONS ADDRESSED TODAY  Action/Plan  Essential hypertension Due to patient's current complaints of shortness of breath, we contacted his cardiology's office and was able to get him worked in tomorrow morning.  In the meantime he is to go to the ER with any chest pains or with worsening shortness of breath.  Patient verbalizes understanding.  Continue medications  as directed and please keep appoint with cardiology tomorrow.  Diabetes mellitus with stage 4 chronic kidney disease (HCC) Continue to follow-up with endocrinology and nephrology.  Continue medications as directed.  Hyperlipidemia associated with type 2 diabetes mellitus (HCC) Continue to follow-up with nephrology, cardiology and endocrinology.  Continue medications as directed.  Morbid obesity (HCC)  BMI 32.0-32.9,adult         Return in about 3 weeks (around 03/26/2023).Marland Kitchen He was informed of the importance of frequent follow up visits to maximize his success with intensive lifestyle modifications for his multiple health conditions.   ATTESTASTION STATEMENTS:  Reviewed by clinician on day of visit: allergies, medications, problem list, medical history, surgical history, family history, social history, and previous encounter notes.     Theodis Sato. Kamry Faraci FNP-C

## 2023-03-05 NOTE — Progress Notes (Unsigned)
Cardiology Clinic Note   Patient Name: Alexander [name] Date of Encounter: 03/06/2023  Primary Care Provider:  Myrlene Broker, MD Primary Cardiologist:  Chrystie Nose, MD  Patient Profile    52 year old male with history of labile hypertension, prior history of HFrEF with improvement in LVEF to normal, hypertensive heart disease, hyperlipidemia, hypertensive nephropathy with chronic kidney disease stage III, IDDM, and ectopic atrial rhythm.  Last seen by Dr. Rennis Golden 1 year ago on 12/19/2021.  At that time he was doing well without any cardiac symptoms.  He was to remain on high intensity statin.  Past Medical History    Past Medical History:  Diagnosis Date   CHEST PAIN 08/28/2009   DIABETES MELLITUS, TYPE II 03/20/2007   HYPERCHOLESTEROLEMIA 07/21/2008   HYPERTENSION 03/20/2007   Hypertensive urgency 03/27/2015   Left ventricular dysfunction 03/28/2015   EF 40-45%, no WMA, grade 2 diastolic dysfunction   Renal disorder    Past Surgical History:  Procedure Laterality Date   KNEE ARTHROSCOPY Right     Allergies  No Known Allergies  History of Present Illness    Alexander [name] returns to the office today for ongoing assessment and management of hypertension, hyperlipidemia, hypertensive heart disease, prior history of systolic heart failure with resolution of EF to normal.  When seen last 1 year ago he was to continue current medication regimen as he was without any cardiac complaints at that time.  He is currently a part of [facility].  He has concerns today about elevated blood pressure that was noted and last office visit with healthy weight and wellness.  Initially it was 170/90, with a repeat blood pressure 144/90.  The patient had taken his antihypertensives that day.  He admits to being in a hurry getting to the office visit.  At home he states blood pressure fluctuates.  Usually takes his blood pressure in the morning before taking his  medications.  He states that it runs up to 140 systolic or above.  He is being mindful of salt, he is trying to be more active.  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  He also states that his wife is noted that he has been snoring at home, he also feels tired and sleepy during the day.  He has not been checked for sleep apnea.  He also has complaints of GERD symptoms for which she takes Maalox on occasion.  He states he is in a high pressure job.  He does not drink caffeine or carbonated beverages.  Mostly juices.  Cholesterol status, kidney function, and diabetes management is through nephrology, endocrinology and primary care with labs throughout the year.  Home Medications    Current Outpatient Medications  Medication Sig Dispense Refill   aspirin EC 81 MG EC tablet Take 1 tablet (81 mg total) by mouth daily.     atorvastatin (LIPITOR) 40 MG tablet Take 1 tablet (40 mg total) by mouth daily. 90 tablet 2   carvedilol (COREG) 12.5 MG tablet TAKE 1 TABLET (12.5MG  TOTAL) BY MOUTH TWICE A DAY WITH MEALS 180 tablet 1   furosemide (LASIX) 20 MG tablet TAKE 1 TABLET (20 MG TOTAL) BY MOUTH DAILY AS NEEDED (FOR SHORTNESS OF BREATH AND SWELLING.). 30 tablet 0   glucose blood (FREESTYLE LITE) test strip 1 each by Other route 2 (two) times daily. And lancets 2/day 200 each 3   hydrALAZINE (APRESOLINE)  50 MG tablet Take 1 tablet by mouth 2 (two) times daily.     insulin degludec (TRESIBA FLEXTOUCH) 200 UNIT/ML FlexTouch Pen Inject 60 units under skin daily 18 mL 3   Insulin Pen Needle (BD PEN NEEDLE NANO U/F) 32G X 4 MM MISC USE 4X DAILY AS DIRECTED 400 each 3   Lancets (FREESTYLE) lancets Use to monitor glucose levels BID; E11.22 100 each 12   nitroGLYCERIN (NITROSTAT) 0.4 MG SL tablet Place 1 tablet (0.4 mg total) under the tongue every 5 (five) minutes x 3 doses as needed for chest pain. Don't take within 24 hrs of Cialis 25 tablet 0    Semaglutide,0.25 or 0.5MG /DOS, 2 MG/3ML SOPN Inject 0.5 mg into the skin once a week. 9 mL 3   tiZANidine (ZANAFLEX) 4 MG tablet Take 1 tablet (4 mg total) by mouth every 6 (six) hours as needed for muscle spasms. 30 tablet 0   Vitamin D, Ergocalciferol, (DRISDOL) 1.25 MG (50000 UNIT) CAPS capsule Take 1 capsule (50,000 Units total) by mouth every Monday. 5 capsule 2   No current facility-administered medications for this visit.     Family History    Family History  Problem Relation Age of Onset   Diabetes Mother    Diabetes Father    Hypertension Father    Diabetes Maternal Grandfather    Diabetes Paternal Grandfather    Diabetes Brother    Hypertension Brother    He indicated that his mother is alive. He indicated that his father is alive. He indicated that the status of his brother is unknown. He indicated that his maternal grandmother is alive. He indicated that his maternal grandfather is deceased. He indicated that his paternal grandmother is deceased. He indicated that his paternal grandfather is deceased.  Social History    Social History   Socioeconomic History   Marital status: Married    Spouse name: Not on file   Number of children: Not on file   Years of education: Not on file   Highest education level: Not on file  Occupational History   Occupation: English as a second language teacher: [employer]  Tobacco Use   Smoking status: Never   Smokeless tobacco: Never  Vaping Use   Vaping status: Never Used  Substance and Sexual Activity   Alcohol use: No   Drug use: No   Sexual activity: Not on file  Other Topics Concern   Not on file  Social History Narrative   Not on file   Social Determinants of Health   Financial Resource Strain: Not on file  Food Insecurity: Not on file  Transportation Needs: Not on file  Physical Activity: Not on file  Stress: Not on file  Social Connections: Not on file  Intimate Partner Violence: Not on file     Review of Systems    General:   No chills, fever, night sweats or weight changes.  Cardiovascular:  No chest pain, dyspnea on exertion, edema, orthopnea, palpitations, paroxysmal nocturnal dyspnea. Dermatological: No rash, lesions/masses Respiratory: No cough, dyspnea Urologic: No hematuria, dysuria Abdominal:   No nausea, vomiting, diarrhea, bright red blood per rectum, melena, or hematemesis Neurologic:  No visual changes, wkns, changes in mental status. All other systems reviewed and are otherwise negative except as noted above.       Physical Exam    VS:  BP 122/70   Pulse 95   Ht 6\' 3"  (1.905 m)   Wt 268 lb (121.6 kg)   SpO2 96%  BMI 33.50 kg/m  , BMI Body mass index is 33.5 kg/m.     GEN: Well nourished, well developed, in no acute distress. HEENT: normal.  Wears glasses Neck: Supple, no JVD, carotid bruits, or masses. Cardiac: RRR, no murmurs, rubs, or gallops. No clubbing, cyanosis, edema.  Radials/DP/PT 2+ and equal bilaterally.  Respiratory:  Respirations regular and unlabored, clear to auscultation bilaterally. GI: Soft, nontender, nondistended, BS + x 4. MS: no deformity or atrophy. Skin: warm and dry, no rash. Neuro:  Strength and sensation are intact. Psych: Normal affect.      Lab Results  Component Value Date   WBC 8.1 01/22/2023   HGB 12.4 (L) 01/22/2023   HCT 37.8 01/22/2023   MCV 81 01/22/2023   PLT 279 01/22/2023   Lab Results  Component Value Date   CREATININE 2.78 (H) 01/22/2023   BUN 36 (H) 01/22/2023   NA 142 01/22/2023   K 4.6 01/22/2023   CL 104 01/22/2023   CO2 23 01/22/2023   Lab Results  Component Value Date   ALT 27 01/22/2023   AST 25 01/22/2023   ALKPHOS 81 01/22/2023   BILITOT 0.5 01/22/2023   Lab Results  Component Value Date   CHOL 155 01/22/2023   HDL 45 01/22/2023   LDLCALC 96 01/22/2023   LDLDIRECT 164.0 07/15/2014   TRIG 74 01/22/2023   CHOLHDL 4 12/05/2022    Lab Results  Component Value Date   HGBA1C 7.1 (H) 01/22/2023     Review of  Prior Studies Echocardiogram 07/13/2015   Left ventricle: The cavity size was normal. There was mild    concentric hypertrophy. Systolic function was normal. The    estimated ejection fraction was in the range of 55% to 60%. Wall    motion was normal; there were no regional wall motion    abnormalities. Doppler parameters are consistent with abnormal    left ventricular relaxation (grade 1 diastolic dysfunction).  - Left atrium: The atrium was mildly dilated.    Assessment & Plan   1.  Hypertension: Today blood pressure is very well-controlled.  I have seen elevation at recent healthy weight and wellness visit.  At home he states it is labile.  Usually takes it before he takes his medication.  I am giving him a blood pressure recording sheet.  I will ask him to take his blood pressure at the same time every day, seated for 5 minutes.  Making sure he is not had his medications prior to doing so.  I will continue him on carvedilol 12.5 mg twice a day with meals, and hydralazine 50 mg twice a day.  He uses furosemide 20 mg as needed for swelling and shortness of breath.  He is given a "salty 6" information sheet as well.  Will see him back in 1 month to reevaluate his medication regimen and compare blood pressure readings at home.  2.  GERD symptoms: I have recommended over-the-counter ranitidine daily.  If symptoms worsen he is to follow-up with PCP who may decide to do a PPI, or referral to GI.  3.  Hypercholesterolemia: On atorvastatin 40 mg daily.  Most recent lipid panel on 12/05/2022 cholesterol 159; HDL 41; LDL 98.  Continue current medication regimen.  4.  Snoring: Consideration for home sleep study to evaluate for OSA in setting of snoring and daytime sleepiness.  He would like to hold off on this as he is trying to lose weight and be more active.  If this continues  to be an issue for him we can consider this on follow-up visits or he can discuss with PCP.         Signed, Bettey Mare.  Liborio Nixon, ANP, AACC   03/06/2023 8:53 AM      Office [phone] Fax [phone]  Notice: This dictation was prepared with Dragon dictation along with smaller phrase technology. Any transcriptional errors that result from this process are unintentional and may not be corrected upon review.

## 2023-03-06 ENCOUNTER — Ambulatory Visit: Payer: 59 | Attending: Adult Health | Admitting: Adult Health

## 2023-03-06 ENCOUNTER — Encounter: Payer: Self-pay | Admitting: Adult Health

## 2023-03-06 VITALS — BP 122/70 | HR 95 | Ht 75.0 in | Wt 268.0 lb

## 2023-03-06 DIAGNOSIS — K219 Gastro-esophageal reflux disease without esophagitis: Secondary | ICD-10-CM

## 2023-03-06 DIAGNOSIS — R5383 Other fatigue: Secondary | ICD-10-CM | POA: Diagnosis not present

## 2023-03-06 DIAGNOSIS — I10 Essential (primary) hypertension: Secondary | ICD-10-CM | POA: Diagnosis not present

## 2023-03-06 DIAGNOSIS — E7800 Pure hypercholesterolemia, unspecified: Secondary | ICD-10-CM

## 2023-03-06 NOTE — Patient Instructions (Signed)
Medication Instructions:  Your physician recommends that you continue on your current medications as directed. Please refer to the Current Medication list given to you today. *If you need a refill on your cardiac medications before your next appointment, please call your pharmacy*   Lab Work: No Lab work If you have labs (blood work) drawn today and your tests are completely normal, you will receive your results only by: MyChart Message (if you have MyChart) OR A paper copy in the mail If you have any lab test that is abnormal or we need to change your treatment, we will call you to review the results.   Testing/Procedures: No Testing   Follow-Up: At [facility], you and your health needs are our priority.  As part of our continuing mission to provide you with exceptional heart care, we have created designated Provider Care Teams.  These Care Teams include your primary Cardiologist (physician) and Advanced Practice Providers (APPs -  Physician Assistants and Nurse Practitioners) who all work together to provide you with the care you need, when you need it.  We recommend signing up for the patient portal called "MyChart".  Sign up information is provided on this After Visit Summary.  MyChart is used to connect with patients for Virtual Visits (Telemedicine).  Patients are able to view lab/test results, encounter notes, upcoming appointments, etc.  Non-urgent messages can be sent to your provider as well.   To learn more about what you can do with MyChart, go to [email or web address].    Your next appointment:   1 month(s)  Provider:   Joni Reining, DNP, ANP  Other Instructions Monitor blood pressure daily. Recommend taking pepcid (Over the counter) daily

## 2023-03-19 NOTE — Progress Notes (Deleted)
Cardiology Clinic Note   Patient Name: Alexander [name] Date of Encounter: 03/19/2023  Primary Care Provider:  Myrlene Broker, MD Primary Cardiologist:  Chrystie Nose, MD  Patient Profile    52 year old male with history of labile hypertension, prior history of HFrEF with improvement in LVEF to normal, hypertensive heart disease, hyperlipidemia, hypertensive nephropathy with chronic kidney disease stage III, IDDM, and ectopic atrial rhythm.    On last office visit with me dated 03/06/2023 he was to continue his medication regimen of carvedilol 12.5 mg twice daily and hydralazine 50 mg twice a day along with furosemide 20 mg daily as needed for swelling and shortness of breath.  He was to be considered for home sleep study for evaluation of OSA in setting of snoring and daytime somnolence.  We held off on this as he was trying to lose weight become more active.  Past Medical History    Past Medical History:  Diagnosis Date   CHEST PAIN 08/28/2009   DIABETES MELLITUS, TYPE II 03/20/2007   HYPERCHOLESTEROLEMIA 07/21/2008   HYPERTENSION 03/20/2007   Hypertensive urgency 03/27/2015   Left ventricular dysfunction 03/28/2015   EF 40-45%, no WMA, grade 2 diastolic dysfunction   Renal disorder    Past Surgical History:  Procedure Laterality Date   KNEE ARTHROSCOPY Right     Allergies  No Known Allergies  History of Present Illness    ***  Home Medications    Current Outpatient Medications  Medication Sig Dispense Refill   aspirin EC 81 MG EC tablet Take 1 tablet (81 mg total) by mouth daily.     atorvastatin (LIPITOR) 40 MG tablet Take 1 tablet (40 mg total) by mouth daily. 90 tablet 2   carvedilol (COREG) 12.5 MG tablet TAKE 1 TABLET (12.5MG  TOTAL) BY MOUTH TWICE A DAY WITH MEALS 180 tablet 1   furosemide (LASIX) 20 MG tablet TAKE 1 TABLET (20 MG TOTAL) BY MOUTH DAILY AS NEEDED (FOR SHORTNESS OF BREATH AND SWELLING.). 30 tablet 0   glucose blood (FREESTYLE LITE) test  strip 1 each by Other route 2 (two) times daily. And lancets 2/day 200 each 3   hydrALAZINE (APRESOLINE) 50 MG tablet Take 1 tablet by mouth 2 (two) times daily.     insulin degludec (TRESIBA FLEXTOUCH) 200 UNIT/ML FlexTouch Pen Inject 60 units under skin daily 18 mL 3   Insulin Pen Needle (BD PEN NEEDLE NANO U/F) 32G X 4 MM MISC USE 4X DAILY AS DIRECTED 400 each 3   Lancets (FREESTYLE) lancets Use to monitor glucose levels BID; E11.22 100 each 12   nitroGLYCERIN (NITROSTAT) 0.4 MG SL tablet Place 1 tablet (0.4 mg total) under the tongue every 5 (five) minutes x 3 doses as needed for chest pain. Don't take within 24 hrs of Cialis 25 tablet 0   Semaglutide,0.25 or 0.5MG /DOS, 2 MG/3ML SOPN Inject 0.5 mg into the skin once a week. 9 mL 3   tiZANidine (ZANAFLEX) 4 MG tablet Take 1 tablet (4 mg total) by mouth every 6 (six) hours as needed for muscle spasms. 30 tablet 0   Vitamin D, Ergocalciferol, (DRISDOL) 1.25 MG (50000 UNIT) CAPS capsule Take 1 capsule (50,000 Units total) by mouth every Monday. 5 capsule 2   No current facility-administered medications for this visit.     Family History    Family History  Problem Relation Age of Onset   Diabetes Mother    Diabetes Father    Hypertension Father    Diabetes Maternal Grandfather  Diabetes Paternal Grandfather    Diabetes Brother    Hypertension Brother    He indicated that his mother is alive. He indicated that his father is alive. He indicated that the status of his brother is unknown. He indicated that his maternal grandmother is alive. He indicated that his maternal grandfather is deceased. He indicated that his paternal grandmother is deceased. He indicated that his paternal grandfather is deceased.  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[employer]  Tobacco Use   Smoking status: Never   Smokeless tobacco: Never  Vaping Use   Vaping status: Never Used  Substance and Sexual Activity   Alcohol use: No   Drug use: No   Sexual activity: Not on file  Other Topics Concern   Not on file  Social History Narrative   Not on file   Social Determinants of Health   Financial Resource Strain: Not on file  Food Insecurity: Not on file  Transportation Needs: Not on file  Physical Activity: Not on file  Stress: Not on file  Social Connections: Not on file  Intimate Partner Violence: Not on file     Review of Systems    General:  No chills, fever, night sweats or weight changes.  Cardiovascular:  No chest pain, dyspnea on exertion, edema, orthopnea, palpitations, paroxysmal nocturnal dyspnea. Dermatological: No rash, lesions/masses Respiratory: No cough, dyspnea Urologic: No hematuria, dysuria Abdominal:   No nausea, vomiting, diarrhea, bright red blood per rectum, melena, or hematemesis Neurologic:  No visual changes, wkns, changes in mental status. All other systems reviewed and are otherwise negative except as noted above.       Physical Exam    VS:  There were no vitals taken for this visit. , BMI There is no height or weight on file to calculate BMI.     GEN: Well nourished, well developed, in no acute distress. HEENT: normal. Neck: Supple, no JVD, carotid bruits, or masses. Cardiac: RRR, no murmurs, rubs, or gallops. No clubbing, cyanosis, edema.  Radials/DP/PT 2+ and equal bilaterally.  Respiratory:  Respirations regular and unlabored, clear to auscultation bilaterally. GI: Soft, nontender, nondistended, BS + x 4. MS: no deformity or atrophy. Skin: warm and dry, no rash. Neuro:  Strength and sensation are intact. Psych: Normal affect.      Lab Results  Component Value Date   WBC 8.1 01/22/2023   HGB 12.4 (L) 01/22/2023   HCT 37.8 01/22/2023    MCV 81 01/22/2023   PLT 279 01/22/2023   Lab Results  Component Value Date   CREATININE 2.78 (H) 01/22/2023   BUN 36 (H) 01/22/2023   NA 142 01/22/2023   K 4.6 01/22/2023   CL 104 01/22/2023   CO2 23 01/22/2023   Lab Results  Component Value Date   ALT 27 01/22/2023   AST 25 01/22/2023   ALKPHOS 81 01/22/2023   BILITOT 0.5 01/22/2023   Lab Results  Component Value Date   CHOL 155 01/22/2023   HDL 45 01/22/2023   LDLCALC 96 01/22/2023   LDLDIRECT 164.0 07/15/2014   TRIG 74 01/22/2023   CHOLHDL 4 12/05/2022    Lab Results  Component Value Date   HGBA1C  7.1 (H) 01/22/2023     Review of Prior Studies    Echocardiogram 07/13/2015   Left ventricle: The cavity size was normal. There was mild    concentric hypertrophy. Systolic function was normal. The    estimated ejection fraction was in the range of 55% to 60%. Wall    motion was normal; there were no regional wall motion    abnormalities. Doppler parameters are consistent with abnormal    left ventricular relaxation (grade 1 diastolic dysfunction).  - Left atrium: The atrium was mildly dilated.    Assessment & Plan   1.  ***     {Are you ordering a CV Procedure (e.g. stress test, cath, DCCV, TEE, etc)?   Press F2        :[id number]}   Signed, Bettey Mare. Liborio Nixon, ANP, AACC   03/19/2023 2:16 PM      Office [phone] Fax [phone]  Notice: This dictation was prepared with Dragon dictation along with smaller phrase technology. Any transcriptional errors that result from this process are unintentional and may not be corrected upon review.

## 2023-03-20 ENCOUNTER — Telehealth: Payer: Self-pay | Admitting: *Deleted

## 2023-03-20 NOTE — Telephone Encounter (Signed)
Spoke with patient regarding appointment cancellation for tomorrow, 03/21/23, due to provider illness. Pt aware he should receive a call tomorrow to reschedule. Pt verbalized appreciation of call.

## 2023-03-21 ENCOUNTER — Ambulatory Visit: Payer: 59 | Admitting: Adult Health

## 2023-03-26 ENCOUNTER — Encounter: Payer: Self-pay | Admitting: Nurse Practitioner

## 2023-03-26 ENCOUNTER — Ambulatory Visit: Payer: 59 | Admitting: Nurse Practitioner

## 2023-03-26 VITALS — BP 130/81 | HR 72 | Temp 98.3°F | Ht 75.0 in | Wt 258.0 lb

## 2023-03-26 DIAGNOSIS — Z794 Long term (current) use of insulin: Secondary | ICD-10-CM

## 2023-03-26 DIAGNOSIS — E1122 Type 2 diabetes mellitus with diabetic chronic kidney disease: Secondary | ICD-10-CM

## 2023-03-26 DIAGNOSIS — I10 Essential (primary) hypertension: Secondary | ICD-10-CM

## 2023-03-26 DIAGNOSIS — I129 Hypertensive chronic kidney disease with stage 1 through stage 4 chronic kidney disease, or unspecified chronic kidney disease: Secondary | ICD-10-CM | POA: Diagnosis not present

## 2023-03-26 DIAGNOSIS — Z6832 Body mass index (BMI) 32.0-32.9, adult: Secondary | ICD-10-CM

## 2023-03-26 DIAGNOSIS — Z7985 Long-term (current) use of injectable non-insulin antidiabetic drugs: Secondary | ICD-10-CM

## 2023-03-26 DIAGNOSIS — N184 Chronic kidney disease, stage 4 (severe): Secondary | ICD-10-CM | POA: Diagnosis not present

## 2023-03-26 NOTE — Progress Notes (Signed)
Office: [phone]  /  Fax: [phone]  WEIGHT SUMMARY AND BIOMETRICS  Weight Lost Since Last Visit: 3lb  Weight Gained Since Last Visit: 0lb   Vitals Temp: 98.3 F (36.8 C) BP: 130/81 Pulse Rate: 72 SpO2: 99 %   Anthropometric Measurements Height: 6\' 3"  (1.905 m) Weight: 258 lb (117 kg) BMI (Calculated): 32.25 Weight at Last Visit: 261lb Weight Lost Since Last Visit: 3lb Weight Gained Since Last Visit: 0lb Starting Weight: 266lb Total Weight Loss (lbs): 8 lb (3.629 kg)   Body Composition  Body Fat %: 34.5 % Fat Mass (lbs): 89.2 lbs Muscle Mass (lbs): 160.8 lbs Total Body Water (lbs): 114.2 lbs Visceral Fat Rating : 17   Other Clinical Data Fasting: Yes Labs: No Today's Visit #: 4 Starting Date: 01/22/23     HPI  Chief Complaint: OBESITY  Alexander [name] is here to discuss his progress with his obesity treatment plan. He is on the the Category 2 Plan and states he is following his eating plan approximately 75-80 % of the time. He states he is exercising 20 minutes 5 days per week.   Interval History:  Since last office visit he has lost 3 pounds.  He is paying attention to what he eats, time he eats and what he is drinking. He is reducing/limiting fried foods. He has been weighing his proteins.  He is drinking more water and limiting sugary drinks.   He has been trying to be more active.  Alexander [name] father away and takes the stairs when he can. He has a stand up desk at work.   He has a treadmill and a bike and plans to start using them.  Struggles with some back pain.  Plans to make follow-up appointment with Ortho.   Pharmacotherapy for weight loss: He is not currently taking medications  for medical weight loss.    Previous pharmacotherapy for medical weight loss:  none  Bariatric surgery:  He has not had bariatric surgery  Hypertension Hypertension BP looks better today. Saw cardiology last on 03/06/23.   BP at home 120-140/80-90s.  He is overall feeling  better and denies shortness of breath. Medication(s): Coreg 12.5 mg twice daily, Lasix 20 mg as needed, Apresoline 50 mg twice daily.  Denies side effects. Denies chest pain, palpitations, LEE and SOB.  BP Readings from Last 3 Encounters:  03/26/23 130/81  03/06/23 122/70  03/05/23 (!) 144/90   Lab Results  Component Value Date   CREATININE 2.78 (H) 01/22/2023   CREATININE 2.81 (H) 12/05/2022   CREATININE 2.69 (H) 06/06/2022    Pharmacotherapy for DMT2:  He is currently taking Ozempic 0.5mg  and Tresiba 60 units daily.  Denies side effects.  Saw endo last on 02/25/23 and follow up appt on 06/27/23 and nephrology on 06/25/23. Last A1c was 7.1 CBGs: Fasting 97-119, 2 hour PP 180-200  Episodes of hypoglycemia: no  On statin, ASA 81mg .  ACE stopped due to elevated creat  Last eye exam:  Jan 2024  Lab Results  Component Value Date   HGBA1C 7.1 (H) 01/22/2023   HGBA1C 8.3 (A) 10/22/2022   HGBA1C 8.6 (A) 06/18/2022   Lab Results  Component Value Date   MICROALBUR 32.0 (H) 12/05/2022   LDLCALC 96 01/22/2023   CREATININE 2.78 (H) 01/22/2023    PHYSICAL EXAM:  Blood pressure 130/81, pulse 72, temperature 98.3 F (36.8 C), height 6\' 3"  (1.905 m), weight 258 lb (117 kg), SpO2 99%. Body mass index is 32.25 kg/m.  General: He is overweight,  cooperative, alert, well developed, and in no acute distress. PSYCH: Has normal mood, affect and thought process.   Extremities: No edema.  Neurologic: No gross sensory or motor deficits. No tremors or fasciculations noted.    DIAGNOSTIC DATA REVIEWED:  BMET    Component Value Date/Time   NA 142 01/22/2023 0953   K 4.6 01/22/2023 0953   CL 104 01/22/2023 0953   CO2 23 01/22/2023 0953   GLUCOSE 118 (H) 01/22/2023 0953   GLUCOSE 97 12/05/2022 0928   BUN 36 (H) 01/22/2023 0953   CREATININE 2.78 (H) 01/22/2023 0953   CREATININE 2.04 (H) 04/07/2015 1641   CALCIUM 9.3 01/22/2023 0953   GFRNONAA 33 (L) 04/05/2017 0537   GFRAA 38 (L)  04/05/2017 0537   Lab Results  Component Value Date   HGBA1C 7.1 (H) 01/22/2023   HGBA1C 12.1 (H) 10/27/2006   Lab Results  Component Value Date   INSULIN 5.2 01/22/2023   Lab Results  Component Value Date   TSH 1.210 01/22/2023   CBC    Component Value Date/Time   WBC 8.1 01/22/2023 0953   WBC 9.4 11/30/2021 1151   RBC 4.69 01/22/2023 0953   RBC 4.73 11/30/2021 1151   HGB 12.4 (L) 01/22/2023 0953   HCT 37.8 01/22/2023 0953   PLT 279 01/22/2023 0953   MCV 81 01/22/2023 0953   MCH 26.4 (L) 01/22/2023 0953   MCH 26.8 04/05/2017 0537   MCHC 32.8 01/22/2023 0953   MCHC 33.1 11/30/2021 1151   RDW 12.8 01/22/2023 0953   Iron Studies No results found for: "IRON", "TIBC", "FERRITIN", "IRONPCTSAT" Lipid Panel     Component Value Date/Time   CHOL 155 01/22/2023 0953   TRIG 74 01/22/2023 0953   HDL 45 01/22/2023 0953   CHOLHDL 4 12/05/2022 0928   VLDL 19.6 12/05/2022 0928   LDLCALC 96 01/22/2023 0953   LDLDIRECT 164.0 07/15/2014 1703   Hepatic Function Panel     Component Value Date/Time   PROT 7.1 01/22/2023 0953   ALBUMIN 4.1 01/22/2023 0953   AST 25 01/22/2023 0953   ALT 27 01/22/2023 0953   ALKPHOS 81 01/22/2023 0953   BILITOT 0.5 01/22/2023 0953   BILIDIR 0.1 01/08/2011 1012      Component Value Date/Time   TSH 1.210 01/22/2023 0953   Nutritional Lab Results  Component Value Date   VD25OH 52.6 01/22/2023   VD25OH 52.43 11/30/2021     ASSESSMENT AND PLAN  TREATMENT PLAN FOR OBESITY:  Recommended Dietary Goals  Alexander [name] is currently in the action stage of change. As such, his goal is to continue weight management plan. He has agreed to the Category 2 Plan.  Behavioral Intervention  We discussed the following Behavioral Modification Strategies today: increasing lean protein intake, decreasing simple carbohydrates , increasing vegetables, increasing lower glycemic fruits, increasing water intake, work on meal planning and preparation, work on  tracking and journaling calories using tracking application, reading food labels , keeping healthy foods at home, continue to practice mindfulness when eating, and planning for success.  Additional resources provided today: NA  Recommended Physical Activity Goals  Alexander [name] has been advised to work up to 150 minutes of moderate intensity aerobic activity a week and strengthening exercises 2-3 times per week for cardiovascular health, weight loss maintenance and preservation of muscle mass.   He has agreed to Continue current level of physical activity , Think about ways to increase daily physical activity and overcoming barriers to exercise, Increase physical activity in their day  and reduce sedentary time (increase NEAT)., and Work on scheduling and tracking physical activity.     ASSOCIATED CONDITIONS ADDRESSED TODAY  Action/Plan  Essential hypertension Continue to follow-up with cardiology.  Continue medications as directed.  Will continue to monitor BP at home and in office.  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  He has an appointment scheduled with nephrology on 06/25/2023 and endocrinology on 06/27/2023.  Morbid obesity (HCC)  BMI 32.0-32.9,adult       Goals:  Track calories, protein, sodium and carbs Increase exercising Increase water intake  Return in about 3 weeks (around 04/16/2023).Marland Kitchen He was informed of the importance of frequent follow up visits to maximize his success with intensive lifestyle modifications for his multiple health conditions.   ATTESTASTION STATEMENTS:  Reviewed by clinician on day of visit: allergies, medications, problem list, medical history, surgical history, family history, social history, and previous encounter notes.   Time spent on visit including pre-visit chart review and post-visit [name] and charting was 30 minutes.    Alexander Sato. Collan Schoenfeld FNP-C

## 2023-04-07 NOTE — Telephone Encounter (Signed)
Patient has been scheduled

## 2023-04-16 ENCOUNTER — Encounter: Payer: Self-pay | Admitting: Nurse Practitioner

## 2023-04-16 ENCOUNTER — Ambulatory Visit: Payer: 59 | Admitting: Nurse Practitioner

## 2023-04-16 VITALS — BP 135/80 | HR 81 | Temp 98.0°F | Ht 75.0 in | Wt 257.0 lb

## 2023-04-16 DIAGNOSIS — Z6832 Body mass index (BMI) 32.0-32.9, adult: Secondary | ICD-10-CM | POA: Diagnosis not present

## 2023-04-16 DIAGNOSIS — I10 Essential (primary) hypertension: Secondary | ICD-10-CM | POA: Diagnosis not present

## 2023-04-16 NOTE — Progress Notes (Signed)
Office: [phone]  /  Fax: [phone]  WEIGHT SUMMARY AND BIOMETRICS  Weight Lost Since Last Visit: 1lb  Weight Gained Since Last Visit: 0lb   Vitals Temp: 98 F (36.7 C) BP: 135/80 Pulse Rate: 81 SpO2: 98 %   Anthropometric Measurements Height: 6\' 3"  (1.905 m) Weight: 257 lb (116.6 kg) BMI (Calculated): 32.12 Weight at Last Visit: 258lb Weight Lost Since Last Visit: 1lb Weight Gained Since Last Visit: 0lb Starting Weight: 266lb Total Weight Loss (lbs): 9 lb (4.082 kg)   Body Composition  Body Fat %: 34.8 % Fat Mass (lbs): 89.8 lbs Muscle Mass (lbs): 159.8 lbs Total Body Water (lbs): 117.6 lbs Visceral Fat Rating : 17   Other Clinical Data Fasting: No Labs: No Today's Visit #: 5 Starting Date: 01/22/23     HPI  Chief Complaint: OBESITY  Aayush is here to discuss his progress with his obesity treatment plan. He is on the the Category 2 Plan and states he is following his eating plan approximately 75 % of the time. He states he is exercising 15-20 minutes 5 days per week.   Interval History:  Since last office visit he has lost 1 pound. He is not skipping meals.  He tries to eat a protein with each meal. He is trying to "pay more attention to the meal plan"  He is trying to drink more water and sometimes a coke zero.  He moved into a new building at work and has been taking the steps because the elevator is not working yet.  He's averaging around 7,500 steps per day. He has been using his stand up desk more.  His back pain is improving. Last MRI was 05/21/23.  Last cortisone injection was 12/23.   Pharmacotherapy for weight loss: He is not currently taking medications  for medical weight loss.    Previous pharmacotherapy for medical weight loss:  None  Bariatric surgery:  Patient has not had bariatric surgery   Hypertension Hypertension Saw cardiology last on 03/06/23.   BP at home 121-140/80-90s.  Has appt on 05/12/23 Medication(s): Coreg 12.5 mg  twice daily, Lasix 20 mg as needed-took 1 lasix today.  Water weight was up 3 lbs today.   Notes some LEE  BP Readings from Last 3 Encounters:  04/16/23 135/80  03/26/23 130/81  03/06/23 122/70   Lab Results  Component Value Date   CREATININE 2.78 (H) 01/22/2023   CREATININE 2.81 (H) 12/05/2022   CREATININE 2.69 (H) 06/06/2022      PHYSICAL EXAM:  Blood pressure 135/80, pulse 81, temperature 98 F (36.7 C), height 6\' 3"  (1.905 m), weight 257 lb (116.6 kg), SpO2 98%. Body mass index is 32.12 kg/m.  General: He is overweight, cooperative, alert, well developed, and in no acute distress. PSYCH: Has normal mood, affect and thought process.   Extremities: No edema.  Neurologic: No gross sensory or motor deficits. No tremors or fasciculations noted.    DIAGNOSTIC DATA REVIEWED:  BMET    Component Value Date/Time   NA 142 01/22/2023 0953   K 4.6 01/22/2023 0953   CL 104 01/22/2023 0953   CO2 23 01/22/2023 0953   GLUCOSE 118 (H) 01/22/2023 0953   GLUCOSE 97 12/05/2022 0928   BUN 36 (H) 01/22/2023 0953   CREATININE 2.78 (H) 01/22/2023 0953   CREATININE 2.04 (H) 04/07/2015 1641   CALCIUM 9.3 01/22/2023 0953   GFRNONAA 33 (L) 04/05/2017 0537   GFRAA 38 (L) 04/05/2017 0537   Lab Results  Component  Value Date   HGBA1C 7.1 (H) 01/22/2023   HGBA1C 12.1 (H) 10/27/2006   Lab Results  Component Value Date   INSULIN 5.2 01/22/2023   Lab Results  Component Value Date   TSH 1.210 01/22/2023   CBC    Component Value Date/Time   WBC 8.1 01/22/2023 0953   WBC 9.4 11/30/2021 1151   RBC 4.69 01/22/2023 0953   RBC 4.73 11/30/2021 1151   HGB 12.4 (L) 01/22/2023 0953   HCT 37.8 01/22/2023 0953   PLT 279 01/22/2023 0953   MCV 81 01/22/2023 0953   MCH 26.4 (L) 01/22/2023 0953   MCH 26.8 04/05/2017 0537   MCHC 32.8 01/22/2023 0953   MCHC 33.1 11/30/2021 1151   RDW 12.8 01/22/2023 0953   Iron Studies No results found for: "IRON", "TIBC", "FERRITIN", "IRONPCTSAT" Lipid  Panel     Component Value Date/Time   CHOL 155 01/22/2023 0953   TRIG 74 01/22/2023 0953   HDL 45 01/22/2023 0953   CHOLHDL 4 12/05/2022 0928   VLDL 19.6 12/05/2022 0928   LDLCALC 96 01/22/2023 0953   LDLDIRECT 164.0 07/15/2014 1703   Hepatic Function Panel     Component Value Date/Time   PROT 7.1 01/22/2023 0953   ALBUMIN 4.1 01/22/2023 0953   AST 25 01/22/2023 0953   ALT 27 01/22/2023 0953   ALKPHOS 81 01/22/2023 0953   BILITOT 0.5 01/22/2023 0953   BILIDIR 0.1 01/08/2011 1012      Component Value Date/Time   TSH 1.210 01/22/2023 0953   Nutritional Lab Results  Component Value Date   VD25OH 52.6 01/22/2023   VD25OH 52.43 11/30/2021     ASSESSMENT AND PLAN  TREATMENT PLAN FOR OBESITY:  Recommended Dietary Goals  Devynn is currently in the action stage of change. As such, his goal is to continue weight management plan. He has agreed to the Category 2 Plan.  Behavioral Intervention  We discussed the following Behavioral Modification Strategies today: increasing lean protein intake to established goals, increasing vegetables, increasing water intake , reading food labels , keeping healthy foods at home, and continue to work on maintaining a reduced calorie state, getting the recommended amount of protein, incorporating whole foods, making healthy choices, staying well hydrated and practicing mindfulness when eating..  Additional resources provided today: NA  Recommended Physical Activity Goals  Ibraham has been advised to work up to 150 minutes of moderate intensity aerobic activity a week and strengthening exercises 2-3 times per week for cardiovascular health, weight loss maintenance and preservation of muscle mass.   He has agreed to Continue current level of physical activity , Think about enjoyable ways to increase daily physical activity and overcoming barriers to exercise, and Increase physical activity in their day and reduce sedentary time (increase  NEAT).   ASSOCIATED CONDITIONS ADDRESSED TODAY  Action/Plan  Essential hypertension Continue to follow up with PCP, nephrology and cardiology.  Continue meds as directed. To monitor LEE and take lasix as needed.  If worsens or persist to reach out to cardiology. Water weight was up 3 lbs today.     Morbid obesity (HCC)  BMI 32.0-32.9,adult         Return in about 3 weeks (around 05/07/2023).Marland Kitchen He was informed of the importance of frequent follow up visits to maximize his success with intensive lifestyle modifications for his multiple health conditions.   ATTESTASTION STATEMENTS:  Reviewed by clinician on day of visit: allergies, medications, problem list, medical history, surgical history, family history, social history, and  previous encounter notes.   Time spent on visit including pre-visit chart review and post-visit care and charting was 30 minutes.    Theodis Sato. Remedy Corporan FNP-C

## 2023-04-23 ENCOUNTER — Other Ambulatory Visit: Payer: Self-pay | Admitting: Internal Medicine

## 2023-05-06 ENCOUNTER — Ambulatory Visit: Payer: 59 | Admitting: Bariatrics

## 2023-05-06 ENCOUNTER — Ambulatory Visit: Payer: 59 | Admitting: Nurse Practitioner

## 2023-05-06 ENCOUNTER — Encounter: Payer: Self-pay | Admitting: Bariatrics

## 2023-05-06 VITALS — BP 144/80 | HR 81 | Temp 97.9°F | Ht 75.0 in | Wt 260.0 lb

## 2023-05-06 DIAGNOSIS — Z6832 Body mass index (BMI) 32.0-32.9, adult: Secondary | ICD-10-CM

## 2023-05-06 DIAGNOSIS — E669 Obesity, unspecified: Secondary | ICD-10-CM | POA: Diagnosis not present

## 2023-05-06 DIAGNOSIS — E6609 Other obesity due to excess calories: Secondary | ICD-10-CM

## 2023-05-06 DIAGNOSIS — E1122 Type 2 diabetes mellitus with diabetic chronic kidney disease: Secondary | ICD-10-CM | POA: Diagnosis not present

## 2023-05-06 DIAGNOSIS — N184 Chronic kidney disease, stage 4 (severe): Secondary | ICD-10-CM | POA: Diagnosis not present

## 2023-05-06 DIAGNOSIS — Z7985 Long-term (current) use of injectable non-insulin antidiabetic drugs: Secondary | ICD-10-CM

## 2023-05-06 NOTE — Progress Notes (Signed)
WEIGHT SUMMARY AND BIOMETRICS  Weight Lost Since Last Visit: 0  Weight Gained Since Last Visit: 3lb   Vitals Temp: 97.9 F (36.6 C) BP: (!) 144/80 Pulse Rate: 81 SpO2: 96 %   Anthropometric Measurements Height: 6\' 3"  (1.905 m) Weight: 260 lb (117.9 kg) BMI (Calculated): 32.5 Weight at Last Visit: 257lb Weight Lost Since Last Visit: 0 Weight Gained Since Last Visit: 3lb Starting Weight: 266lb Total Weight Loss (lbs): 6 lb (2.722 kg)   Body Composition  Body Fat %: 34.8 % Fat Mass (lbs): 90.4 lbs Muscle Mass (lbs): 161.2 lbs Total Body Water (lbs): 122.2 lbs Visceral Fat Rating : 17   Other Clinical Data Fasting: no Labs: no Today's Visit #: 6 Starting Date: 01/22/23    OBESITY Alexander [name] is here to discuss his progress with his obesity treatment plan along with follow-up of his obesity related diagnoses.     Nutrition Plan: the Category 2 plan - 50% adherence.  Current exercise: walking  Interim History:  He is up 3 lbs since his last visit.  Not eating all of the food on the plan., Protein intake is as prescribed, Is not exceeding snack calorie allotment, Not journaling consistently., and Water intake is adequate.  Pharmacotherapy: Alexander [name] is on Alexander [name] 0.5 mg SQ weekly Adverse side effects: None Hunger is moderately controlled.  Cravings are moderately controlled.  Assessment/Plan:   Type II Diabetes HgbA1c is not at goal. Last A1c was 7.1 CBGs: Fasting 100's       Episodes of hypoglycemia: no Medication(s): Alexander [name] 0.5 mg SQ weekly  He states that his appetite is controlled.   Lab Results  Component Value Date   HGBA1C 7.1 (H) 01/22/2023   HGBA1C 8.3 (A) 10/22/2022   HGBA1C 8.6 (A) 06/18/2022   Lab Results  Component Value Date   MICROALBUR 32.0 (H) 12/05/2022   LDLCALC 96 01/22/2023   CREATININE 2.78 (H) 01/22/2023   Lab Results  Component Value Date   GFR 25.31 (L) 12/05/2022   GFR 26.77 (L) 06/06/2022   GFR 26.39 (L)  11/30/2021    Plan: Continue Alexander [name] 0.5 mg SQ weekly Continue all other medications.  Will keep all carbohydrates low both sweets and starches.  Will continue exercise regimen to 30 to 60 minutes on most days of the week.  Aim for 7 to 9 hours of sleep nightly.  Eat more low glycemic index foods.  He will have  glucose tablets at work.  He will eat healthier lunch options.    Generalized Obesity: Current BMI BMI (Calculated): 32.5   Pharmacotherapy Plan Continue  Alexander [name] 1 mg SQ weekly  Alexander [name] is currently in the action stage of change. As such, his goal is to continue with weight loss efforts.  He has agreed to the Category 2 plan.  Exercise goals: For substantial health benefits, adults should do at least 150 minutes (2 hours and 30 minutes) a week of moderate-intensity, or 75 minutes (1 hour and 15 minutes) a week of vigorous-intensity aerobic physical activity, or an equivalent combination of moderate- and vigorous-intensity aerobic activity. Aerobic activity should be performed in episodes of at least 10 minutes, and preferably, it should be spread throughout the week. Will continue some NEAT activities.   Behavioral modification strategies: increasing lean protein intake, decreasing simple carbohydrates , no meal skipping, meal planning , better snacking choices, planning for success, decrease junk food, measure portion sizes, and mindful eating.  Alexander [name] has agreed to follow-up with our clinic in 3 weeks.  Objective:   VITALS: Per patient if applicable, see vitals. GENERAL: Alert and in no acute distress. CARDIOPULMONARY: No increased WOB. Speaking in clear sentences.  PSYCH: Pleasant and cooperative. Speech normal rate and rhythm. Affect is appropriate. Insight and judgement are appropriate. Attention is focused, linear, and appropriate.  NEURO: Oriented as arrived to appointment on time with no prompting.   Attestation Statements:    Time spent on visit  including pre-visit chart review and post-visit charting and care was 30 minutes.    This was prepared with the assistance of Engineer, civil (consulting).  Occasional wrong-word or sound-a-like substitutions may have occurred due to the inherent limitations of voice recognition software.   Corinna Capra, DO

## 2023-05-11 NOTE — Progress Notes (Deleted)
  Cardiology Office Note:  .   Date:  05/11/2023  ID:  Alexander [name], DOB [birth year], MRN [id number] PCP: Myrlene Broker, MD  [facility] Providers Cardiologist: Chrystie Nose, MD {  }   History of Present Illness: Marland Kitchen   Alexander [name] is a 52 y.o. male with history of labile hypertension, prior history of HFrEF with improvement in LVEF to normal, hypertensive heart disease, hyperlipidemia, hypertensive nephropathy with chronic kidney disease stage III, IDDM, and ectopic atrial rhythm.  On last office visit with me on 03/06/2023 the patient came in with concerns of elevated blood pressure.  At that time his blood pressure was controlled.  I did give him a blood pressure recording sheet as he was to record this daily after taking his medications.  Also concerns for OSA.  The patient is becoming more active and losing weight and therefore we held off on any testing at that time.  He also had some complaints of GERD and I recommended an H2 blocker.  ROS: ***  Studies Reviewed: .      Echocardiogram 07/12/2025 Left ventricle: The cavity size was normal. There was mild    concentric hypertrophy. Systolic function was normal. The    estimated ejection fraction was in the range of 55% to 60%. Wall    motion was normal; there were no regional wall motion    abnormalities. Doppler parameters are consistent with abnormal    left ventricular relaxation (grade 1 diastolic dysfunction).  - Left atrium: The atrium was mildly dilated.   *** EKG Interpretation Date/Time:    Ventricular Rate:    PR Interval:    QRS Duration:    QT Interval:    QTC Calculation:   R Axis:      Text Interpretation:      Physical Exam:   VS:  There were no vitals taken for this visit.   Wt Readings from Last 3 Encounters:  05/06/23 260 lb (117.9 kg)  04/16/23 257 lb (116.6 kg)  03/26/23 258 lb (117 kg)    GEN: Well nourished, well developed in no acute distress NECK: No JVD; No carotid  bruits CARDIAC: ***RRR, no murmurs, rubs, gallops RESPIRATORY:  Clear to auscultation without rales, wheezing or rhonchi  ABDOMEN: Soft, non-tender, non-distended EXTREMITIES:  No edema; No deformity   ASSESSMENT AND PLAN: .   ***    {Are you ordering a CV Procedure (e.g. stress test, cath, DCCV, TEE, etc)?   Press F2        :[id number]}    Signed, Bettey Mare. Liborio Nixon, ANP, AACC

## 2023-05-12 ENCOUNTER — Ambulatory Visit: Payer: 59 | Attending: Adult Health | Admitting: Adult Health

## 2023-05-20 ENCOUNTER — Other Ambulatory Visit: Payer: Self-pay | Admitting: Internal Medicine

## 2023-05-26 ENCOUNTER — Encounter: Payer: Self-pay | Admitting: Nurse Practitioner

## 2023-05-26 ENCOUNTER — Ambulatory Visit: Payer: 59 | Admitting: Nurse Practitioner

## 2023-05-26 VITALS — BP 123/77 | HR 81 | Temp 97.7°F | Ht 75.0 in | Wt 256.0 lb

## 2023-05-26 DIAGNOSIS — E66811 Obesity, class 1: Secondary | ICD-10-CM

## 2023-05-26 DIAGNOSIS — Z6832 Body mass index (BMI) 32.0-32.9, adult: Secondary | ICD-10-CM | POA: Diagnosis not present

## 2023-05-26 DIAGNOSIS — N184 Chronic kidney disease, stage 4 (severe): Secondary | ICD-10-CM | POA: Diagnosis not present

## 2023-05-26 DIAGNOSIS — E1122 Type 2 diabetes mellitus with diabetic chronic kidney disease: Secondary | ICD-10-CM

## 2023-05-26 DIAGNOSIS — Z7985 Long-term (current) use of injectable non-insulin antidiabetic drugs: Secondary | ICD-10-CM

## 2023-05-26 DIAGNOSIS — Z794 Long term (current) use of insulin: Secondary | ICD-10-CM

## 2023-05-26 NOTE — Progress Notes (Signed)
Office: [phone]  /  Fax: [phone]  WEIGHT SUMMARY AND BIOMETRICS  Weight Lost Since Last Visit: 4lb  Weight Gained Since Last Visit: 0lb   Vitals Temp: 97.7 F (36.5 C) BP: 123/77 Pulse Rate: 81 SpO2: 96 %   Anthropometric Measurements Height: 6\' 3"  (1.905 m) Weight: 256 lb (116.1 kg) BMI (Calculated): 32 Weight at Last Visit: 260lb Weight Lost Since Last Visit: 4lb Weight Gained Since Last Visit: 0lb Starting Weight: 266lb Total Weight Loss (lbs): 10 lb (4.536 kg)   Body Composition  Body Fat %: 34.9 % Fat Mass (lbs): 89.4 lbs Muscle Mass (lbs): 158.8 lbs Total Body Water (lbs): 116.4 lbs Visceral Fat Rating : 17   Other Clinical Data Fasting: No Labs: No Today's Visit #: 7 Starting Date: 01/22/23     HPI  Chief Complaint: OBESITY  Alexander [name] is here to discuss his progress with his obesity treatment plan. He is on the the Category 2 Plan and states he is following his eating plan approximately 75 % of the time. He states he is exercising 15-20 minutes 3 days per week-walking.   Interval History:  Since last office visit he has lost 4 pounds. He has been making healthier choices and watching his portion sizes.  Has been eating more protein.  He is drinking more water daily. Doing better with polyphagia and cravings since starting Ozempic.   BF:  sandwich with 2 eggs with cheese Snack:  sometimes an apple or a fruit cup or cheese Lunch:  [place] sandwich with a 100 cal snack or deli bar with vegetables Snack:  sometimes a fruit Dinner:  protein, vegetable   Pharmacotherapy for weight loss: He is not currently taking medications  for medical weight loss.    Bariatric surgery:  Patient has not had bariatric surgery.   Pharmacotherapy for DMT2:  He is currently taking Ozempic 0.5mg  and Tresiba 60 units daily.  Denies side effects.  Has appt with Endo 06/27/23 Last A1c was 7.1 CBGs: Fasting 827-124, 2 hour PP 140s   Episodes of hypoglycemia:  no On statin, ASA 81mg . ACE stopped due to elevated creat  Last eye exam:  Jan 2024. Denies any visual changes  Lab Results  Component Value Date   HGBA1C 7.1 (H) 01/22/2023   HGBA1C 8.3 (A) 10/22/2022   HGBA1C 8.6 (A) 06/18/2022   Lab Results  Component Value Date   MICROALBUR 32.0 (H) 12/05/2022   LDLCALC 96 01/22/2023   CREATININE 2.78 (H) 01/22/2023      PHYSICAL EXAM:  Blood pressure 123/77, pulse 81, temperature 97.7 F (36.5 C), height 6\' 3"  (1.905 m), weight 256 lb (116.1 kg), SpO2 96%. Body mass index is 32 kg/m.  General: He is overweight, cooperative, alert, well developed, and in no acute distress. PSYCH: Has normal mood, affect and thought process.   Extremities: No edema.  Neurologic: No gross sensory or motor deficits. No tremors or fasciculations noted.    DIAGNOSTIC DATA REVIEWED:  BMET    Component Value Date/Time   NA 142 01/22/2023 0953   K 4.6 01/22/2023 0953   CL 104 01/22/2023 0953   CO2 23 01/22/2023 0953   GLUCOSE 118 (H) 01/22/2023 0953   GLUCOSE 97 12/05/2022 0928   BUN 36 (H) 01/22/2023 0953   CREATININE 2.78 (H) 01/22/2023 0953   CREATININE 2.04 (H) 04/07/2015 1641   CALCIUM 9.3 01/22/2023 0953   GFRNONAA 33 (L) 04/05/2017 0537   GFRAA 38 (L) 04/05/2017 0537   Lab Results  Component  Value Date   HGBA1C 7.1 (H) 01/22/2023   HGBA1C 12.1 (H) 10/27/2006   Lab Results  Component Value Date   INSULIN 5.2 01/22/2023   Lab Results  Component Value Date   TSH 1.210 01/22/2023   CBC    Component Value Date/Time   WBC 8.1 01/22/2023 0953   WBC 9.4 11/30/2021 1151   RBC 4.69 01/22/2023 0953   RBC 4.73 11/30/2021 1151   HGB 12.4 (L) 01/22/2023 0953   HCT 37.8 01/22/2023 0953   PLT 279 01/22/2023 0953   MCV 81 01/22/2023 0953   MCH 26.4 (L) 01/22/2023 0953   MCH 26.8 04/05/2017 0537   MCHC 32.8 01/22/2023 0953   MCHC 33.1 11/30/2021 1151   RDW 12.8 01/22/2023 0953   Iron Studies No results found for: "IRON", "TIBC",  "FERRITIN", "IRONPCTSAT" Lipid Panel     Component Value Date/Time   CHOL 155 01/22/2023 0953   TRIG 74 01/22/2023 0953   HDL 45 01/22/2023 0953   CHOLHDL 4 12/05/2022 0928   VLDL 19.6 12/05/2022 0928   LDLCALC 96 01/22/2023 0953   LDLDIRECT 164.0 07/15/2014 1703   Hepatic Function Panel     Component Value Date/Time   PROT 7.1 01/22/2023 0953   ALBUMIN 4.1 01/22/2023 0953   AST 25 01/22/2023 0953   ALT 27 01/22/2023 0953   ALKPHOS 81 01/22/2023 0953   BILITOT 0.5 01/22/2023 0953   BILIDIR 0.1 01/08/2011 1012      Component Value Date/Time   TSH 1.210 01/22/2023 0953   Nutritional Lab Results  Component Value Date   VD25OH 52.6 01/22/2023   VD25OH 52.43 11/30/2021     ASSESSMENT AND PLAN  TREATMENT PLAN FOR OBESITY:  Recommended Dietary Goals  Alexander [name] is currently in the action stage of change. As such, his goal is to continue weight management plan. He has agreed to keeping a food journal and adhering to recommended goals of 1200+ calories and 80+ protein.  Behavioral Intervention  We discussed the following Behavioral Modification Strategies today: increasing lean protein intake to established goals, increasing vegetables, avoiding skipping meals, increasing water intake , reading food labels , keeping healthy foods at home, and continue to work on maintaining a reduced calorie state, getting the recommended amount of protein, incorporating whole foods, making healthy choices, staying well hydrated and practicing mindfulness when eating..  Additional resources provided today: NA  Recommended Physical Activity Goals  Alexander [name] has been advised to work up to 150 minutes of moderate intensity aerobic activity a week and strengthening exercises 2-3 times per week for cardiovascular health, weight loss maintenance and preservation of muscle mass.   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   Class 1 obesity due to excess calories with body mass index (BMI) of 32.0 to 32.9 in adult, unspecified whether serious comorbidity present           Return in about 3 weeks (around 06/16/2023).Marland Kitchen He was informed of the importance of frequent follow up visits to maximize his success with intensive lifestyle modifications for his multiple health conditions.   ATTESTASTION STATEMENTS:  Reviewed  by clinician on day of visit: allergies, medications, problem list, medical history, surgical history, family history, social history, and previous encounter notes.   Time spent on visit including pre-visit chart review and post-visit care and charting was 30 minutes.    Theodis Sato. Seren Chaloux FNP-C

## 2023-06-16 ENCOUNTER — Ambulatory Visit: Payer: 59 | Admitting: Nurse Practitioner

## 2023-06-16 ENCOUNTER — Encounter: Payer: Self-pay | Admitting: Nurse Practitioner

## 2023-06-16 VITALS — BP 138/77 | HR 81 | Temp 98.7°F | Ht 75.0 in | Wt 256.0 lb

## 2023-06-16 DIAGNOSIS — E66811 Obesity, class 1: Secondary | ICD-10-CM | POA: Diagnosis not present

## 2023-06-16 DIAGNOSIS — E1122 Type 2 diabetes mellitus with diabetic chronic kidney disease: Secondary | ICD-10-CM | POA: Diagnosis not present

## 2023-06-16 DIAGNOSIS — N184 Chronic kidney disease, stage 4 (severe): Secondary | ICD-10-CM

## 2023-06-16 DIAGNOSIS — Z6832 Body mass index (BMI) 32.0-32.9, adult: Secondary | ICD-10-CM

## 2023-06-16 DIAGNOSIS — E6609 Other obesity due to excess calories: Secondary | ICD-10-CM

## 2023-06-16 DIAGNOSIS — Z7985 Long-term (current) use of injectable non-insulin antidiabetic drugs: Secondary | ICD-10-CM

## 2023-06-16 NOTE — Progress Notes (Signed)
Office: [phone]  /  Fax: [phone]  WEIGHT SUMMARY AND BIOMETRICS  Weight Lost Since Last Visit: 0lb  Weight Gained Since Last Visit: 0lb   Vitals Temp: 98.7 F (37.1 C) BP: 138/77 Pulse Rate: 81 SpO2: 98 %   Anthropometric Measurements Height: 6\' 3"  (1.905 m) Weight: 256 lb (116.1 kg) BMI (Calculated): 32 Weight at Last Visit: 256lb Weight Lost Since Last Visit: 0lb Weight Gained Since Last Visit: 0lb Starting Weight: 266lb Total Weight Loss (lbs): 10 lb (4.536 kg)   Body Composition  Body Fat %: 34.7 % Fat Mass (lbs): 89 lbs Muscle Mass (lbs): 159.2 lbs Total Body Water (lbs): 118.4 lbs Visceral Fat Rating : 17   Other Clinical Data Fasting: No Labs: No Today's Visit #: 8 Starting Date: 01/22/23     HPI  Chief Complaint: OBESITY  Cheng is here to discuss his progress with his obesity treatment plan. He is on the the Category 2 Plan and states he is following his eating plan approximately 85 % of the time. He states he is exercising 30 minutes 3-4 days per week.   Interval History:  Since last office visit he has maintained her weight.  Feels that he is doing well with Cat 2 meal pan.  He describes himself as being a sweet eater. He is trying to decrease his sweets intake.  Snacks on fruit or yogurt.    Pharmacotherapy for weight loss: He is not currently taking medications  for medical weight loss.     Bariatric surgery:  Patient has not had bariatric surgery.   Pharmacotherapy for DMT2:  He is currently taking Ozempic 0.5mg .  Denies side effects.  Has appt with Endo on 06/27/23 and nephrology on 06/25/23. Since starting his Ozempic, he notes it has helped with polyphagia.   Last A1c was 7.1 on 01/22/23 CBGs: Fasting 89-135, 2 hour PP 180 Episodes of hypoglycemia: no On statin, ASA 81mg . ACE stopped due to elevated creat  Last eye exam:  Jan 2024-no visual changes  Lab Results  Component Value Date   HGBA1C 7.1 (H) 01/22/2023   HGBA1C  8.3 (A) 10/22/2022   HGBA1C 8.6 (A) 06/18/2022   Lab Results  Component Value Date   MICROALBUR 32.0 (H) 12/05/2022   LDLCALC 96 01/22/2023   CREATININE 2.78 (H) 01/22/2023       PHYSICAL EXAM:  Blood pressure 138/77, pulse 81, temperature 98.7 F (37.1 C), height 6\' 3"  (1.905 m), weight 256 lb (116.1 kg), SpO2 98%. Body mass index is 32 kg/m.  General: He is overweight, cooperative, alert, well developed, and in no acute distress. PSYCH: Has normal mood, affect and thought process.   Extremities: No edema.  Neurologic: No gross sensory or motor deficits. No tremors or fasciculations noted.    DIAGNOSTIC DATA REVIEWED:  BMET    Component Value Date/Time   NA 142 01/22/2023 0953   K 4.6 01/22/2023 0953   CL 104 01/22/2023 0953   CO2 23 01/22/2023 0953   GLUCOSE 118 (H) 01/22/2023 0953   GLUCOSE 97 12/05/2022 0928   BUN 36 (H) 01/22/2023 0953   CREATININE 2.78 (H) 01/22/2023 0953   CREATININE 2.04 (H) 04/07/2015 1641   CALCIUM 9.3 01/22/2023 0953   GFRNONAA 33 (L) 04/05/2017 0537   GFRAA 38 (L) 04/05/2017 0537   Lab Results  Component Value Date   HGBA1C 7.1 (H) 01/22/2023   HGBA1C 12.1 (H) 10/27/2006   Lab Results  Component Value Date   INSULIN 5.2 01/22/2023  Lab Results  Component Value Date   TSH 1.210 01/22/2023   CBC    Component Value Date/Time   WBC 8.1 01/22/2023 0953   WBC 9.4 11/30/2021 1151   RBC 4.69 01/22/2023 0953   RBC 4.73 11/30/2021 1151   HGB 12.4 (L) 01/22/2023 0953   HCT 37.8 01/22/2023 0953   PLT 279 01/22/2023 0953   MCV 81 01/22/2023 0953   MCH 26.4 (L) 01/22/2023 0953   MCH 26.8 04/05/2017 0537   MCHC 32.8 01/22/2023 0953   MCHC 33.1 11/30/2021 1151   RDW 12.8 01/22/2023 0953   Iron Studies No results found for: "IRON", "TIBC", "FERRITIN", "IRONPCTSAT" Lipid Panel     Component Value Date/Time   CHOL 155 01/22/2023 0953   TRIG 74 01/22/2023 0953   HDL 45 01/22/2023 0953   CHOLHDL 4 12/05/2022 0928   VLDL 19.6  12/05/2022 0928   LDLCALC 96 01/22/2023 0953   LDLDIRECT 164.0 07/15/2014 1703   Hepatic Function Panel     Component Value Date/Time   PROT 7.1 01/22/2023 0953   ALBUMIN 4.1 01/22/2023 0953   AST 25 01/22/2023 0953   ALT 27 01/22/2023 0953   ALKPHOS 81 01/22/2023 0953   BILITOT 0.5 01/22/2023 0953   BILIDIR 0.1 01/08/2011 1012      Component Value Date/Time   TSH 1.210 01/22/2023 0953   Nutritional Lab Results  Component Value Date   VD25OH 52.6 01/22/2023   VD25OH 52.43 11/30/2021     ASSESSMENT AND PLAN  TREATMENT PLAN FOR OBESITY:  Recommended Dietary Goals  Bradly is currently in the action stage of change. As such, his goal is to continue weight management plan. He has agreed to the Category 2 Plan.  Behavioral Intervention  We discussed the following Behavioral Modification Strategies today: increasing lean protein intake to established goals, avoiding skipping meals, increasing water intake , work on meal planning and preparation, reading food labels , keeping healthy foods at home, planning for success, better snacking choices, celebration eating strategies, and continue to work on maintaining a reduced calorie state, getting the recommended amount of protein, incorporating whole foods, making healthy choices, staying well hydrated and practicing mindfulness when eating..  Additional resources provided today: NA  Recommended Physical Activity Goals  Kiet has been advised to work up to 150 minutes of moderate intensity aerobic activity a week and strengthening exercises 2-3 times per week for cardiovascular health, weight loss maintenance and preservation of muscle mass.   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   ATTESTASTION STATEMENTS:  Reviewed by clinician on day of visit: allergies, medications, problem list, medical history, surgical history, family history, social history, and previous encounter notes.   Time spent on visit including pre-visit chart review and post-visit care and charting was 20 minutes.    Theodis Sato. Anden Bartolo FNP-C

## 2023-06-27 ENCOUNTER — Ambulatory Visit: Payer: 59 | Admitting: Internal Medicine

## 2023-06-27 ENCOUNTER — Encounter: Payer: Self-pay | Admitting: Internal Medicine

## 2023-06-27 VITALS — BP 140/68 | HR 82 | Ht 75.0 in | Wt 261.6 lb

## 2023-06-27 DIAGNOSIS — E785 Hyperlipidemia, unspecified: Secondary | ICD-10-CM

## 2023-06-27 DIAGNOSIS — E1122 Type 2 diabetes mellitus with diabetic chronic kidney disease: Secondary | ICD-10-CM

## 2023-06-27 DIAGNOSIS — Z794 Long term (current) use of insulin: Secondary | ICD-10-CM

## 2023-06-27 DIAGNOSIS — N184 Chronic kidney disease, stage 4 (severe): Secondary | ICD-10-CM

## 2023-06-27 NOTE — Patient Instructions (Signed)
Please continue: - Tresiba U200 60 units at bedtime - Ozempic 0.5 mg weekly.  Please discuss with nephrology if we can use: [place] or Jardiance.  Please return in 4 months.

## 2023-06-27 NOTE — Progress Notes (Signed)
Patient ID: Alexander [name], male   DOB: Jan 22, 1971, 52 y.o.   MRN: [id number]  HPI: Alexander [name] is a 52 y.o.-year-old male, returning for follow-up for DM2, dx in 2004, insulin-dependent since 2013, uncontrolled, with complications (CHF, CKD, PN, DR). Pt. previously saw Dr. Everardo All, last visit with me 5 months ago.  Interim history: No increased urination, blurry vision, chest pain.  He does have acid reflux >> improved. He has back pain and had to have steroids (p.o. and injections) for this.  However, no steroid courses since last visit. He just started to go to [facility] [employer]. His wife also goes there. He stays active, walking more at work. He lost 10 lbs since last OV -per his scale at home.  Reviewed HbA1c: Lab Results  Component Value Date   HGBA1C 7.1 (H) 01/22/2023   HGBA1C 8.3 (A) 10/22/2022   HGBA1C 8.6 (A) 06/18/2022   HGBA1C 7.5 (A) 03/14/2022   HGBA1C 7.9 (A) 09/05/2021   HGBA1C 7.2 (A) 06/06/2021   HGBA1C 7.2 (A) 02/20/2021   HGBA1C 6.6 (A) 03/08/2020   HGBA1C 7.9 (A) 12/07/2019   HGBA1C 7.9 (A) 09/07/2019   He is on: - Tresiba U200 60 units at bedtime - Ozempic 0.25 >> 0.5 mg weekly-started 10/2022 He was previously on metformin, Levemir, Trulicity. He was previously on Fiasp 8 to 10 units before meals but stopped when starting Ozempic  Pt checks his sugars 1-2x a day and they are: - am: 130-180 >> 114-168, 181 >> 76-190, 251 >> 95-120 >> 84, 95-115, 124, 165 - 2h after b'fast: n/c - before lunch: n/c >> 132 >> 79, 121, 280 >> 100-150 >> ? - 2h after lunch: n/c - before dinner: n/c - 2h after dinner: n/c - bedtime: 151-203, 228 >> 159, 214-323 >> 130s >> ? - nighttime: n/c Lowest sugar was 79 >> 95 >> 84; he has hypoglycemia awareness at 80.  Highest sugar was 587 >> 378 >> 200 >> 180.  Glucometer: Freestyle lite  Pt's meals are: - Breakfast: protein shake - Lunch: largest meal: chicken tenders or salad - Dinner: pasta, chicken  fried or baked - Snacks: multiple - fruit He has an air fryer.  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  - last eye exam was on 07/29/2022. No DR reportedly.   - + numbness and tingling in his feet (L>R - 2/2 sciatica - improved after steroid inj.).  Last foot exam 02/25/2023.  Patient also has a history of HTN.  ROS: + see HPI  Past Medical History:  Diagnosis Date   CHEST PAIN 08/28/2009   DIABETES MELLITUS, TYPE II 03/20/2007   HYPERCHOLESTEROLEMIA 07/21/2008   HYPERTENSION 03/20/2007   Hypertensive urgency 03/27/2015   Left ventricular dysfunction 03/28/2015   EF 40-45%, no WMA, grade 2 diastolic dysfunction   Renal disorder    Past Surgical History:  Procedure Laterality Date   KNEE ARTHROSCOPY Right    Social History   Socioeconomic History   Marital status:  Married    Spouse name: Not on file   Number of children: Not on file   Years of education: Not on file   Highest education level: Not on file  Occupational History   Occupation: English as a second language teacher: [employer]  Tobacco Use   Smoking status: Never   Smokeless tobacco: Never  Vaping Use   Vaping status: Never Used  Substance and Sexual Activity   Alcohol use: No   Drug use: No   Sexual activity: Not on file  Other Topics Concern   Not on file  Social History Narrative   Not on file   Social Drivers of Health   Financial Resource Strain: Not on file  Food Insecurity: Not on file  Transportation Needs: Not on file  Physical Activity: Not on file  Stress: Not on file  Social Connections: Not on file   Intimate Partner Violence: Not on file   Current Outpatient Medications on File Prior to Visit  Medication Sig Dispense Refill   aspirin EC 81 MG EC tablet Take 1 tablet (81 mg total) by mouth daily.     atorvastatin (LIPITOR) 40 MG tablet Take 1 tablet (40 mg total) by mouth daily. 90 tablet 2   carvedilol (COREG) 12.5 MG tablet TAKE 1 TABLET (12.5MG  TOTAL) BY MOUTH TWICE A DAY WITH MEALS 180 tablet 0   furosemide (LASIX) 20 MG tablet TAKE 1 TABLET (20 MG TOTAL) BY MOUTH DAILY AS NEEDED (FOR SHORTNESS OF BREATH AND SWELLING.). 30 tablet 0   glucose blood (FREESTYLE LITE) test strip 1 each by Other route 2 (two) times daily. And lancets 2/day 200 each 3   hydrALAZINE (APRESOLINE) 50 MG tablet Take 1 tablet by mouth 2 (two) times daily.     insulin degludec (TRESIBA FLEXTOUCH) 200 UNIT/ML FlexTouch Pen INJECT 60 UNITS UNDER SKIN DAILY 18 mL 3   Insulin Pen Needle (BD PEN NEEDLE NANO U/F) 32G X 4 MM MISC USE 4X DAILY AS DIRECTED 400 each 3   Lancets (FREESTYLE) lancets Use to monitor glucose levels BID; E11.22 100 each 12   nitroGLYCERIN (NITROSTAT) 0.4 MG SL tablet Place 1 tablet (0.4 mg total) under the tongue every 5 (five) minutes x 3 doses as needed for chest pain. Don't take within 24 hrs of Cialis 25 tablet 0   Semaglutide,0.25 or 0.5MG /DOS, 2 MG/3ML SOPN Inject 0.5 mg into the skin once a week. 9 mL 3   tiZANidine (ZANAFLEX) 4 MG tablet Take 1 tablet (4 mg total) by mouth every 6 (six) hours as needed for muscle spasms. 30 tablet 0   Vitamin D, Ergocalciferol, (DRISDOL) 1.25 MG (50000 UNIT) CAPS capsule Take 1 capsule (50,000 Units total) by mouth every Monday. 5 capsule 2   No current facility-administered medications on file prior to visit.   No Known Allergies Family History  Problem Relation Age of Onset   Diabetes Mother    Diabetes Father    Hypertension Father    Diabetes Maternal Grandfather    Diabetes Paternal Grandfather    Diabetes Brother    Hypertension Brother     PE: BP (!) 140/68   Pulse 82   Ht 6\' 3"  (1.905 m)   Wt 261 lb 9.6 oz (118.7 kg)   SpO2 (!) 86%   BMI 32.70 kg/m  Wt Readings from Last 11 Encounters:  06/27/23 261 lb 9.6 oz (118.7 kg)  06/16/23 256 lb (116.1 kg)  05/26/23 256 lb (116.1 kg)  05/06/23 260 lb (117.9 kg)  04/16/23 257 lb (116.6 kg)  03/26/23 258 lb (117 kg)  03/06/23 268 lb (121.6 kg)  03/05/23 261 lb (118.4 kg)  02/25/23 269 lb (122 kg)  02/04/23 263 lb (119.3 kg)  01/22/23 266 lb (120.7 kg)   Constitutional: overweight, in NAD Eyes:  EOMI, no exophthalmos ENT: no neck masses, no cervical lymphadenopathy Cardiovascular: RRR, No MRG Respiratory: CTA B Musculoskeletal: no deformities Skin:no rashes Neurological: no tremor with outstretched hands  ASSESSMENT: 1. 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 HL  3.  Obesity class I  PLAN:  1. Patient with longstanding, uncontrolled, type is, on injectable antidiabetic regimen with long-acting insulin and weekly GLP-1 receptor agonist, with improved control.  At last visit, HbA1c was lower, at 7.1%.  Sugars are significant to be improved and they were mostly at goal despite stopping Fiasp after he ran out around the time we started Ozempic.  We continued without the rapid acting insulin.  At that time, I did advise him to discuss with nephrology whether an SGLT2 inhibitor could be used. -At today's visit, he is not checking sugars consistently but whenever checked, they are mostly at goal in the morning with few exceptions.  He does not remember sugars later in the day as he did not check them recently.  He also forgot to check with his nephrologist about an SGLT2 inhibitor but will go home and send him a message.  I advised him to let me know.  We did discuss that if we are able to start [place], we may be able to reduce the [place] dose.  For now, we will continue the current  regimen. - I suggested to:  Patient Instructions  Please continue: - Tresiba U200 60 units at bedtime - Ozempic 0.5 mg weekly.  Please discuss with nephrology if we can use: [place] or Jardiance.  Please return in 4 months.  - we checked his HbA1c: 6.5% (lower) - advised to check sugars at different times of the day - 1x a day, rotating check times - advised for yearly eye exams >> he is UTD - return to clinic in 4 months  2. HL -Reviewed lipid panel from 01/2023: LDL above target of less than 55 due to cardiovascular disease, otherwise fractions at goal: Lab Results  Component Value Date   CHOL 155 01/22/2023   HDL 45 01/22/2023   LDLCALC 96 01/22/2023   LDLDIRECT 164.0 07/15/2014   TRIG 74 01/22/2023   CHOLHDL 4 12/05/2022  -He continues Lipitor 40 mg daily without side effects  3.  Obesity class I -He lost 7 pounds after starting Ozempic, before last visit -He now goes to the [facility] -He lost 10 more pounds since then  Carlus Pavlov, MD PhD [facility]

## 2023-06-30 ENCOUNTER — Encounter: Payer: Self-pay | Admitting: Internal Medicine

## 2023-07-07 ENCOUNTER — Ambulatory Visit: Payer: 59 | Admitting: Nurse Practitioner

## 2023-07-07 ENCOUNTER — Encounter: Payer: Self-pay | Admitting: Nurse Practitioner

## 2023-07-07 VITALS — BP 122/76 | HR 85 | Temp 98.6°F | Ht 75.0 in | Wt 254.0 lb

## 2023-07-07 DIAGNOSIS — Z6831 Body mass index (BMI) 31.0-31.9, adult: Secondary | ICD-10-CM | POA: Diagnosis not present

## 2023-07-07 DIAGNOSIS — Z794 Long term (current) use of insulin: Secondary | ICD-10-CM

## 2023-07-07 DIAGNOSIS — E66811 Obesity, class 1: Secondary | ICD-10-CM | POA: Diagnosis not present

## 2023-07-07 DIAGNOSIS — E1122 Type 2 diabetes mellitus with diabetic chronic kidney disease: Secondary | ICD-10-CM

## 2023-07-07 DIAGNOSIS — N184 Chronic kidney disease, stage 4 (severe): Secondary | ICD-10-CM | POA: Diagnosis not present

## 2023-07-07 DIAGNOSIS — Z7985 Long-term (current) use of injectable non-insulin antidiabetic drugs: Secondary | ICD-10-CM

## 2023-07-07 NOTE — Progress Notes (Signed)
Office: [phone]  /  Fax: [phone]  WEIGHT SUMMARY AND BIOMETRICS  Weight Lost Since Last Visit: 2lb  Weight Gained Since Last Visit: 0lb   Vitals Temp: 98.6 F (37 C) BP: 122/76 Pulse Rate: 85 SpO2: 97 %   Anthropometric Measurements Height: 6\' 3"  (1.905 m) Weight: 254 lb (115.2 kg) BMI (Calculated): 31.75 Weight at Last Visit: 256lb Weight Lost Since Last Visit: 2lb Weight Gained Since Last Visit: 0lb Starting Weight: 266lb Total Weight Loss (lbs): 12 lb (5.443 kg)   Body Composition  Body Fat %: 33.8 % Fat Mass (lbs): 86 lbs Muscle Mass (lbs): 160.2 lbs Total Body Water (lbs): 113.8 lbs Visceral Fat Rating : 17   Other Clinical Data Fasting: Yes Labs: No Today's Visit #: 9 Starting Date: 01/22/23     HPI  Chief Complaint: OBESITY  Alexander [name] is here to discuss his progress with his obesity treatment plan. He is on the the Category 2 Plan and states he is following his eating plan approximately 85 % of the time. He states he is exercising 20 minutes 3 days per week.   Interval History:  Since last office visit he has lost 2 pounds. He is doing well with the meal plan. He is not skipping meals and is eating a protein with each meal.   He is drinking water and occ diet coke.  He got his wife a bike for Christmas. He also has weights and a treadmill at home. His goal is to start exercising more at home.     Pharmacotherapy for weight loss: He is not currently taking medications  for medical weight loss.     Bariatric surgery:  Patient has not had bariatric surgery.   Pharmacotherapy for DMT2:  He is currently taking Ozempic 0.5mg  and Tresiba 60units at bedtime.  Denies side effects.  He saw endo last on 06/27/23 and nephrology on 06/25/23 Last A1c was 6.5 on 06/27/23 CBGs: 89-150 Episodes of hypoglycemia: no On statin, ASA 81mg . ACE stopped due to elevated creat  Last eye exam:  Jan 2024-no visual changes-scheduled this week  Lab Results   Component Value Date   HGBA1C 7.1 (H) 01/22/2023   HGBA1C 8.3 (A) 10/22/2022   HGBA1C 8.6 (A) 06/18/2022   Lab Results  Component Value Date   MICROALBUR 32.0 (H) 12/05/2022   LDLCALC 96 01/22/2023   CREATININE 2.78 (H) 01/22/2023      PHYSICAL EXAM:  Blood pressure 122/76, pulse 85, temperature 98.6 F (37 C), height 6\' 3"  (1.905 m), weight 254 lb (115.2 kg), SpO2 97%. Body mass index is 31.75 kg/m.  General: He is overweight, cooperative, alert, well developed, and in no acute distress. PSYCH: Has normal mood, affect and thought process.   Extremities: No edema.  Neurologic: No gross sensory or motor deficits. No tremors or fasciculations noted.    DIAGNOSTIC DATA REVIEWED:  BMET    Component Value Date/Time   NA 142 01/22/2023 0953   K 4.6 01/22/2023 0953   CL 104 01/22/2023 0953   CO2 23 01/22/2023 0953   GLUCOSE 118 (H) 01/22/2023 0953   GLUCOSE 97 12/05/2022 0928   BUN 36 (H) 01/22/2023 0953   CREATININE 2.78 (H) 01/22/2023 0953   CREATININE 2.04 (H) 04/07/2015 1641   CALCIUM 9.3 01/22/2023 0953   GFRNONAA 33 (L) 04/05/2017 0537   GFRAA 38 (L) 04/05/2017 0537   Lab Results  Component Value Date   HGBA1C 7.1 (H) 01/22/2023   HGBA1C 12.1 (H) 10/27/2006  Lab Results  Component Value Date   INSULIN 5.2 01/22/2023   Lab Results  Component Value Date   TSH 1.210 01/22/2023   CBC    Component Value Date/Time   WBC 8.1 01/22/2023 0953   WBC 9.4 11/30/2021 1151   RBC 4.69 01/22/2023 0953   RBC 4.73 11/30/2021 1151   HGB 12.4 (L) 01/22/2023 0953   HCT 37.8 01/22/2023 0953   PLT 279 01/22/2023 0953   MCV 81 01/22/2023 0953   MCH 26.4 (L) 01/22/2023 0953   MCH 26.8 04/05/2017 0537   MCHC 32.8 01/22/2023 0953   MCHC 33.1 11/30/2021 1151   RDW 12.8 01/22/2023 0953   Iron Studies No results found for: "IRON", "TIBC", "FERRITIN", "IRONPCTSAT" Lipid Panel     Component Value Date/Time   CHOL 155 01/22/2023 0953   TRIG 74 01/22/2023 0953   HDL 45  01/22/2023 0953   CHOLHDL 4 12/05/2022 0928   VLDL 19.6 12/05/2022 0928   LDLCALC 96 01/22/2023 0953   LDLDIRECT 164.0 07/15/2014 1703   Hepatic Function Panel     Component Value Date/Time   PROT 7.1 01/22/2023 0953   ALBUMIN 4.1 01/22/2023 0953   AST 25 01/22/2023 0953   ALT 27 01/22/2023 0953   ALKPHOS 81 01/22/2023 0953   BILITOT 0.5 01/22/2023 0953   BILIDIR 0.1 01/08/2011 1012      Component Value Date/Time   TSH 1.210 01/22/2023 0953   Nutritional Lab Results  Component Value Date   VD25OH 52.6 01/22/2023   VD25OH 52.43 11/30/2021     ASSESSMENT AND PLAN  TREATMENT PLAN FOR OBESITY:  Recommended Dietary Goals  Alexander [name] is currently in the action stage of change. As such, his goal is to continue weight management plan. He has agreed to the Category 2 Plan.  Behavioral Intervention  We discussed the following Behavioral Modification Strategies today: reading food labels , keeping healthy foods at home, continue to work on implementation of reduced calorie nutritional plan, continue to practice mindfulness when eating, planning for success, better snacking choices, and continue to work on maintaining a reduced calorie state, getting the recommended amount of protein, incorporating whole foods, making healthy choices, staying well hydrated and practicing mindfulness when eating..  Additional resources provided today: NA  Recommended Physical Activity Goals  Alexander [name] has been advised to work up to 150 minutes of moderate intensity aerobic activity a week and strengthening exercises 2-3 times per week for cardiovascular health, weight loss maintenance and preservation of muscle mass.   He has agreed to Think about enjoyable ways to increase daily physical activity and overcoming barriers to exercise, Increase physical activity in their day and reduce sedentary time (increase NEAT)., Start strengthening exercises with a goal of 2-3 sessions a week , Increase the intensity,  frequency or duration of aerobic exercises  , and Work on scheduling and tracking physical activity.    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  Continue medications as directed.  Last A1c looked better at 6.5.  CKD (chronic kidney disease) stage 4, GFR 15-29 ml/min (HCC) Continue to follow-up with nephrology.  Class 1 obesity due to excess calories without serious comorbidity with body mass index (BMI) of 31.0 to 31.9 in adult      Goals Increase exercising-resistance training    Return in about 3 weeks (around 07/28/2023).Marland Kitchen He was informed of the importance of frequent follow up visits to maximize  his success with intensive lifestyle modifications for his multiple health conditions.   ATTESTASTION STATEMENTS:  Reviewed by clinician on day of visit: allergies, medications, problem list, medical history, surgical history, family history, social history, and previous encounter notes.   Time spent on visit including pre-visit chart review and post-visit care and charting was 30 minutes.    Theodis Sato. Primitivo Merkey FNP-C

## 2023-07-11 LAB — HM DIABETES EYE EXAM

## 2023-07-15 ENCOUNTER — Encounter: Payer: Self-pay | Admitting: Internal Medicine

## 2023-07-29 ENCOUNTER — Encounter: Payer: Self-pay | Admitting: Nurse Practitioner

## 2023-07-29 ENCOUNTER — Ambulatory Visit: Payer: 59 | Admitting: Nurse Practitioner

## 2023-07-29 VITALS — BP 156/78 | HR 78 | Temp 97.4°F | Ht 75.0 in | Wt 255.0 lb

## 2023-07-29 DIAGNOSIS — Z794 Long term (current) use of insulin: Secondary | ICD-10-CM

## 2023-07-29 DIAGNOSIS — E1122 Type 2 diabetes mellitus with diabetic chronic kidney disease: Secondary | ICD-10-CM

## 2023-07-29 DIAGNOSIS — I129 Hypertensive chronic kidney disease with stage 1 through stage 4 chronic kidney disease, or unspecified chronic kidney disease: Secondary | ICD-10-CM | POA: Diagnosis not present

## 2023-07-29 DIAGNOSIS — Z6831 Body mass index (BMI) 31.0-31.9, adult: Secondary | ICD-10-CM

## 2023-07-29 DIAGNOSIS — N184 Chronic kidney disease, stage 4 (severe): Secondary | ICD-10-CM | POA: Diagnosis not present

## 2023-07-29 DIAGNOSIS — I10 Essential (primary) hypertension: Secondary | ICD-10-CM

## 2023-07-29 DIAGNOSIS — E66811 Other obesity due to excess calories: Secondary | ICD-10-CM

## 2023-07-29 DIAGNOSIS — Z7984 Long term (current) use of oral hypoglycemic drugs: Secondary | ICD-10-CM

## 2023-07-29 NOTE — Progress Notes (Signed)
Office: [phone]  /  Fax: [phone]  WEIGHT SUMMARY AND BIOMETRICS  Weight Lost Since Last Visit: 0  Weight Gained Since Last Visit: 1lb   Vitals Temp: (!) 97.4 F (36.3 C) BP: (!) 156/78 Pulse Rate: 78 SpO2: 98 %   Anthropometric Measurements Height: 6\' 3"  (1.905 m) Weight: 255 lb (115.7 kg) BMI (Calculated): 31.87 Weight at Last Visit: 254lb Weight Lost Since Last Visit: 0 Weight Gained Since Last Visit: 1lb Starting Weight: 266lb Total Weight Loss (lbs): 11 lb (4.99 kg)   Body Composition  Body Fat %: 34.9 % Fat Mass (lbs): 89 lbs Muscle Mass (lbs): 158 lbs Total Body Water (lbs): 116.4 lbs Visceral Fat Rating : 17   Other Clinical Data Fasting: no Labs: no Today's Visit #: 10 Starting Date: 01/22/23     HPI  Chief Complaint: OBESITY  Alexander [name] is here to discuss his progress with his obesity treatment plan. He is on the the Category 2 Plan and states he is following his eating plan approximately 50 % of the time. He states he is exercising 15-20 minutes 3-4 days per week.   Interval History:  Since last office visit he has gained 1 pound.  He has been more active since his last visit. He is working on Field seismologist intake.  He occ drinks OJ or fruit punch.   He is planning to start exercising more at home-treadmill, bike and weights  Pharmacotherapy for weight loss: He is not currently taking medications  for medical weight loss.     Bariatric surgery:  Patient has not had bariatric surgery.   Pharmacotherapy for DMT2:  He is currently taking Ozempic 0.5mg  and Tresiba 60units at bedtime.  Denies side effects.  He saw endo last on 06/27/23 and nephrology on 06/25/23 .  Denies side effects.  Has appt with endo on 11/03/23   Last A1c was 6.5 12/24 and last creat was 2.47 on 06/25/23 CBGs: 84-160 Episodes of hypoglycemia: no On ACE or ARB, ASA 81mg  and statin.  On statin, ASA 81mg . ACE stopped due to elevated creat  Last eye exam:  Jan 2025    Lab Results  Component Value Date   HGBA1C 7.1 (H) 01/22/2023   HGBA1C 8.3 (A) 10/22/2022   HGBA1C 8.6 (A) 06/18/2022   Lab Results  Component Value Date   MICROALBUR 32.0 (H) 12/05/2022   LDLCALC 96 01/22/2023   CREATININE 2.78 (H) 01/22/2023     Hypertension Hypertension BP is elevated today.  Notes he forgot to take his BP pills today. Has appt with cardiology on 08/15/23 Denies chest pain, palpitations and SOB.  BP Readings from Last 3 Encounters:  07/29/23 (!) 156/78  07/07/23 122/76  06/27/23 (!) 140/68   Lab Results  Component Value Date   CREATININE 2.78 (H) 01/22/2023   CREATININE 2.81 (H) 12/05/2022   CREATININE 2.69 (H) 06/06/2022    PHYSICAL EXAM:  Blood pressure (!) 156/78, pulse 78, temperature (!) 97.4 F (36.3 C), height 6\' 3"  (1.905 m), weight 255 lb (115.7 kg), SpO2 98%. Body mass index is 31.87 kg/m.  General: He is overweight, cooperative, alert, well developed, and in no acute distress. PSYCH: Has normal mood, affect and thought process.   Extremities: No edema.  Neurologic: No gross sensory or motor deficits. No tremors or fasciculations noted.    DIAGNOSTIC DATA REVIEWED:  BMET    Component Value Date/Time   NA 142 01/22/2023 0953   K 4.6 01/22/2023 0953   CL 104 01/22/2023 3664  CO2 23 01/22/2023 0953   GLUCOSE 118 (H) 01/22/2023 0953   GLUCOSE 97 12/05/2022 0928   BUN 36 (H) 01/22/2023 0953   CREATININE 2.78 (H) 01/22/2023 0953   CREATININE 2.04 (H) 04/07/2015 1641   CALCIUM 9.3 01/22/2023 0953   GFRNONAA 33 (L) 04/05/2017 0537   GFRAA 38 (L) 04/05/2017 0537   Lab Results  Component Value Date   HGBA1C 7.1 (H) 01/22/2023   HGBA1C 12.1 (H) 10/27/2006   Lab Results  Component Value Date   INSULIN 5.2 01/22/2023   Lab Results  Component Value Date   TSH 1.210 01/22/2023   CBC    Component Value Date/Time   WBC 8.1 01/22/2023 0953   WBC 9.4 11/30/2021 1151   RBC 4.69 01/22/2023 0953   RBC 4.73 11/30/2021 1151   HGB  12.4 (L) 01/22/2023 0953   HCT 37.8 01/22/2023 0953   PLT 279 01/22/2023 0953   MCV 81 01/22/2023 0953   MCH 26.4 (L) 01/22/2023 0953   MCH 26.8 04/05/2017 0537   MCHC 32.8 01/22/2023 0953   MCHC 33.1 11/30/2021 1151   RDW 12.8 01/22/2023 0953   Iron Studies No results found for: "IRON", "TIBC", "FERRITIN", "IRONPCTSAT" Lipid Panel     Component Value Date/Time   CHOL 155 01/22/2023 0953   TRIG 74 01/22/2023 0953   HDL 45 01/22/2023 0953   CHOLHDL 4 12/05/2022 0928   VLDL 19.6 12/05/2022 0928   LDLCALC 96 01/22/2023 0953   LDLDIRECT 164.0 07/15/2014 1703   Hepatic Function Panel     Component Value Date/Time   PROT 7.1 01/22/2023 0953   ALBUMIN 4.1 01/22/2023 0953   AST 25 01/22/2023 0953   ALT 27 01/22/2023 0953   ALKPHOS 81 01/22/2023 0953   BILITOT 0.5 01/22/2023 0953   BILIDIR 0.1 01/08/2011 1012      Component Value Date/Time   TSH 1.210 01/22/2023 0953   Nutritional Lab Results  Component Value Date   VD25OH 52.6 01/22/2023   VD25OH 52.43 11/30/2021     ASSESSMENT AND PLAN  TREATMENT PLAN FOR OBESITY:  Recommended Dietary Goals  Alexander [name] is currently in the action stage of change. As such, his goal is to continue weight management plan. He has agreed to track using mynetdiary-will review macros including protein, carbs and fat at next visit.  Behavioral Intervention  We discussed the following Behavioral Modification Strategies today: work on meal planning and preparation, work on tracking and journaling calories using tracking application, reading food labels , continue to practice mindfulness when eating, planning for success, and continue to work on maintaining a reduced calorie state, getting the recommended amount of protein, incorporating whole foods, making healthy choices, staying well hydrated and practicing mindfulness when eating..  Additional resources provided today: NA  Recommended Physical Activity Goals  Alexander [name] has been advised to  work up to 150 minutes of moderate intensity aerobic activity a week and strengthening exercises 2-3 times per week for cardiovascular health, weight loss maintenance and preservation of muscle mass.   He has agreed to Think about enjoyable ways to increase daily physical activity and overcoming barriers to exercise, Increase physical activity in their day and reduce sedentary time (increase NEAT)., Start strengthening exercises with a goal of 2-3 sessions a week , Start aerobic activity with a goal of 150 minutes a week at moderate intensity. , and Work on scheduling and tracking physical activity.    ASSOCIATED CONDITIONS ADDRESSED TODAY  Action/Plan  Diabetes mellitus with stage 4 chronic kidney disease (  HCC) Continue to follow up with endo.  Continue meds as directed.    Essential hypertension Continue to follow up with PCP, nephrology and cardiology.  Take meds as directed   Class 1 obesity due to excess calories without serious comorbidity with body mass index (BMI) of 31.0 to 31.9 in adult         Return in about 4 weeks (around 08/26/2023).Marland Kitchen He was informed of the importance of frequent follow up visits to maximize his success with intensive lifestyle modifications for his multiple health conditions.   ATTESTASTION STATEMENTS:  Reviewed by clinician on day of visit: allergies, medications, problem list, medical history, surgical history, family history, social history, and previous encounter notes.   Time spent on visit including pre-visit chart review and post-visit care and charting was 30 minutes.    Theodis Sato. Lilyannah Zuelke FNP-C

## 2023-07-30 ENCOUNTER — Other Ambulatory Visit: Payer: Self-pay | Admitting: Internal Medicine

## 2023-08-06 ENCOUNTER — Other Ambulatory Visit: Payer: Self-pay | Admitting: Internal Medicine

## 2023-08-13 NOTE — Progress Notes (Signed)
 Cardiology Office Note:  .   Date:  08/15/2023  ID:  Alexander [name], DOB 07-06-1971, MRN [id number] PCP: Alexander [name] LABOR, MD   HeartCare Providers Cardiologist: Alexander JAYSON Maxcy, MD }   History of Present Illness: Alexander   [name] is a 53 y.o. male with history of labile hypertension, prior history of HFrEF with improvement in LVEF to normal, hypertensive heart disease, hyperlipidemia, hypertensive nephropathy with chronic kidney disease stage III, IDDM, and ectopic atrial rhythm.   Last seen in the office by me on 03/06/2023 his blood pressure was difficult to control.  He complained of snoring and daytime somnolence as well.  He was given blood pressure recording sheet and was asked to take his blood pressure daily and record.    He was continued on carvedilol  12.5 mg twice daily with meals, hydralazine  50 mg twice daily, and furosemide  as needed for swelling and shortness of breath.  I considered a sleep study to evaluate for OSA in the setting of snoring and daytime somnolence but he wished to hold off on that until follow-up visits.  He did have a renal artery ultrasound on 07/26/2023 which was normal.  He comes today having lost an additional 12 pounds.  He is part of [facility] [employer] [facility] at [facility], he walks at lunch with a group of coworkers for about 30 minutes.  His blood pressure is much better controlled with changes in his medication.  He feels well and is without dizziness chest pain or shortness of breath.  He does have some seasonal allergies  ROS: As above otherwise negative  Studies Reviewed: .    Echocardiogram 07/13/2015   Left ventricle: The cavity size was normal. There was mild    concentric hypertrophy. Systolic function was normal. The    estimated ejection fraction was in the range of 55% to 60%. Wall    motion was normal; there were no regional wall motion    abnormalities. Doppler parameters are consistent with abnormal    left ventricular  relaxation (grade 1 diastolic dysfunction).  - Left atrium: The atrium was mildly dilated.   Physical Exam:   VS:  BP (!) 150/70 (BP Location: Left Arm, Patient Position: Sitting, Cuff Size: Large)   Pulse 86   Ht 6' 3 (1.905 m)   Wt 262 lb (118.8 kg)   SpO2 96%   BMI 32.75 kg/m    Wt Readings from Last 3 Encounters:  08/15/23 262 lb (118.8 kg)  07/29/23 255 lb (115.7 kg)  07/07/23 254 lb (115.2 kg)    GEN: Well nourished, well developed in no acute distress NECK: No JVD; No carotid bruits CARDIAC: RRR, no murmurs, rubs, gallops RESPIRATORY:  Clear to auscultation without rales, wheezing or rhonchi  ABDOMEN: Soft, non-tender, non-distended EXTREMITIES:  No edema; No deformity   ASSESSMENT AND PLAN: .    Hypertension: Blood pressure is very well-controlled at home.  He is A record it is running in the 130s over 70s.  He is part of a weight loss program to the hospital and is lost an additional 12 pounds since being seen.  The patient has been advised that if he loses another 12 to 15 pounds and his blood pressure drops below 120/60 he is to call us  so that we can adjust his medications.  He may have refills on carvedilol  and hydralazine  90-day supply.     2.  Diabetes type 2: Followed by PCP he is stating that his hemoglobin  A1c is running 6.5.  He may not always need to take any extra doses of insulin .  With his weight loss his overall health status is significantly improved.   Signed, Alexander [name], ANP, AACC

## 2023-08-15 ENCOUNTER — Ambulatory Visit: Payer: 59 | Attending: Adult Health | Admitting: Adult Health

## 2023-08-15 ENCOUNTER — Encounter: Payer: Self-pay | Admitting: Adult Health

## 2023-08-15 VITALS — BP 150/70 | HR 86 | Ht 75.0 in | Wt 262.0 lb

## 2023-08-15 DIAGNOSIS — E1122 Type 2 diabetes mellitus with diabetic chronic kidney disease: Secondary | ICD-10-CM | POA: Diagnosis not present

## 2023-08-15 DIAGNOSIS — Z794 Long term (current) use of insulin: Secondary | ICD-10-CM | POA: Diagnosis not present

## 2023-08-15 DIAGNOSIS — I10 Essential (primary) hypertension: Secondary | ICD-10-CM | POA: Diagnosis not present

## 2023-08-15 DIAGNOSIS — N183 Chronic kidney disease, stage 3 unspecified: Secondary | ICD-10-CM

## 2023-08-15 NOTE — Patient Instructions (Signed)
 Medication Instructions:  NO CHANGES *If you need a refill on your cardiac medications before your next appointment, please call your pharmacy*   Lab Work: NO LABS If you have labs (blood work) drawn today and your tests are completely normal, you will receive your results only by: MyChart Message (if you have MyChart) OR A paper copy in the mail If you have any lab test that is abnormal or we need to change your treatment, we will call you to review the results.   Testing/Procedures: NO TESTING   Follow-Up: At [facility], you and your health needs are our priority.  As part of our continuing mission to provide you with exceptional heart care, we have created designated Provider Care Teams.  These Care Teams include your primary Cardiologist (physician) and Advanced Practice Providers (APPs -  Physician Assistants and Nurse Practitioners) who all work together to provide you with the care you need, when you need it.  Your next appointment:   6 month(s)  Provider:   Vinie JAYSON Maxcy, MD   Other Instructions IF BLOOD PRESSURE DROPS BELOW 120/60, GIVE OFFICE A CALL.

## 2023-08-25 ENCOUNTER — Ambulatory Visit: Payer: 59 | Admitting: Nurse Practitioner

## 2023-08-25 ENCOUNTER — Encounter: Payer: Self-pay | Admitting: Nurse Practitioner

## 2023-08-25 VITALS — BP 148/82 | HR 87 | Temp 98.4°F | Ht 75.0 in | Wt 256.0 lb

## 2023-08-25 DIAGNOSIS — E66811 Obesity, class 1: Secondary | ICD-10-CM

## 2023-08-25 DIAGNOSIS — I10 Essential (primary) hypertension: Secondary | ICD-10-CM

## 2023-08-25 DIAGNOSIS — I129 Hypertensive chronic kidney disease with stage 1 through stage 4 chronic kidney disease, or unspecified chronic kidney disease: Secondary | ICD-10-CM | POA: Diagnosis not present

## 2023-08-25 DIAGNOSIS — Z6832 Body mass index (BMI) 32.0-32.9, adult: Secondary | ICD-10-CM

## 2023-08-25 DIAGNOSIS — N184 Chronic kidney disease, stage 4 (severe): Secondary | ICD-10-CM

## 2023-08-25 DIAGNOSIS — E1122 Type 2 diabetes mellitus with diabetic chronic kidney disease: Secondary | ICD-10-CM | POA: Diagnosis not present

## 2023-08-25 DIAGNOSIS — Z7985 Long-term (current) use of injectable non-insulin antidiabetic drugs: Secondary | ICD-10-CM

## 2023-08-25 DIAGNOSIS — Z794 Long term (current) use of insulin: Secondary | ICD-10-CM

## 2023-08-25 NOTE — Progress Notes (Signed)
 Office: [phone]  /  Fax: [phone]  WEIGHT SUMMARY AND BIOMETRICS  Weight Lost Since Last Visit: 0lb  Weight Gained Since Last Visit: 1lb   Vitals Temp: 98.4 F (36.9 C) BP: (!) 148/82 (manual) Pulse Rate: 87 SpO2: 97 %   Anthropometric Measurements Height: 6\' 3"  (1.905 m) Weight: 256 lb (116.1 kg) BMI (Calculated): 32 Weight at Last Visit: 255lb Weight Lost Since Last Visit: 0lb Weight Gained Since Last Visit: 1lb Starting Weight: 266lb Total Weight Loss (lbs): 10 lb (4.536 kg)   Body Composition  Body Fat %: 34.3 % Fat Mass (lbs): 88 lbs Muscle Mass (lbs): 160.6 lbs Total Body Water (lbs): 116.4 lbs Visceral Fat Rating : 17   Other Clinical Data Fasting: Yes Labs: No Today's Visit #: 11 Starting Date: 01/22/23     HPI  Chief Complaint: OBESITY  Alexander [name] is here to discuss his progress with his obesity treatment plan. He is on the the Category 2 Plan and states he is following his eating plan approximately 40-50 % of the time. He states he is exercising 30 minutes 3 days per week.   Interval History:  Since last office visit he has gained 1 pound. He went out of town this past weekend.   He is walking at work 3 days per week for the past 3 weeks.  His fitness center at work opened over the past week.  He is drinking water daily.  He feels like he's getting into a rhythm of exercising.  Denies polyphagia or cravings. He notes that his clothes are fitting better.     He has several upcoming celebrations and vacations/traveling   Pharmacotherapy for weight loss: He is not currently taking medications  for medical weight loss.     Bariatric surgery:  Patient has not had bariatric surgery.   Pharmacotherapy for DMT2:   He is currently taking Ozempic 0.5mg  and Tresiba 60 units at bedtime.  Denies side effects.   He saw endo last on 06/27/23 and nephrology on 06/25/23.  Has appt with endo on 11/03/23 and nephrology 12/25/23 Last A1c was 6.5 12/24 and  last creat was 2.47 on 06/25/23 CBGs: 78-160-average 113 for the past week Episodes of hypoglycemia: no On statin-lipitor 40mg , ASA 81mg . ACE stopped due to elevated creat  Last eye exam:  Jan 2025     Lab Results  Component Value Date   HGBA1C 7.1 (H) 01/22/2023   HGBA1C 8.3 (A) 10/22/2022   HGBA1C 8.6 (A) 06/18/2022   Lab Results  Component Value Date   MICROALBUR 32.0 (H) 12/05/2022   LDLCALC 96 01/22/2023   CREATININE 2.78 (H) 01/22/2023     Hypertension Saw cardiology last on 08/15/23. Has follow up appt with nephrology on 12/25/23.  Medication(s): Coreg 12.5 mg twice daily, Lasix 20 mg PRN, hydralazine 50mg  BID.   Denies chest pain, palpitations and SOB.  BP Readings from Last 3 Encounters:  08/25/23 (!) 148/82  08/15/23 (!) 150/70  07/29/23 (!) 156/78   Lab Results  Component Value Date   CREATININE 2.78 (H) 01/22/2023   CREATININE 2.81 (H) 12/05/2022   CREATININE 2.69 (H) 06/06/2022    PHYSICAL EXAM:  Blood pressure (!) 148/82, pulse 87, temperature 98.4 F (36.9 C), height 6\' 3"  (1.905 m), weight 256 lb (116.1 kg), SpO2 97%. Body mass index is 32 kg/m.  General: He is overweight, cooperative, alert, well developed, and in no acute distress. PSYCH: Has normal mood, affect and thought process.   Extremities: No edema.  Neurologic: No gross sensory or motor deficits. No tremors or fasciculations noted.    DIAGNOSTIC DATA REVIEWED:  BMET    Component Value Date/Time   NA 142 01/22/2023 0953   K 4.6 01/22/2023 0953   CL 104 01/22/2023 0953   CO2 23 01/22/2023 0953   GLUCOSE 118 (H) 01/22/2023 0953   GLUCOSE 97 12/05/2022 0928   BUN 36 (H) 01/22/2023 0953   CREATININE 2.78 (H) 01/22/2023 0953   CREATININE 2.04 (H) 04/07/2015 1641   CALCIUM 9.3 01/22/2023 0953   GFRNONAA 33 (L) 04/05/2017 0537   GFRAA 38 (L) 04/05/2017 0537   Lab Results  Component Value Date   HGBA1C 7.1 (H) 01/22/2023   HGBA1C 12.1 (H) 10/27/2006   Lab Results  Component Value  Date   INSULIN 5.2 01/22/2023   Lab Results  Component Value Date   TSH 1.210 01/22/2023   CBC    Component Value Date/Time   WBC 8.1 01/22/2023 0953   WBC 9.4 11/30/2021 1151   RBC 4.69 01/22/2023 0953   RBC 4.73 11/30/2021 1151   HGB 12.4 (L) 01/22/2023 0953   HCT 37.8 01/22/2023 0953   PLT 279 01/22/2023 0953   MCV 81 01/22/2023 0953   MCH 26.4 (L) 01/22/2023 0953   MCH 26.8 04/05/2017 0537   MCHC 32.8 01/22/2023 0953   MCHC 33.1 11/30/2021 1151   RDW 12.8 01/22/2023 0953   Iron Studies No results found for: "IRON", "TIBC", "FERRITIN", "IRONPCTSAT" Lipid Panel     Component Value Date/Time   CHOL 155 01/22/2023 0953   TRIG 74 01/22/2023 0953   HDL 45 01/22/2023 0953   CHOLHDL 4 12/05/2022 0928   VLDL 19.6 12/05/2022 0928   LDLCALC 96 01/22/2023 0953   LDLDIRECT 164.0 07/15/2014 1703   Hepatic Function Panel     Component Value Date/Time   PROT 7.1 01/22/2023 0953   ALBUMIN 4.1 01/22/2023 0953   AST 25 01/22/2023 0953   ALT 27 01/22/2023 0953   ALKPHOS 81 01/22/2023 0953   BILITOT 0.5 01/22/2023 0953   BILIDIR 0.1 01/08/2011 1012      Component Value Date/Time   TSH 1.210 01/22/2023 0953   Nutritional Lab Results  Component Value Date   VD25OH 52.6 01/22/2023   VD25OH 52.43 11/30/2021     ASSESSMENT AND PLAN  TREATMENT PLAN FOR OBESITY:  Recommended Dietary Goals  Alexander [name] is currently in the action stage of change. As such, his goal is to continue weight management plan. He has agreed to track and will review macros at next visit.  Behavioral Intervention  We discussed the following Behavioral Modification Strategies today: increasing lean protein intake to established goals, decreasing simple carbohydrates , increasing vegetables, increasing fiber rich foods, increasing water intake , work on meal planning and preparation, work on tracking and journaling calories using tracking application, and continue to work on maintaining a reduced calorie  state, getting the recommended amount of protein, incorporating whole foods, making healthy choices, staying well hydrated and practicing mindfulness when eating..  Additional resources provided today: NA  Recommended Physical Activity Goals  Alexander [name] has been advised to work up to 150 minutes of moderate intensity aerobic activity a week and strengthening exercises 2-3 times per week for cardiovascular health, weight loss maintenance and preservation of muscle mass.   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  Continue meds as directed  Essential hypertension Continue to follow up with cardiology and nephrology.  Continue meds as directed  Class 1 obesity due to excess calories with serious comorbidity and body mass index (BMI) of 32.0 to 32.9 in adult         Return in about 4 weeks (around 09/22/2023).Marland Kitchen He was informed of the importance of frequent follow up visits to maximize his success with intensive lifestyle modifications for his multiple health conditions.   ATTESTASTION STATEMENTS:  Reviewed by clinician on day of visit: allergies, medications, problem list, medical history, surgical history, family history, social history, and previous encounter notes.   Time spent on visit including pre-visit chart review and post-visit care and charting was 30 minutes.    Theodis Sato. Mikaila Grunert FNP-C

## 2023-09-23 ENCOUNTER — Encounter: Payer: Self-pay | Admitting: Nurse Practitioner

## 2023-09-23 ENCOUNTER — Ambulatory Visit: Payer: 59 | Admitting: Nurse Practitioner

## 2023-09-23 VITALS — BP 125/78 | HR 76 | Temp 97.9°F | Ht 75.0 in | Wt 254.0 lb

## 2023-09-23 DIAGNOSIS — Z6831 Body mass index (BMI) 31.0-31.9, adult: Secondary | ICD-10-CM | POA: Diagnosis not present

## 2023-09-23 DIAGNOSIS — E6609 Other obesity due to excess calories: Secondary | ICD-10-CM

## 2023-09-23 DIAGNOSIS — E1122 Type 2 diabetes mellitus with diabetic chronic kidney disease: Secondary | ICD-10-CM

## 2023-09-23 DIAGNOSIS — N184 Chronic kidney disease, stage 4 (severe): Secondary | ICD-10-CM | POA: Diagnosis not present

## 2023-09-23 DIAGNOSIS — Z794 Long term (current) use of insulin: Secondary | ICD-10-CM

## 2023-09-23 DIAGNOSIS — E66811 Obesity, class 1: Secondary | ICD-10-CM

## 2023-09-23 DIAGNOSIS — Z7985 Long-term (current) use of injectable non-insulin antidiabetic drugs: Secondary | ICD-10-CM

## 2023-09-23 NOTE — Progress Notes (Addendum)
 Office: [phone]  /  Fax: [phone]  WEIGHT SUMMARY AND BIOMETRICS  Weight Lost Since Last Visit: 2lb  Weight Gained Since Last Visit: 0lb   Vitals Temp: 97.9 F (36.6 C) BP: 125/78 Pulse Rate: 76 SpO2: 99 %   Anthropometric Measurements Height: 6\' 3"  (1.905 m) Weight: 254 lb (115.2 kg) BMI (Calculated): 31.75 Weight at Last Visit: 256lb Weight Lost Since Last Visit: 2lb Weight Gained Since Last Visit: 0lb Starting Weight: 266lb Total Weight Loss (lbs): 12 lb (5.443 kg)   Body Composition  Body Fat %: 34 % Fat Mass (lbs): 86.4 lbs Muscle Mass (lbs): 159.4 lbs Total Body Water (lbs): 117.2 lbs Visceral Fat Rating : 17   Other Clinical Data Fasting: No Labs: No Today's Visit #: 12 Starting Date: 01/22/23     HPI  Chief Complaint: OBESITY  Alexander [name] is here to discuss his progress with his obesity treatment plan. He is on the the Category 2 Plan and states he is following his eating plan approximately 80 % of the time. He states he is exercising 35-40 minutes 2 days per week.   Interval History:  Since last office visit he has lost 2 pounds. He has been eating earlier and not eating late at night. He is focusing on eating more protein. He is watching his portion sizes.  He is trying to drink more water and drinks an occ cranberry juice or soda.  He is walking 2 days per week at work.  He's hoping to start exercising with his wife.   Pharmacotherapy for weight loss: He is not currently taking medications  for medical weight loss.     Bariatric surgery:  Patient has not had bariatric surgery.   Pharmacotherapy for DMT2:  He  is currently taking Ozempic 0.5mg  and Tresiba 60 units at bedtime.  Denies side effects.   He saw endo last on 06/27/23, cardiology last on 08/15/23 and nephrology on 06/25/23.  Has appt with endo on 11/03/23 and nephrology 12/25/23. Last A1c was 6.5 12/24 and last creat was 2.47 on 06/25/23 CBGs: 95-160s Episodes of hypoglycemia:  no On statin-lipitor 40mg , ASA 81mg . ACE stopped due to elevated creat  Last eye exam:  Jan 2025   Lab Results  Component Value Date   HGBA1C 7.1 (H) 01/22/2023   HGBA1C 8.3 (A) 10/22/2022   HGBA1C 8.6 (A) 06/18/2022   Lab Results  Component Value Date   MICROALBUR 32.0 (H) 12/05/2022   LDLCALC 96 01/22/2023   CREATININE 2.78 (H) 01/22/2023      PHYSICAL EXAM:  Blood pressure 125/78, pulse 76, temperature 97.9 F (36.6 C), height 6\' 3"  (1.905 m), weight 254 lb (115.2 kg), SpO2 99%. Body mass index is 31.75 kg/m.  General: He is overweight, cooperative, alert, well developed, and in no acute distress. PSYCH: Has normal mood, affect and thought process.   Extremities: No edema.  Neurologic: No gross sensory or motor deficits. No tremors or fasciculations noted.    DIAGNOSTIC DATA REVIEWED:  BMET    Component Value Date/Time   NA 142 01/22/2023 0953   K 4.6 01/22/2023 0953   CL 104 01/22/2023 0953   CO2 23 01/22/2023 0953   GLUCOSE 118 (H) 01/22/2023 0953   GLUCOSE 97 12/05/2022 0928   BUN 36 (H) 01/22/2023 0953   CREATININE 2.78 (H) 01/22/2023 0953   CREATININE 2.04 (H) 04/07/2015 1641   CALCIUM 9.3 01/22/2023 0953   GFRNONAA 33 (L) 04/05/2017 0537   GFRAA 38 (L) 04/05/2017 4401  Lab Results  Component Value Date   HGBA1C 7.1 (H) 01/22/2023   HGBA1C 12.1 (H) 10/27/2006   Lab Results  Component Value Date   INSULIN 5.2 01/22/2023   Lab Results  Component Value Date   TSH 1.210 01/22/2023   CBC    Component Value Date/Time   WBC 8.1 01/22/2023 0953   WBC 9.4 11/30/2021 1151   RBC 4.69 01/22/2023 0953   RBC 4.73 11/30/2021 1151   HGB 12.4 (L) 01/22/2023 0953   HCT 37.8 01/22/2023 0953   PLT 279 01/22/2023 0953   MCV 81 01/22/2023 0953   MCH 26.4 (L) 01/22/2023 0953   MCH 26.8 04/05/2017 0537   MCHC 32.8 01/22/2023 0953   MCHC 33.1 11/30/2021 1151   RDW 12.8 01/22/2023 0953   Iron Studies No results found for: "IRON", "TIBC", "FERRITIN",  "IRONPCTSAT" Lipid Panel     Component Value Date/Time   CHOL 155 01/22/2023 0953   TRIG 74 01/22/2023 0953   HDL 45 01/22/2023 0953   CHOLHDL 4 12/05/2022 0928   VLDL 19.6 12/05/2022 0928   LDLCALC 96 01/22/2023 0953   LDLDIRECT 164.0 07/15/2014 1703   Hepatic Function Panel     Component Value Date/Time   PROT 7.1 01/22/2023 0953   ALBUMIN 4.1 01/22/2023 0953   AST 25 01/22/2023 0953   ALT 27 01/22/2023 0953   ALKPHOS 81 01/22/2023 0953   BILITOT 0.5 01/22/2023 0953   BILIDIR 0.1 01/08/2011 1012      Component Value Date/Time   TSH 1.210 01/22/2023 0953   Nutritional Lab Results  Component Value Date   VD25OH 52.6 01/22/2023   VD25OH 52.43 11/30/2021     ASSESSMENT AND PLAN  TREATMENT PLAN FOR OBESITY:  Recommended Dietary Goals  Jeronimo is currently in the action stage of change. As such, his goal is to continue weight management plan. He has agreed to the Category 2 Plan.  Overall doing better with protein intake and limiting evening snacking.    Behavioral Intervention  We discussed the following Behavioral Modification Strategies today: increasing lean protein intake to established goals, decreasing simple carbohydrates , increasing vegetables, increasing water intake , work on meal planning and preparation, reading food labels , keeping healthy foods at home, continue to work on implementation of reduced calorie nutritional plan, continue to practice mindfulness when eating, planning for success, better snacking choices, and continue to work on maintaining a reduced calorie state, getting the recommended amount of protein, incorporating whole foods, making healthy choices, staying well hydrated and practicing mindfulness when eating..  Additional resources provided today: NA  Recommended Physical Activity Goals  Devarius has been advised to work up to 150 minutes of moderate intensity aerobic activity a week and strengthening exercises 2-3 times per week for  cardiovascular health, weight loss maintenance and preservation of muscle mass.   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 Take meds as directed   Keep follow-up appoint with endocrinology.  Class 1 obesity due to excess calories with serious comorbidity and body mass index (BMI) of 31.0 to 31.9 in adult  Return in about 4 weeks (around 10/21/2023).Marland Kitchen He was informed of the importance of frequent follow up visits to maximize his success with intensive lifestyle modifications for his multiple health conditions.   ATTESTASTION STATEMENTS:  Reviewed by clinician on day of visit: allergies, medications, problem list, medical history, surgical history, family history, social history, and previous encounter notes.   Time spent on visit including pre-visit chart review and post-visit care and charting was 30 minutes.    Theodis Sato. Tekila Caillouet FNP-C

## 2023-10-04 ENCOUNTER — Other Ambulatory Visit: Payer: Self-pay | Admitting: Internal Medicine

## 2023-10-27 ENCOUNTER — Encounter: Payer: Self-pay | Admitting: Nurse Practitioner

## 2023-10-27 ENCOUNTER — Ambulatory Visit: Admitting: Nurse Practitioner

## 2023-10-27 VITALS — BP 131/77 | HR 79 | Temp 98.3°F | Ht 75.0 in | Wt 253.0 lb

## 2023-10-27 DIAGNOSIS — Z794 Long term (current) use of insulin: Secondary | ICD-10-CM

## 2023-10-27 DIAGNOSIS — Z6831 Body mass index (BMI) 31.0-31.9, adult: Secondary | ICD-10-CM | POA: Diagnosis not present

## 2023-10-27 DIAGNOSIS — Z7985 Long-term (current) use of injectable non-insulin antidiabetic drugs: Secondary | ICD-10-CM

## 2023-10-27 DIAGNOSIS — E1122 Type 2 diabetes mellitus with diabetic chronic kidney disease: Secondary | ICD-10-CM | POA: Diagnosis not present

## 2023-10-27 DIAGNOSIS — N184 Chronic kidney disease, stage 4 (severe): Secondary | ICD-10-CM | POA: Diagnosis not present

## 2023-10-27 DIAGNOSIS — E66811 Obesity, class 1: Secondary | ICD-10-CM | POA: Diagnosis not present

## 2023-10-27 NOTE — Progress Notes (Signed)
 Office: [phone]  /  Fax: [phone]  WEIGHT SUMMARY AND BIOMETRICS  Weight Lost Since Last Visit: 1lb  Weight Gained Since Last Visit: 0lb   Vitals Temp: 98.3 F (36.8 C) BP: 131/77 Pulse Rate: 79 SpO2: 99 %   Anthropometric Measurements Height: 6\' 3"  (1.905 m) Weight: 253 lb (114.8 kg) BMI (Calculated): 31.62 Weight at Last Visit: 254lb Weight Lost Since Last Visit: 1lb Weight Gained Since Last Visit: 0lb Starting Weight: 266lb Total Weight Loss (lbs): 13 lb (5.897 kg)   Body Composition  Body Fat %: 33.5 % Fat Mass (lbs): 84.8 lbs Muscle Mass (lbs): 160.4 lbs Total Body Water (lbs): 117.2 lbs Visceral Fat Rating : 16   Other Clinical Data Fasting: No Labs: No Today's Visit #: 13 Starting Date: 01/22/23     HPI  Chief Complaint: OBESITY  Alexander [name] is here to discuss his progress with his obesity treatment plan. He is on the the Category 2 Plan and states he is following his eating plan approximately 75 % of the time. He states he is exercising 30-35 minutes 3 days per week.   Interval History:  Since last office visit he has lost 1 pound.  He is not skipping meals.   He is drinking water daily.   He is walking 2-3 days per week and started core exercises.    BF:  protein shake and sometimes eggs with toast-recently noticed eggs are causing some nausea and started eating toast to help with the nausea.  (Nausea did not start when he started taking Ozempic ) Snack:  chocolate, 100 cal packs, cheese Lunch:  varies:  eats at [employer], chicken, pizza, fish and chips Snack:  fruit Dinner:  varies: protein and vegetable Drinks:  water, OJ or apple juice  Pharmacotherapy for weight loss: He is not currently taking medications  for medical weight loss.     Bariatric surgery:  Patient has not had bariatric surgery.   Pharmacotherapy for DMT2:   He is currently taking Ozempic  0.5mg  and Tresiba  60 units at bedtime.  Denies side effects.   He saw endo  last on 06/27/23, cardiology last on 08/15/23 and nephrology on 06/25/23.  Has appt with endo on 11/03/23 and nephrology 12/25/23. Last A1c was 6.5 12/24 and last creat was 2.47 on 06/25/23 (see in care everywhere) CBGs: 70 (once)-130-usually runs 125-130 Episodes of hypoglycemia: no On statin-lipitor 40mg  & ASA 81mg . ACE stopped due to elevated creat  Last eye exam:  Jan 2025    Lab Results  Component Value Date   HGBA1C 7.1 (H) 01/22/2023   HGBA1C 8.3 (A) 10/22/2022   HGBA1C 8.6 (A) 06/18/2022   Lab Results  Component Value Date   MICROALBUR 32.0 (H) 12/05/2022   LDLCALC 96 01/22/2023   CREATININE 2.78 (H) 01/22/2023     PHYSICAL EXAM:  Blood pressure 131/77, pulse 79, temperature 98.3 F (36.8 C), height 6\' 3"  (1.905 m), weight 253 lb (114.8 kg), SpO2 99%. Body mass index is 31.62 kg/m.  General: He is overweight, cooperative, alert, well developed, and in no acute distress. PSYCH: Has normal mood, affect and thought process.   Extremities: No edema.  Neurologic: No gross sensory or motor deficits. No tremors or fasciculations noted.    DIAGNOSTIC DATA REVIEWED:  BMET    Component Value Date/Time   NA 142 01/22/2023 0953   K 4.6 01/22/2023 0953   CL 104 01/22/2023 0953   CO2 23 01/22/2023 0953   GLUCOSE 118 (H) 01/22/2023 0953   GLUCOSE  97 12/05/2022 0928   BUN 36 (H) 01/22/2023 0953   CREATININE 2.78 (H) 01/22/2023 0953   CREATININE 2.04 (H) 04/07/2015 1641   CALCIUM  9.3 01/22/2023 0953   GFRNONAA 33 (L) 04/05/2017 0537   GFRAA 38 (L) 04/05/2017 0537   Lab Results  Component Value Date   HGBA1C 7.1 (H) 01/22/2023   HGBA1C 12.1 (H) 10/27/2006   Lab Results  Component Value Date   INSULIN  5.2 01/22/2023   Lab Results  Component Value Date   TSH 1.210 01/22/2023   CBC    Component Value Date/Time   WBC 8.1 01/22/2023 0953   WBC 9.4 11/30/2021 1151   RBC 4.69 01/22/2023 0953   RBC 4.73 11/30/2021 1151   HGB 12.4 (L) 01/22/2023 0953   HCT 37.8  01/22/2023 0953   PLT 279 01/22/2023 0953   MCV 81 01/22/2023 0953   MCH 26.4 (L) 01/22/2023 0953   MCH 26.8 04/05/2017 0537   MCHC 32.8 01/22/2023 0953   MCHC 33.1 11/30/2021 1151   RDW 12.8 01/22/2023 0953   Iron Studies No results found for: "IRON", "TIBC", "FERRITIN", "IRONPCTSAT" Lipid Panel     Component Value Date/Time   CHOL 155 01/22/2023 0953   TRIG 74 01/22/2023 0953   HDL 45 01/22/2023 0953   CHOLHDL 4 12/05/2022 0928   VLDL 19.6 12/05/2022 0928   LDLCALC 96 01/22/2023 0953   LDLDIRECT 164.0 07/15/2014 1703   Hepatic Function Panel     Component Value Date/Time   PROT 7.1 01/22/2023 0953   ALBUMIN 4.1 01/22/2023 0953   AST 25 01/22/2023 0953   ALT 27 01/22/2023 0953   ALKPHOS 81 01/22/2023 0953   BILITOT 0.5 01/22/2023 0953   BILIDIR 0.1 01/08/2011 1012      Component Value Date/Time   TSH 1.210 01/22/2023 0953   Nutritional Lab Results  Component Value Date   VD25OH 52.6 01/22/2023   VD25OH 52.43 11/30/2021     ASSESSMENT AND PLAN  TREATMENT PLAN FOR OBESITY:  Recommended Dietary Goals  Loc is currently in the action stage of change. As such, his goal is to continue weight management plan. He has agreed to aim for 1500 calories and 100+ grams of protein .  Behavioral Intervention  We discussed the following Behavioral Modification Strategies today: increasing lean protein intake to established goals, decreasing simple carbohydrates , increasing vegetables, increasing fiber rich foods, increasing water intake , reading food labels , keeping healthy foods at home, and continue to work on maintaining a reduced calorie state, getting the recommended amount of protein, incorporating whole foods, making healthy choices, staying well hydrated and practicing mindfulness when eating..  Additional resources provided today: NA  Recommended Physical Activity Goals  Gibbs has been advised to work up to 150 minutes of moderate intensity aerobic  activity a week and strengthening exercises 2-3 times per week for cardiovascular health, weight loss maintenance and preservation of muscle mass.   He has agreed to Think about enjoyable ways to increase daily physical activity and overcoming barriers to exercise, Increase physical activity in their day and reduce sedentary time (increase NEAT)., Increase the intensity, frequency or duration of strengthening exercises , and Increase the intensity, frequency or duration of aerobic exercises     ASSOCIATED CONDITIONS ADDRESSED TODAY  Action/Plan  Diabetes mellitus with stage 4 chronic kidney disease (HCC) Keep appt with endo.  Plans to discuss insulin  and Ozempic  with endo.  Class 1 obesity due to excess calories with serious comorbidity and body mass index (  BMI) of 31.0 to 31.9 in adult     I've asked him to track and send me his weight, calories and protein in one week.     Return in about 4 weeks (around 11/24/2023).Aaron Aas He was informed of the importance of frequent follow up visits to maximize his success with intensive lifestyle modifications for his multiple health conditions.   ATTESTASTION STATEMENTS:  Reviewed by clinician on day of visit: allergies, medications, problem list, medical history, surgical history, family history, social history, and previous encounter notes.   Time spent on visit including pre-visit chart review and post-visit care and charting was 30 minutes.    Crist Dominion. Jaquann Guarisco FNP-C

## 2023-11-03 ENCOUNTER — Ambulatory Visit: Payer: 59 | Admitting: Internal Medicine

## 2023-11-03 ENCOUNTER — Encounter: Payer: Self-pay | Admitting: Internal Medicine

## 2023-11-03 VITALS — BP 150/82 | HR 82 | Ht 75.0 in | Wt 261.0 lb

## 2023-11-03 DIAGNOSIS — N184 Chronic kidney disease, stage 4 (severe): Secondary | ICD-10-CM

## 2023-11-03 DIAGNOSIS — E785 Hyperlipidemia, unspecified: Secondary | ICD-10-CM

## 2023-11-03 DIAGNOSIS — E1122 Type 2 diabetes mellitus with diabetic chronic kidney disease: Secondary | ICD-10-CM

## 2023-11-03 DIAGNOSIS — Z794 Long term (current) use of insulin: Secondary | ICD-10-CM

## 2023-11-03 LAB — POCT GLYCOSYLATED HEMOGLOBIN (HGB A1C): Hemoglobin A1C: 6.4 % — AB (ref 4.0–5.6)

## 2023-11-03 MED ORDER — OZEMPIC (1 MG/DOSE) 4 MG/3ML ~~LOC~~ SOPN: 1.0000 mg | PEN_INJECTOR | SUBCUTANEOUS | 3 refills | Status: AC

## 2023-11-03 MED ORDER — TRESIBA FLEXTOUCH 200 UNIT/ML ~~LOC~~ SOPN: PEN_INJECTOR | SUBCUTANEOUS | Status: DC

## 2023-11-03 NOTE — Patient Instructions (Addendum)
 Please reduce: - Tresiba  U200 46 units at bedtime  Increase: - Ozempic  1 mg weekly.  Please check with cardiology if we can use Jardiance or Farxiga.  Please return in 4 months (before 02/25/2024).

## 2023-11-03 NOTE — Progress Notes (Signed)
 Patient ID: Alexander [name], male   DOB: [birth year], 53 y.o.   MRN: [id number]  HPI: Alexander [name] is a 53 y.o.-year-old male, returning for follow-up for DM2, dx in 2004, insulin -dependent since 2013, uncontrolled, with complications (CHF, CKD, PN, DR). Pt. previously saw Dr. Washington Hacker, last visit with me 4 months ago.  Interim history: No increased urination, blurry vision, chest pain.  He has back pain and had to have steroids (p.o. and injections) for this.  However, no steroid courses since last visit. He just started to go to [facility] [employer]. His wife also goes there. He stays active, walking more at work.  He is not eating after 9 PM.  Reviewed HbA1c levels: 06/27/2023: HbA1c 6.5% Lab Results  Component Value Date   HGBA1C 7.1 (H) 01/22/2023   HGBA1C 8.3 (A) 10/22/2022   HGBA1C 8.6 (A) 06/18/2022   HGBA1C 7.5 (A) 03/14/2022   HGBA1C 7.9 (A) 09/05/2021   HGBA1C 7.2 (A) 06/06/2021   HGBA1C 7.2 (A) 02/20/2021   HGBA1C 6.6 (A) 03/08/2020   HGBA1C 7.9 (A) 12/07/2019   HGBA1C 7.9 (A) 09/07/2019   He is on: - Tresiba  U200 60 units at bedtime - Ozempic  0.25 >> 0.5 mg weekly-started 10/2022 He was previously on metformin , Levemir , Trulicity . He was previously on Fiasp  8 to 10 units before meals but stopped when starting Ozempic   Pt checks his sugars 1-2x a day and they are: - am: 76-190, 251 >> 95-120 >> 84, 95-115, 124, 165 >> 70, 80-135, 152 - 2h after b'fast: n/c - before lunch: n/c >> 132 >> 79, 121, 280 >> 100-150 >> n/c - 2h after lunch: n/c >> 177 - before dinner: n/c - 2h after dinner: n/c - bedtime: 151-203, 228 >> 159, 214-323 >> 130s >> n/c - nighttime: n/c Lowest sugar was 79 >> 95 >> 84; he has hypoglycemia awareness at 80.  Highest sugar was 587 >> 378 >> 200 >> 180.  Glucometer: Freestyle lite  Pt's meals are: - Breakfast: protein shake - Lunch: largest meal: chicken tenders or salad - Dinner: pasta, chicken fried or baked - Snacks:  fruit He has an air fryer.  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  - last eye exam was on 07/11/2023. No DR.  - + numbness and tingling in his feet (L>R - 2/2 sciatica - improved after steroid inj.).  Last foot exam 02/25/2023.  Patient also has a history of HTN.  ROS: + see HPI  Past Medical History:  Diagnosis Date   CHEST PAIN 08/28/2009   DIABETES MELLITUS, TYPE II 03/20/2007   HYPERCHOLESTEROLEMIA 07/21/2008   HYPERTENSION 03/20/2007   Hypertensive urgency 03/27/2015   Left ventricular dysfunction 03/28/2015   EF 40-45%, no WMA, grade 2 diastolic dysfunction   Renal disorder    Past Surgical History:  Procedure Laterality Date   KNEE ARTHROSCOPY Right    Social History   Socioeconomic History   Marital status: Married    Spouse name: Not on file  Number of children: Not on file   Years of education: Not on file   Highest education level: Not on file  Occupational History   Occupation: English as a second language teacher: [employer]  Tobacco Use   Smoking status: Never   Smokeless tobacco: Never  Vaping Use   Vaping status: Never Used  Substance and Sexual Activity   Alcohol use: No   Drug use: No   Sexual activity: Not on file  Other Topics Concern   Not on file  Social History Narrative   Not on file   Social Drivers of Health   Financial Resource Strain: Not on file  Food Insecurity: Not on file  Transportation Needs: Not on file  Physical Activity: Not on file  Stress: Not on file  Social Connections: Not on file  Intimate Partner Violence: Not on file   Current  Outpatient Medications on File Prior to Visit  Medication Sig Dispense Refill   aspirin  EC 81 MG EC tablet Take 1 tablet (81 mg total) by mouth daily.     atorvastatin  (LIPITOR) 40 MG tablet TAKE 1 TABLET BY MOUTH EVERY DAY 90 tablet 1   carvedilol  (COREG ) 12.5 MG tablet TAKE 1 TABLET BY MOUTH TWICE A DAY WITH MEALS 180 tablet 1   furosemide  (LASIX ) 20 MG tablet TAKE 1 TABLET (20 MG TOTAL) BY MOUTH DAILY AS NEEDED (FOR SHORTNESS OF BREATH AND SWELLING.). 30 tablet 0   glucose blood (FREESTYLE LITE) test strip 1 each by Other route 2 (two) times daily. And lancets 2/day 200 each 3   hydrALAZINE  (APRESOLINE ) 50 MG tablet Take 1 tablet by mouth 2 (two) times daily.     insulin  degludec (TRESIBA  FLEXTOUCH) 200 UNIT/ML FlexTouch Pen INJECT 60 UNITS UNDER SKIN DAILY 18 mL 3   Insulin  Pen Needle (BD PEN NEEDLE NANO U/F) 32G X 4 MM MISC USE 4X DAILY AS DIRECTED 400 each 3   Lancets (FREESTYLE) lancets Use to monitor glucose levels BID; E11.22 100 each 12   nitroGLYCERIN  (NITROSTAT ) 0.4 MG SL tablet Place 1 tablet (0.4 mg total) under the tongue every 5 (five) minutes x 3 doses as needed for chest pain. Don't take within 24 hrs of Cialis  25 tablet 0   Semaglutide ,0.25 or 0.5MG /DOS, (OZEMPIC , 0.25 OR 0.5 MG/DOSE,) 2 MG/3ML SOPN INJECT 0.5 MG INTO THE SKIN ONE TIME PER WEEK 9 mL 3   tiZANidine  (ZANAFLEX ) 4 MG tablet Take 1 tablet (4 mg total) by mouth every 6 (six) hours as needed for muscle spasms. 30 tablet 0   Vitamin D , Ergocalciferol , (DRISDOL ) 1.25 MG (50000 UNIT) CAPS capsule Take 1 capsule (50,000 Units total) by mouth every Monday. 5 capsule 2   No current facility-administered medications on file prior to visit.   No Known Allergies Family History  Problem Relation Age of Onset   Diabetes Mother    Diabetes Father    Hypertension Father    Diabetes Maternal Grandfather    Diabetes Paternal Grandfather    Diabetes Brother    Hypertension Brother    PE: BP (!) 150/82   Pulse 82   Ht 6'  3" (1.905 m)   Wt 261 lb (118.4 kg)   SpO2 97%   BMI 32.62 kg/m  Wt Readings from Last 11 Encounters:  11/03/23 261 lb (118.4 kg)  10/27/23 253 lb (114.8 kg)  09/23/23 254 lb (115.2 kg)  08/25/23 256 lb (116.1 kg)  08/15/23 262 lb (118.8 kg)  07/29/23 255 lb (115.7 kg)  07/07/23  254 lb (115.2 kg)  06/27/23 261 lb 9.6 oz (118.7 kg)  06/16/23 256 lb (116.1 kg)  05/26/23 256 lb (116.1 kg)  05/06/23 260 lb (117.9 kg)   Constitutional: overweight, in NAD Eyes:  EOMI, no exophthalmos ENT: no neck masses, no cervical lymphadenopathy Cardiovascular: RRR, No MRG Respiratory: CTA B Musculoskeletal: no deformities Skin:no rashes Neurological: no tremor with outstretched hands  ASSESSMENT: 1. 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 HL  3.  Obesity class I  PLAN:  1. Patient with longstanding, uncontrolled, type 2 diabetes, on injectable antidiabetic regimen with long-acting insulin  and weekly GLP-1 receptor agonist, with improved control.  HbA1c at last visit was 6.5%, decreased from 7.1%.  He was not checking sugars consistently but whenever checked, they were mostly at goal in the morning with few exceptions.  He did not check sugars later in the day and I advised him to start doing so.  I also again advised him to check with his nephrologist to see if we can use an SGLT2 inhibitor.  We otherwise continued the same regimen. -At today's visit, sugars are controlled but he is mostly checking in the morning.  Some of these are higher, above 130, and I suspect that this is due to him eating late.  We discussed that ideally he would start eating at 7 PM, rather than 9 PM.  He will try this.  We also discussed about eating lower rather than higher glycemic index fruit.  Since he is working on weight loss, we also discussed about possibly decreasing the dose of Tresiba  and increasing the dose of Ozempic , which he  absolutely agrees with.  He checked with his nephrologist and it appears that we could use Farxiga and Jardiance if cleared by cardiology.  He will check with his cardiologist to see if we can use these.  I advised him to let me know.  I did explain that we would mostly at this for the cardiovascular and renal benefit, since the impact of SGLT2 inhibitors on blood sugars vary inversely proportionally with a GFR. - I suggested to:  Patient Instructions  Please reduce: - Tresiba  U200 46 units at bedtime  Increase: - Ozempic  1 mg weekly.  Please check with cardiology if we can use Jardiance or Farxiga.  Please return in 4 months (before 02/25/2024).  - we checked his HbA1c: 6.4% (lower) - advised to check sugars at different times of the day - 1x a day, rotating check times - advised for yearly eye exams >> he is UTD - return to clinic in 4 months  2. HL - Latest lipid panel showed an LDL above our target of less than 55 due to cardiovascular disease, but much improved from baseline, otherwise fractions at goal: Lab Results  Component Value Date   CHOL 155 01/22/2023   HDL 45 01/22/2023   LDLCALC 96 01/22/2023   LDLDIRECT 164.0 07/15/2014   TRIG 74 01/22/2023   CHOLHDL 4 12/05/2022  - He continues Lipitor 40 mg daily without side effects  3.  Obesity class I - He lost 17 pounds after starting Ozempic , before the last 2 visits combined; at today's visit, will increase the Ozempic  dose and lower the insulin  dose, which should help. - Now goes to the [facility] - he initially  lost another 8 pounds since last visit but gained them back  Emilie Harden, MD PhD [facility] Endocrinology

## 2023-11-24 ENCOUNTER — Ambulatory Visit: Admitting: Nurse Practitioner

## 2023-11-26 ENCOUNTER — Encounter: Payer: Self-pay | Admitting: Nurse Practitioner

## 2023-11-26 ENCOUNTER — Ambulatory Visit: Admitting: Nurse Practitioner

## 2023-11-26 VITALS — BP 139/82 | HR 78 | Temp 98.0°F | Ht 75.0 in | Wt 252.0 lb

## 2023-11-26 DIAGNOSIS — E6609 Other obesity due to excess calories: Secondary | ICD-10-CM

## 2023-11-26 DIAGNOSIS — Z6831 Body mass index (BMI) 31.0-31.9, adult: Secondary | ICD-10-CM | POA: Diagnosis not present

## 2023-11-26 DIAGNOSIS — E66811 Obesity, class 1: Secondary | ICD-10-CM | POA: Diagnosis not present

## 2023-11-26 DIAGNOSIS — Z794 Long term (current) use of insulin: Secondary | ICD-10-CM

## 2023-11-26 DIAGNOSIS — N184 Chronic kidney disease, stage 4 (severe): Secondary | ICD-10-CM

## 2023-11-26 DIAGNOSIS — E1122 Type 2 diabetes mellitus with diabetic chronic kidney disease: Secondary | ICD-10-CM | POA: Diagnosis not present

## 2023-11-26 DIAGNOSIS — Z7985 Long-term (current) use of injectable non-insulin antidiabetic drugs: Secondary | ICD-10-CM

## 2023-11-26 NOTE — Progress Notes (Signed)
 Office: [phone]  /  Fax: [phone]  WEIGHT SUMMARY AND BIOMETRICS  Weight Lost Since Last Visit: 1lb  Weight Gained Since Last Visit: 0lb   Vitals Temp: 98 F (36.7 C) BP: 139/82 Pulse Rate: 78 SpO2: 98 %   Anthropometric Measurements Height: 6\' 3"  (1.905 m) Weight: 252 lb (114.3 kg) BMI (Calculated): 31.5 Weight at Last Visit: 253lb Weight Lost Since Last Visit: 1lb Weight Gained Since Last Visit: 0lb Starting Weight: 266lb Total Weight Loss (lbs): 14 lb (6.35 kg)   Body Composition  Body Fat %: 33.5 % Fat Mass (lbs): 84.6 lbs Muscle Mass (lbs): 160 lbs Total Body Water (lbs): 115.6 lbs Visceral Fat Rating : 16   Other Clinical Data Fasting: No Labs: No Today's Visit #: 14 Starting Date: 01/22/23     HPI  Chief Complaint: OBESITY  Alexander [name] is here to discuss his progress with his obesity treatment plan. He is on the the Category 2 Plan and states he is following his eating plan approximately 80 % of the time. He states he is exercising 30 minutes 2-3 days per week.   Interval History:  Since last office visit he has lost 1 pound. He is leaving for a cruise this weekend. He is averaging around 1500-2000 calories and is unsure of protein intake. He hasn't been diligent with tracking.  Sometimes will use mynetdiary.  His wife was recently diagnosed with breast cancer.  He is drinking water and a protein shake. He is walking at work.  Denies polyphagia or cravings.   BF:  protein shake and sometimes eggs with toast Snack:  chocolate, 100 cal packs, cheese Lunch:  varies:  eats at [employer], chicken, pizza and chips Snack:  fruit Dinner:  varies: protein and vegetable Drinks:  water, OJ (occ) or apple juice  Pharmacotherapy for weight loss: He is not currently taking medications  for medical weight loss.     Bariatric surgery:  Patient has not had bariatric surgery.   Pharmacotherapy for DMT2:   He is currently taking Ozempic  1 mg and Tresiba   46 units at bedtime.  Denies side effects.   He saw endo last on 10/27/23, cardiology last on 08/15/23 and nephrology on 06/25/23.  Has appt with nephrology 12/25/23. Last A1c was 6.5 12/24 and last creat was 2.47 on 06/25/23 (see in care everywhere) CBGs: 93-130 Episodes of hypoglycemia: Denies  On statin-lipitor 40mg  & ASA 81mg . ACE stopped due to elevated creat  Last eye exam:  Jan 2025   Lab Results  Component Value Date   HGBA1C 6.4 (A) 11/03/2023   HGBA1C 7.1 (H) 01/22/2023   HGBA1C 8.3 (A) 10/22/2022   Lab Results  Component Value Date   MICROALBUR 32.0 (H) 12/05/2022   LDLCALC 96 01/22/2023   CREATININE 2.78 (H) 01/22/2023      PHYSICAL EXAM:  Blood pressure 139/82, pulse 78, temperature 98 F (36.7 C), height 6\' 3"  (1.905 m), weight 252 lb (114.3 kg), SpO2 98%. Body mass index is 31.5 kg/m.  General: He is overweight, cooperative, alert, well developed, and in no acute distress. PSYCH: Has normal mood, affect and thought process.   Extremities: No edema.  Neurologic: No gross sensory or motor deficits. No tremors or fasciculations noted.    DIAGNOSTIC DATA REVIEWED:  BMET    Component Value Date/Time   NA 142 01/22/2023 0953   K 4.6 01/22/2023 0953   CL 104 01/22/2023 0953   CO2 23 01/22/2023 0953   GLUCOSE 118 (H) 01/22/2023 2956  GLUCOSE 97 12/05/2022 0928   BUN 36 (H) 01/22/2023 0953   CREATININE 2.78 (H) 01/22/2023 0953   CREATININE 2.04 (H) 04/07/2015 1641   CALCIUM  9.3 01/22/2023 0953   GFRNONAA 33 (L) 04/05/2017 0537   GFRAA 38 (L) 04/05/2017 0537   Lab Results  Component Value Date   HGBA1C 6.4 (A) 11/03/2023   HGBA1C 12.1 (H) 10/27/2006   Lab Results  Component Value Date   INSULIN  5.2 01/22/2023   Lab Results  Component Value Date   TSH 1.210 01/22/2023   CBC    Component Value Date/Time   WBC 8.1 01/22/2023 0953   WBC 9.4 11/30/2021 1151   RBC 4.69 01/22/2023 0953   RBC 4.73 11/30/2021 1151   HGB 12.4 (L) 01/22/2023 0953   HCT  37.8 01/22/2023 0953   PLT 279 01/22/2023 0953   MCV 81 01/22/2023 0953   MCH 26.4 (L) 01/22/2023 0953   MCH 26.8 04/05/2017 0537   MCHC 32.8 01/22/2023 0953   MCHC 33.1 11/30/2021 1151   RDW 12.8 01/22/2023 0953   Iron Studies No results found for: "IRON", "TIBC", "FERRITIN", "IRONPCTSAT" Lipid Panel     Component Value Date/Time   CHOL 155 01/22/2023 0953   TRIG 74 01/22/2023 0953   HDL 45 01/22/2023 0953   CHOLHDL 4 12/05/2022 0928   VLDL 19.6 12/05/2022 0928   LDLCALC 96 01/22/2023 0953   LDLDIRECT 164.0 07/15/2014 1703   Hepatic Function Panel     Component Value Date/Time   PROT 7.1 01/22/2023 0953   ALBUMIN 4.1 01/22/2023 0953   AST 25 01/22/2023 0953   ALT 27 01/22/2023 0953   ALKPHOS 81 01/22/2023 0953   BILITOT 0.5 01/22/2023 0953   BILIDIR 0.1 01/08/2011 1012      Component Value Date/Time   TSH 1.210 01/22/2023 0953   Nutritional Lab Results  Component Value Date   VD25OH 52.6 01/22/2023   VD25OH 52.43 11/30/2021     ASSESSMENT AND PLAN  TREATMENT PLAN FOR OBESITY:  Recommended Dietary Goals  Daiquan is currently in the action stage of change. As such, his goal is to continue weight management plan. He has agreed to start tracking and will review at next visit.  Discussed the importance of monitoring macros.  Behavioral Intervention  We discussed the following Behavioral Modification Strategies today: increasing lean protein intake to established goals, decreasing simple carbohydrates , increasing vegetables, increasing fiber rich foods, increasing water intake , work on meal planning and preparation, work on tracking and journaling calories using tracking application, and continue to work on maintaining a reduced calorie state, getting the recommended amount of protein, incorporating whole foods, making healthy choices, staying well hydrated and practicing mindfulness when eating..  Additional resources provided today: NA  Recommended Physical  Activity Goals  Elsworth has been advised to work up to 150 minutes of moderate intensity aerobic activity a week and strengthening exercises 2-3 times per week for cardiovascular health, weight loss maintenance and preservation of muscle mass.   He has agreed to Think about enjoyable ways to increase daily physical activity and overcoming barriers to exercise, Increase physical activity in their day and reduce sedentary time (increase NEAT)., Start strengthening exercises with a goal of 2-3 sessions a week , and Increase the intensity, frequency or duration of aerobic exercises      ASSOCIATED CONDITIONS ADDRESSED TODAY  Action/Plan  Diabetes mellitus with stage 4 chronic kidney disease (HCC) Continue to follow-up with endocrinology and PCP.  Continue medications as directed.  His Ozempic  was recently increased to 1 mg and he is currently doing well.  Last A1c has improved.  Good blood sugar control is important to decrease the likelihood of diabetic complications such as nephropathy, neuropathy, limb loss, blindness, coronary artery disease, and death. Intensive lifestyle modification including diet, exercise and weight loss are the first line of treatment for diabetes.    Class 1 obesity due to excess calories with serious comorbidity and body mass index (BMI) of 31.0 to 31.9 in adult         Return in about 4 weeks (around 12/24/2023).Aaron Aas He was informed of the importance of frequent follow up visits to maximize his success with intensive lifestyle modifications for his multiple health conditions.   ATTESTASTION STATEMENTS:  Reviewed by clinician on day of visit: allergies, medications, problem list, medical history, surgical history, family history, social history, and previous encounter notes.   Time spent on visit including pre-visit chart review and post-visit care and charting was 30 minutes discussing meal plan, macros, exercise, water intake, stress with wife's new diagnosis of  breast cancer.    Crist Dominion. Avarose Mervine FNP-C

## 2023-12-23 ENCOUNTER — Ambulatory Visit: Admitting: Nurse Practitioner

## 2023-12-23 ENCOUNTER — Encounter: Payer: Self-pay | Admitting: Nurse Practitioner

## 2023-12-23 VITALS — BP 113/72 | HR 74 | Temp 98.1°F | Ht 75.0 in | Wt 251.0 lb

## 2023-12-23 DIAGNOSIS — Z7985 Long-term (current) use of injectable non-insulin antidiabetic drugs: Secondary | ICD-10-CM

## 2023-12-23 DIAGNOSIS — E6609 Other obesity due to excess calories: Secondary | ICD-10-CM

## 2023-12-23 DIAGNOSIS — E1122 Type 2 diabetes mellitus with diabetic chronic kidney disease: Secondary | ICD-10-CM | POA: Diagnosis not present

## 2023-12-23 DIAGNOSIS — E66811 Obesity, class 1: Secondary | ICD-10-CM | POA: Diagnosis not present

## 2023-12-23 DIAGNOSIS — N184 Chronic kidney disease, stage 4 (severe): Secondary | ICD-10-CM | POA: Diagnosis not present

## 2023-12-23 DIAGNOSIS — Z6831 Body mass index (BMI) 31.0-31.9, adult: Secondary | ICD-10-CM | POA: Diagnosis not present

## 2023-12-23 DIAGNOSIS — Z794 Long term (current) use of insulin: Secondary | ICD-10-CM

## 2023-12-23 NOTE — Progress Notes (Signed)
 Office: [phone]  /  Fax: [phone]  WEIGHT SUMMARY AND BIOMETRICS  Weight Lost Since Last Visit: 1lb  Weight Gained Since Last Visit: 0lb   Vitals Temp: 98.1 F (36.7 C) BP: 113/72 Pulse Rate: 74 SpO2: 98 %   Anthropometric Measurements Height: 6' 3 (1.905 m) Weight: 251 lb (113.9 kg) BMI (Calculated): 31.37 Weight at Last Visit: 252lb Weight Lost Since Last Visit: 1lb Weight Gained Since Last Visit: 0lb Starting Weight: 266lb Total Weight Loss (lbs): 15 lb (6.804 kg)   Body Composition  Body Fat %: 33.2 % Fat Mass (lbs): 83.6 lbs Muscle Mass (lbs): 160 lbs Total Body Water (lbs): 114 lbs Visceral Fat Rating : 16   Other Clinical Data Fasting: No Labs: No Today's Visit #: 15 Starting Date: 01/22/23     HPI  Chief Complaint: OBESITY  Alexander [name] is here to discuss his progress with his obesity treatment plan. He is on the the Category 2 Plan and states he is following his eating plan approximately 85 % of the time. He states he is exercising 25 minutes 3 days per week.   Interval History:  Since last office visit he has lost 1 pound.  He went on a cruise since his last visit.   His wife recently had lumpectomy for breast cancer and will be starting radiation. He is not tracking his calories and protein intake.  He is drinking water daily.   He is walking to stay active.    Pharmacotherapy for weight loss: He is not currently taking medications  for medical weight loss.     Bariatric surgery:  Patient has not had bariatric surgery.   Pharmacotherapy for DMT2:   He is currently taking Ozempic  1 mg and Tresiba  46 units at bedtime.  Denies side effects.   He saw endo last on 11/03/23, cardiology last on 08/15/23 and nephrology on 06/25/23.  Has appt with nephrology 12/25/23. Last A1c was 6.4 on 11/03/23 and last creat was 2.47 on 06/25/23 (see in care everywhere) CBGs: 93-130s Episodes of hypoglycemia: Denies  On statin-lipitor 40mg  & ASA 81mg . ACE  stopped due to elevated creat  Last eye exam:  Jan 2025   Lab Results  Component Value Date   HGBA1C 6.4 (A) 11/03/2023   HGBA1C 7.1 (H) 01/22/2023   HGBA1C 8.3 (A) 10/22/2022   Lab Results  Component Value Date   MICROALBUR 32.0 (H) 12/05/2022   LDLCALC 96 01/22/2023   CREATININE 2.78 (H) 01/22/2023      PHYSICAL EXAM:  Blood pressure 113/72, pulse 74, temperature 98.1 F (36.7 C), height 6' 3 (1.905 m), weight 251 lb (113.9 kg), SpO2 98%. Body mass index is 31.37 kg/m.  General: He is overweight, cooperative, alert, well developed, and in no acute distress. PSYCH: Has normal mood, affect and thought process.   Extremities: No edema.  Neurologic: No gross sensory or motor deficits. No tremors or fasciculations noted.    DIAGNOSTIC DATA REVIEWED:  BMET    Component Value Date/Time   NA 142 01/22/2023 0953   K 4.6 01/22/2023 0953   CL 104 01/22/2023 0953   CO2 23 01/22/2023 0953   GLUCOSE 118 (H) 01/22/2023 0953   GLUCOSE 97 12/05/2022 0928   BUN 36 (H) 01/22/2023 0953   CREATININE 2.78 (H) 01/22/2023 0953   CREATININE 2.04 (H) 04/07/2015 1641   CALCIUM  9.3 01/22/2023 0953   GFRNONAA 33 (L) 04/05/2017 0537   GFRAA 38 (L) 04/05/2017 0537   Lab Results  Component Value Date  HGBA1C 6.4 (A) 11/03/2023   HGBA1C 12.1 (H) 10/27/2006   Lab Results  Component Value Date   INSULIN  5.2 01/22/2023   Lab Results  Component Value Date   TSH 1.210 01/22/2023   CBC    Component Value Date/Time   WBC 8.1 01/22/2023 0953   WBC 9.4 11/30/2021 1151   RBC 4.69 01/22/2023 0953   RBC 4.73 11/30/2021 1151   HGB 12.4 (L) 01/22/2023 0953   HCT 37.8 01/22/2023 0953   PLT 279 01/22/2023 0953   MCV 81 01/22/2023 0953   MCH 26.4 (L) 01/22/2023 0953   MCH 26.8 04/05/2017 0537   MCHC 32.8 01/22/2023 0953   MCHC 33.1 11/30/2021 1151   RDW 12.8 01/22/2023 0953   Iron Studies No results found for: IRON, TIBC, FERRITIN, IRONPCTSAT Lipid Panel     Component Value  Date/Time   CHOL 155 01/22/2023 0953   TRIG 74 01/22/2023 0953   HDL 45 01/22/2023 0953   CHOLHDL 4 12/05/2022 0928   VLDL 19.6 12/05/2022 0928   LDLCALC 96 01/22/2023 0953   LDLDIRECT 164.0 07/15/2014 1703   Hepatic Function Panel     Component Value Date/Time   PROT 7.1 01/22/2023 0953   ALBUMIN 4.1 01/22/2023 0953   AST 25 01/22/2023 0953   ALT 27 01/22/2023 0953   ALKPHOS 81 01/22/2023 0953   BILITOT 0.5 01/22/2023 0953   BILIDIR 0.1 01/08/2011 1012      Component Value Date/Time   TSH 1.210 01/22/2023 0953   Nutritional Lab Results  Component Value Date   VD25OH 52.6 01/22/2023   VD25OH 52.43 11/30/2021     ASSESSMENT AND PLAN  TREATMENT PLAN FOR OBESITY:  Recommended Dietary Goals  Alexander [name] is currently in the action stage of change. As such, his goal is to continue weight management plan. He has agreed to the Category 2 Plan.  Behavioral Intervention  We discussed the following Behavioral Modification Strategies today: increasing lean protein intake to established goals, decreasing simple carbohydrates , increasing vegetables, increasing fiber rich foods, increasing water intake , work on meal planning and preparation, reading food labels , keeping healthy foods at home, and continue to work on maintaining a reduced calorie state, getting the recommended amount of protein, incorporating whole foods, making healthy choices, staying well hydrated and practicing mindfulness when eating..  Additional resources provided today: NA  Recommended Physical Activity Goals  Alexander [name] has been advised to work up to 150 minutes of moderate intensity aerobic activity a week and strengthening exercises 2-3 times per week for cardiovascular health, weight loss maintenance and preservation of muscle mass.   He has agreed to Think about enjoyable ways to increase daily physical activity and overcoming barriers to exercise, Increase physical activity in their day and reduce sedentary  time (increase NEAT)., Start strengthening exercises with a goal of 2-3 sessions a week , and continue to gradually increase the amount and intensity of exercise routine    ASSOCIATED CONDITIONS ADDRESSED TODAY  Action/Plan  Diabetes mellitus with stage 4 chronic kidney disease (HCC) Continue to follow up with endo and nephrology.  Take meds as directed  Class 1 obesity due to excess calories with serious comorbidity and body mass index (BMI) of 31.0 to 31.9 in adult       Will obtain labs at next visit  Goals: Start resistance training  Return in about 4 weeks (around 01/20/2024).Alexander [name] He was informed of the importance of frequent follow up visits to maximize his success with intensive lifestyle modifications  for his multiple health conditions.   ATTESTASTION STATEMENTS:  Reviewed by clinician on day of visit: allergies, medications, problem list, medical history, surgical history, family history, social history, and previous encounter notes.   Time spent on visit including pre-visit chart review and post-visit care and charting was 30 minutes.    Alexander Dominion. Kelvin Sennett FNP-C

## 2023-12-31 ENCOUNTER — Other Ambulatory Visit: Payer: Self-pay | Admitting: Internal Medicine

## 2024-01-26 ENCOUNTER — Ambulatory Visit: Admitting: Nurse Practitioner

## 2024-02-04 ENCOUNTER — Encounter: Payer: Self-pay | Admitting: Nurse Practitioner

## 2024-02-04 ENCOUNTER — Ambulatory Visit: Admitting: Nurse Practitioner

## 2024-02-04 VITALS — BP 131/89 | HR 76 | Temp 98.2°F | Ht 75.0 in | Wt 255.0 lb

## 2024-02-04 DIAGNOSIS — E1122 Type 2 diabetes mellitus with diabetic chronic kidney disease: Secondary | ICD-10-CM

## 2024-02-04 DIAGNOSIS — Z794 Long term (current) use of insulin: Secondary | ICD-10-CM

## 2024-02-04 DIAGNOSIS — Z6831 Body mass index (BMI) 31.0-31.9, adult: Secondary | ICD-10-CM

## 2024-02-04 DIAGNOSIS — E6609 Other obesity due to excess calories: Secondary | ICD-10-CM

## 2024-02-04 DIAGNOSIS — E66811 Obesity, class 1: Secondary | ICD-10-CM

## 2024-02-04 DIAGNOSIS — N184 Chronic kidney disease, stage 4 (severe): Secondary | ICD-10-CM | POA: Diagnosis not present

## 2024-02-04 DIAGNOSIS — Z7985 Long-term (current) use of injectable non-insulin antidiabetic drugs: Secondary | ICD-10-CM

## 2024-02-04 NOTE — Progress Notes (Signed)
 Office: [phone]  /  Fax: [phone]  WEIGHT SUMMARY AND BIOMETRICS  Weight Lost Since Last Visit: 0lb  Weight Gained Since Last Visit: 4lb   Vitals Temp: 98.2 F (36.8 C) BP: 131/89 Pulse Rate: 76 SpO2: 99 %   Anthropometric Measurements Height: 6' 3 (1.905 m) Weight: 255 lb (115.7 kg) BMI (Calculated): 31.87 Weight at Last Visit: 251lb Weight Lost Since Last Visit: 0lb Weight Gained Since Last Visit: 4lb Starting Weight: 266lb Total Weight Loss (lbs): 11 lb (4.99 kg)   Body Composition  Body Fat %: 34.2 % Fat Mass (lbs): 87.4 lbs Muscle Mass (lbs): 159.8 lbs Total Body Water (lbs): 117 lbs Visceral Fat Rating : 17   Other Clinical Data Fasting: Yes Labs: No Today's Visit #: 16 Starting Date: 01/22/23     HPI  Chief Complaint: OBESITY  Alexander [name] is here to discuss his progress with his obesity treatment plan. Alexander [name] is on the the Category 2 Plan and states Alexander [name] is following his eating plan approximately 60 % of the time. Alexander [name] states Alexander [name] is exercising 20 minutes 2-3 days per week.   Interval History:  Since last office visit Alexander [name] has gained 4 pounds.  Alexander [name] has had a lot going on over the past several weeks.  His work has gotten busier and his wife had to have another surgery.  Alexander [name] feels that things are starting to settle down.  Alexander [name] is walking to stay active.  Alexander [name] is drinking water and occ juice.    Pharmacotherapy for weight loss: Alexander [name] is not currently taking medications  for medical weight loss.     Bariatric surgery:  Patient has not had bariatric surgery.    Pharmacotherapy for DMT2:   Alexander [name] is currently taking Ozempic  1 mg and Tresiba  46 units at bedtime.  Denies side effects.   Alexander [name] saw endo last on 11/03/23, cardiology last on 08/15/23 and nephrology on 12/25/23. Has appt scheduled with endo on 03/04/24 and nephrology 06/25/24. Last A1c was 6.4 on 11/03/23 and last creat was 2.86 (see in care everywhere).  CBGs: 81-145 Episodes of hypoglycemia: None On statin-lipitor  40mg , losartan 25mg  & ASA 81mg . ACE stopped by nephrology-recently started Losartan Last eye exam:  Jan 2025 Alexander [name] was previously on metformin , Levemir , Trulicity . Alexander [name] was previously on Fiasp  8 to 10 units before meals  Lab Results  Component Value Date   HGBA1C 6.4 (A) 11/03/2023   HGBA1C 7.1 (H) 01/22/2023   HGBA1C 8.3 (A) 10/22/2022   Lab Results  Component Value Date   MICROALBUR 0.4 07/21/2008   LDLCALC 96 01/22/2023   CREATININE 2.78 (H) 01/22/2023      PHYSICAL EXAM:  Blood pressure 131/89, pulse 76, temperature 98.2 F (36.8 C), height 6' 3 (1.905 m), weight 255 lb (115.7 kg), SpO2 99%. Body mass index is 31.87 kg/m.  General: Alexander [name] is overweight, cooperative, alert, well developed, and in no acute distress. PSYCH: Has normal mood, affect and thought process.   Extremities: No edema.  Neurologic: No gross sensory or motor deficits. No tremors or fasciculations noted.    DIAGNOSTIC DATA REVIEWED:  BMET    Component Value Date/Time   NA 142 01/22/2023 0953   K 4.6 01/22/2023 0953   CL 104 01/22/2023 0953   CO2 23 01/22/2023 0953   GLUCOSE 118 (H) 01/22/2023 0953   GLUCOSE 97 12/05/2022 0928   BUN 36 (H) 01/22/2023 0953   CREATININE 2.78 (H) 01/22/2023 0953   CREATININE 2.04 (H) 04/07/2015 1641   CALCIUM  9.3  01/22/2023 0953   GFRNONAA 33 (L) 04/05/2017 0537   GFRAA 38 (L) 04/05/2017 0537   Lab Results  Component Value Date   HGBA1C 6.4 (A) 11/03/2023   HGBA1C 12.1 (H) 10/27/2006   Lab Results  Component Value Date   INSULIN  5.2 01/22/2023   Lab Results  Component Value Date   TSH 1.210 01/22/2023   CBC    Component Value Date/Time   WBC 8.1 01/22/2023 0953   WBC 9.4 11/30/2021 1151   RBC 4.69 01/22/2023 0953   RBC 4.73 11/30/2021 1151   HGB 12.4 (L) 01/22/2023 0953   HCT 37.8 01/22/2023 0953   PLT 279 01/22/2023 0953   MCV 81 01/22/2023 0953   MCH 26.4 (L) 01/22/2023 0953   MCH 26.8 04/05/2017 0537   MCHC 32.8 01/22/2023 0953   MCHC 33.1  11/30/2021 1151   RDW 12.8 01/22/2023 0953   Iron Studies No results found for: IRON, TIBC, FERRITIN, IRONPCTSAT Lipid Panel     Component Value Date/Time   CHOL 155 01/22/2023 0953   TRIG 74 01/22/2023 0953   HDL 45 01/22/2023 0953   CHOLHDL 4 12/05/2022 0928   VLDL 19.6 12/05/2022 0928   LDLCALC 96 01/22/2023 0953   LDLDIRECT 164.0 07/15/2014 1703   Hepatic Function Panel     Component Value Date/Time   PROT 7.1 01/22/2023 0953   ALBUMIN 4.1 01/22/2023 0953   AST 25 01/22/2023 0953   ALT 27 01/22/2023 0953   ALKPHOS 81 01/22/2023 0953   BILITOT 0.5 01/22/2023 0953   BILIDIR 0.1 01/08/2011 1012      Component Value Date/Time   TSH 1.210 01/22/2023 0953   Nutritional Lab Results  Component Value Date   VD25OH 52.6 01/22/2023   VD25OH 52.43 11/30/2021     ASSESSMENT AND PLAN  TREATMENT PLAN FOR OBESITY:  Recommended Dietary Goals  Alexander [name] is currently in the action stage of change. As such, his goal is to continue weight management plan. Alexander [name] has agreed to the Category 2 Plan.  Behavioral Intervention  We discussed the following Behavioral Modification Strategies today: increasing lean protein intake to established goals, decreasing simple carbohydrates , increasing vegetables, increasing fiber rich foods, increasing water intake , work on meal planning and preparation, reading food labels , keeping healthy foods at home, and continue to work on maintaining a reduced calorie state, getting the recommended amount of protein, incorporating whole foods, making healthy choices, staying well hydrated and practicing mindfulness when eating..  Additional resources provided today: NA  Recommended Physical Activity Goals  Alexander [name] has been advised to work up to 150 minutes of moderate intensity aerobic activity a week and strengthening exercises 2-3 times per week for cardiovascular health, weight loss maintenance and preservation of muscle mass.   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  Continue medications as directed. Alexander [name] recently had labs on 12/24/2023.  See in care everywhere. Alexander [name] has an appoint with endocrinology on 03/04/2024. If Alexander [name] does not have a TSH or lipids obtained during that appointment, I will obtain at his next visit.  Class 1 obesity due to excess calories with serious comorbidity and body mass index (BMI)  of 31.0 to 31.9 in adult     Again discussed adding in resistance training along with cardio.  Patient reports some wheezing over the past couple days.  Lungs were clear and good breath sounds noted.  Suggested seeing PCP for follow up.    Return in about 4 weeks (around 03/03/2024).SABRA Alexander [name] was informed of the importance of frequent follow up visits to maximize his success with intensive lifestyle modifications for his multiple health conditions.   ATTESTASTION STATEMENTS:  Reviewed by clinician on day of visit: allergies, medications, problem list, medical history, surgical history, family history, social history, and previous encounter notes.     Corean SAUNDERS. Alexander Dempster FNP-C

## 2024-02-07 ENCOUNTER — Other Ambulatory Visit: Payer: Self-pay | Admitting: Internal Medicine

## 2024-03-02 ENCOUNTER — Ambulatory Visit: Admitting: Nurse Practitioner

## 2024-03-02 ENCOUNTER — Encounter: Payer: Self-pay | Admitting: Nurse Practitioner

## 2024-03-02 VITALS — BP 109/71 | HR 78 | Temp 97.9°F | Ht 75.0 in | Wt 253.0 lb

## 2024-03-02 DIAGNOSIS — E66811 Obesity, class 1: Secondary | ICD-10-CM | POA: Diagnosis not present

## 2024-03-02 DIAGNOSIS — N184 Chronic kidney disease, stage 4 (severe): Secondary | ICD-10-CM

## 2024-03-02 DIAGNOSIS — E1122 Type 2 diabetes mellitus with diabetic chronic kidney disease: Secondary | ICD-10-CM

## 2024-03-02 DIAGNOSIS — Z6831 Body mass index (BMI) 31.0-31.9, adult: Secondary | ICD-10-CM | POA: Diagnosis not present

## 2024-03-02 DIAGNOSIS — Z7985 Long-term (current) use of injectable non-insulin antidiabetic drugs: Secondary | ICD-10-CM

## 2024-03-02 DIAGNOSIS — E6609 Other obesity due to excess calories: Secondary | ICD-10-CM

## 2024-03-02 DIAGNOSIS — Z794 Long term (current) use of insulin: Secondary | ICD-10-CM

## 2024-03-02 NOTE — Progress Notes (Signed)
 Office: [phone]  /  Fax: [phone]  WEIGHT SUMMARY AND BIOMETRICS  Weight Lost Since Last Visit: 2lb  Weight Gained Since Last Visit: 0lb   Vitals Temp: 97.9 F (36.6 C) BP: 109/71 Pulse Rate: 78 SpO2: 98 %   Anthropometric Measurements Height: 6' 3 (1.905 m) Weight: 253 lb (114.8 kg) BMI (Calculated): 31.62 Weight at Last Visit: 255lb Weight Lost Since Last Visit: 2lb Weight Gained Since Last Visit: 0lb Starting Weight: 266lb Total Weight Loss (lbs): 13 lb (5.897 kg)   Body Composition  Body Fat %: 34 % Fat Mass (lbs): 86 lbs Muscle Mass (lbs): 159 lbs Total Body Water (lbs): 116.6 lbs Visceral Fat Rating : 17   Other Clinical Data Fasting: Yes Labs: No Today's Visit #: 17 Starting Date: 01/22/23     HPI  Chief Complaint: OBESITY  Alexander [name] is here to discuss his progress with his obesity treatment plan. He is on the the Category 2 Plan and states he is following his eating plan approximately 60 % of the time. He states he is exercising 20-30 minutes 2-3 days per week.   Interval History:  Since last office visit he has lost 2 pounds.  He went to the beach this past weekend for a family reunion.  He trying to not eat after 9pm. Finds he snacks after 9pm.  He is trying to make healthier choices and watching his portion sizes.  He is eating smaller meals and is concerned he's not eating enough calories.  He is drinking water and a fairlife protein shake a couple days per week.  He is decreasing sugary drinks.  He has been trying to move more and is trying to be more active at work.  He is walking 2-3 days per week.  Notes some polyphagia and cravings.    Pharmacotherapy for weight loss: He is not currently taking medications  for medical weight loss.     Bariatric surgery:  Patient has not had bariatric surgery.   Pharmacotherapy for DMT2:   He is currently taking Ozempic  1 mg and Tresiba  46 units at bedtime.  Denies side effects.   He saw endo  last on 11/03/23, cardiology last on 08/15/23 and nephrology on 12/25/23. Has appt scheduled with endo on 03/04/24 and nephrology 06/25/24. Last A1c was 6.4 on 11/03/23 and last creat was 2.86 on 12/24/23 (see in care everywhere).  CBGs: 98-140 Episodes of hypoglycemia: Denies  On statin-lipitor 40mg , losartan 25mg  & ASA 81mg . ACE stopped by nephrology Last eye exam:  Jan 2025 He was previously on metformin , Levemir  and Trulicity . He was previously on Fiasp  8 to 10 units before meals  Lab Results  Component Value Date   HGBA1C 6.4 (A) 11/03/2023   HGBA1C 7.1 (H) 01/22/2023   HGBA1C 8.3 (A) 10/22/2022   Lab Results  Component Value Date   MICROALBUR 0.4 07/21/2008   LDLCALC 96 01/22/2023   CREATININE 2.78 (H) 01/22/2023       PHYSICAL EXAM:  Blood pressure 109/71, pulse 78, temperature 97.9 F (36.6 C), height 6' 3 (1.905 m), weight 253 lb (114.8 kg), SpO2 98%. Body mass index is 31.62 kg/m.  General: He is overweight, cooperative, alert, well developed, and in no acute distress. PSYCH: Has normal mood, affect and thought process.   Extremities: No edema.  Neurologic: No gross sensory or motor deficits. No tremors or fasciculations noted.    DIAGNOSTIC DATA REVIEWED:  BMET    Component Value Date/Time   NA 142 01/22/2023 0953  K 4.6 01/22/2023 0953   CL 104 01/22/2023 0953   CO2 23 01/22/2023 0953   GLUCOSE 118 (H) 01/22/2023 0953   GLUCOSE 97 12/05/2022 0928   BUN 36 (H) 01/22/2023 0953   CREATININE 2.78 (H) 01/22/2023 0953   CREATININE 2.04 (H) 04/07/2015 1641   CALCIUM  9.3 01/22/2023 0953   GFRNONAA 33 (L) 04/05/2017 0537   GFRAA 38 (L) 04/05/2017 0537   Lab Results  Component Value Date   HGBA1C 6.4 (A) 11/03/2023   HGBA1C 12.1 (H) 10/27/2006   Lab Results  Component Value Date   INSULIN  5.2 01/22/2023   Lab Results  Component Value Date   TSH 1.210 01/22/2023   CBC    Component Value Date/Time   WBC 8.1 01/22/2023 0953   WBC 9.4 11/30/2021 1151    RBC 4.69 01/22/2023 0953   RBC 4.73 11/30/2021 1151   HGB 12.4 (L) 01/22/2023 0953   HCT 37.8 01/22/2023 0953   PLT 279 01/22/2023 0953   MCV 81 01/22/2023 0953   MCH 26.4 (L) 01/22/2023 0953   MCH 26.8 04/05/2017 0537   MCHC 32.8 01/22/2023 0953   MCHC 33.1 11/30/2021 1151   RDW 12.8 01/22/2023 0953   Iron Studies No results found for: IRON, TIBC, FERRITIN, IRONPCTSAT Lipid Panel     Component Value Date/Time   CHOL 155 01/22/2023 0953   TRIG 74 01/22/2023 0953   HDL 45 01/22/2023 0953   CHOLHDL 4 12/05/2022 0928   VLDL 19.6 12/05/2022 0928   LDLCALC 96 01/22/2023 0953   LDLDIRECT 164.0 07/15/2014 1703   Hepatic Function Panel     Component Value Date/Time   PROT 7.1 01/22/2023 0953   ALBUMIN 4.1 01/22/2023 0953   AST 25 01/22/2023 0953   ALT 27 01/22/2023 0953   ALKPHOS 81 01/22/2023 0953   BILITOT 0.5 01/22/2023 0953   BILIDIR 0.1 01/08/2011 1012      Component Value Date/Time   TSH 1.210 01/22/2023 0953   Nutritional Lab Results  Component Value Date   VD25OH 52.6 01/22/2023   VD25OH 52.43 11/30/2021     ASSESSMENT AND PLAN  TREATMENT PLAN FOR OBESITY:  Recommended Dietary Goals  Alexander [name] is currently in the action stage of change. As such, his goal is to continue weight management plan. He has agreed to keeping a food journal and adhering to recommended goals of 1700 calories and 90+ grams of protein.  To send me calories and macros along with weight next week.   Behavioral Intervention  We discussed the following Behavioral Modification Strategies today: increasing lean protein intake to established goals, decreasing simple carbohydrates , increasing vegetables, increasing fiber rich foods, increasing water intake , work on meal planning and preparation, work on tracking and journaling calories using tracking application, continue to work on maintaining a reduced calorie state, getting the recommended amount of protein, incorporating whole foods,  making healthy choices, staying well hydrated and practicing mindfulness when eating., and increase protein intake, fibrous foods (25 grams per day for women, 30 grams for men) and water to improve satiety and decrease hunger signals. .  Additional resources provided today: NA  Recommended Physical Activity Goals  Alexander [name] has been advised to work up to 150 minutes of moderate intensity aerobic activity a week and strengthening exercises 2-3 times per week for cardiovascular health, weight loss maintenance and preservation of muscle mass.   He has agreed to Think about enjoyable ways to increase daily physical activity and overcoming barriers to exercise, Increase physical activity  in their day and reduce sedentary time (increase NEAT)., Increase the intensity, frequency or duration of aerobic exercises  , Increase volume of physical activity to a goal of 240 minutes a week, and Combine aerobic and strengthening exercises for efficiency and improved cardiometabolic health.   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  Keep follow-up appointment scheduled with both.  Continue medications as directed.   Class 1 obesity due to excess calories with serious comorbidity and body mass index (BMI) of 31.0 to 31.9 in adult      Goals: Start using treadmill and bike at home Use weighted vest when walking on treadmill  Will obtain fasting labs at next visit  Return in about 4 weeks (around 03/30/2024).SABRA He was informed of the importance of frequent follow up visits to maximize his success with intensive lifestyle modifications for his multiple health conditions.   ATTESTASTION STATEMENTS:  Reviewed by clinician on day of visit: allergies, medications, problem list, medical history, surgical history, family history, social history, and previous encounter notes.   Time spent on visit  including pre-visit chart review and post-visit care and charting was 30 minutes reviewing his chart before and after his visit, discussing nutrition, exercise and meal planning/prepping.    Alexander [name]. Shereena Berquist FNP-C

## 2024-03-04 ENCOUNTER — Encounter: Payer: Self-pay | Admitting: Internal Medicine

## 2024-03-04 ENCOUNTER — Ambulatory Visit: Admitting: Internal Medicine

## 2024-03-04 VITALS — BP 150/80 | HR 76 | Resp 20 | Ht 75.0 in | Wt 262.8 lb

## 2024-03-04 DIAGNOSIS — N184 Chronic kidney disease, stage 4 (severe): Secondary | ICD-10-CM

## 2024-03-04 DIAGNOSIS — E785 Hyperlipidemia, unspecified: Secondary | ICD-10-CM | POA: Diagnosis not present

## 2024-03-04 DIAGNOSIS — E1122 Type 2 diabetes mellitus with diabetic chronic kidney disease: Secondary | ICD-10-CM

## 2024-03-04 DIAGNOSIS — Z794 Long term (current) use of insulin: Secondary | ICD-10-CM | POA: Diagnosis not present

## 2024-03-04 DIAGNOSIS — E66811 Obesity, class 1: Secondary | ICD-10-CM | POA: Diagnosis not present

## 2024-03-04 LAB — POCT GLYCOSYLATED HEMOGLOBIN (HGB A1C): Hemoglobin A1C: 6.3 % — AB (ref 4.0–5.6)

## 2024-03-04 NOTE — Progress Notes (Signed)
 Patient ID: Alexander [name], male   DOB: 04/19/1971, 53 y.o.   MRN: [id number]  HPI: Alexander [name] is a 53 y.o.-year-old male, returning for follow-up for DM2, dx in 2004, insulin -dependent since 2013, uncontrolled, with complications (CHF, CKD, PN, DR). Pt. previously saw Dr. Kassie, last visit with me 4 months ago.  Interim history: No increased urination, blurry vision, chest pain. + some acid reflux. Also 1-2x nocturia. He has back pain labs sciatica and had to have steroids (p.o. and injections) for this in the past.  [facility]. His wife also goes there.  He stays active, walking at work.   Reviewed HbA1c levels: Lab Results  Component Value Date   HGBA1C 6.4 (A) 11/03/2023   HGBA1C 7.1 (H) 01/22/2023   HGBA1C 8.3 (A) 10/22/2022   HGBA1C 8.6 (A) 06/18/2022   HGBA1C 7.5 (A) 03/14/2022   HGBA1C 7.9 (A) 09/05/2021   HGBA1C 7.2 (A) 06/06/2021   HGBA1C 7.2 (A) 02/20/2021   HGBA1C 6.6 (A) 03/08/2020   HGBA1C 7.9 (A) 12/07/2019  06/27/2023: HbA1c 6.5%  He is on: - Tresiba  U200 60 >> 46 units at bedtime - Ozempic -started 10/2022: 0.25 >> 0.5 >> 1 mg weekly He was previously on metformin , Levemir , Trulicity . He was previously on Fiasp  8 to 10 units before meals but stopped when starting Ozempic   Pt checks his sugars 1-2x a day and they are: - am: 95-120 >> 84, 95-115, 124, 165 >> 70, 80-135, 152 >> 85, 95-130, 155 (no insulin ) - 2h after b'fast: n/c - before lunch: n/c >> 132 >> 79, 121, 280 >> 100-150 >> n/c - 2h after lunch: n/c >> 177 >> n/c - before dinner: n/c - 2h after dinner: n/c - bedtime: 151-203, 228 >> 159, 214-323 >> 130s >> n/c >> 130-140 - nighttime: n/c Lowest sugar was 79 >> 95 >> 84 >> 70 >> 85; he has hypoglycemia awareness at 80.  Highest sugar was 587 >> 378 >> 200 >> 180.  Glucometer: Freestyle lite  Pt's meals are: - Breakfast: protein shake - Lunch: largest meal: chicken tenders or salad - Dinner:  pasta, chicken fried or baked - Snacks: fruit He has an air fryer.  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  - last eye exam was on 07/11/2023. No DR.  - + numbness and tingling in his feet (L>R - 2/2 sciatica - improved after steroid inj.).  Last foot exam 02/25/2023. Has a podiatrist -goes to see him as needed.  He also has a history of HTN.  ROS: + see HPI  Past Medical History:  Diagnosis Date   CHEST PAIN 08/28/2009   DIABETES MELLITUS, TYPE II 03/20/2007   HYPERCHOLESTEROLEMIA 07/21/2008   HYPERTENSION 03/20/2007   Hypertensive urgency 03/27/2015   Left ventricular dysfunction 03/28/2015   EF 40-45%, no WMA, grade 2 diastolic dysfunction   Renal disorder    Past Surgical History:  Procedure Laterality Date   KNEE ARTHROSCOPY Right  Social History   Socioeconomic History   Marital status: Married    Spouse name: Not on file   Number of children: Not on file   Years of education: Not on file   Highest education level: Not on file  Occupational History   Occupation: English as a second language teacher: [employer]  Tobacco Use   Smoking status: Never   Smokeless tobacco: Never  Vaping Use   Vaping status: Never Used  Substance and Sexual Activity   Alcohol use: No   Drug use: No   Sexual activity: Not on file  Other Topics Concern   Not on file  Social History Narrative   Not on file   Social Drivers of Health   Financial Resource Strain: Not on file  Food Insecurity: Not on file  Transportation Needs: Not on file  Physical Activity: Not on file  Stress: Not on file   Social Connections: Not on file  Intimate Partner Violence: Not on file   Current Outpatient Medications on File Prior to Visit  Medication Sig Dispense Refill   aspirin  EC 81 MG EC tablet Take 1 tablet (81 mg total) by mouth daily.     atorvastatin  (LIPITOR) 40 MG tablet TAKE 1 TABLET BY MOUTH EVERY DAY 90 tablet 2   carvedilol  (COREG ) 12.5 MG tablet TAKE 1 TABLET BY MOUTH TWICE A DAY WITH FOOD 180 tablet 0   furosemide  (LASIX ) 20 MG tablet TAKE 1 TABLET (20 MG TOTAL) BY MOUTH DAILY AS NEEDED (FOR SHORTNESS OF BREATH AND SWELLING.). 30 tablet 0   glucose blood (FREESTYLE LITE) test strip 1 each by Other route 2 (two) times daily. And lancets 2/day 200 each 3   hydrALAZINE  (APRESOLINE ) 50 MG tablet Take 1 tablet by mouth 2 (two) times daily. (Patient taking differently: Take 1 tablet by mouth 2 (two) times daily. Taking 1/2 tablet twice daily)     insulin  degludec (TRESIBA  FLEXTOUCH) 200 UNIT/ML FlexTouch Pen Inject 46 units under skin daily     Insulin  Pen Needle (BD PEN NEEDLE NANO U/F) 32G X 4 MM MISC USE 4X DAILY AS DIRECTED 400 each 3   Lancets (FREESTYLE) lancets Use to monitor glucose levels BID; E11.22 100 each 12   losartan (COZAAR) 25 MG tablet Take 25 mg by mouth daily.     nitroGLYCERIN  (NITROSTAT ) 0.4 MG SL tablet Place 1 tablet (0.4 mg total) under the tongue every 5 (five) minutes x 3 doses as needed for chest pain. Don't take within 24 hrs of Cialis  25 tablet 0   Semaglutide , 1 MG/DOSE, (OZEMPIC , 1 MG/DOSE,) 4 MG/3ML SOPN Inject 1 mg into the skin once a week. 9 mL 3   tiZANidine  (ZANAFLEX ) 4 MG tablet Take 1 tablet (4 mg total) by mouth every 6 (six) hours as needed for muscle spasms. 30 tablet 0   Vitamin D , Ergocalciferol , (DRISDOL ) 1.25 MG (50000 UNIT) CAPS capsule Take 1 capsule (50,000 Units total) by mouth every Monday. 5 capsule 2   No current facility-administered medications on file prior to visit.   No Known Allergies Family History  Problem Relation Age of Onset    Diabetes Mother    Diabetes Father    Hypertension Father    Diabetes Maternal Grandfather    Diabetes Paternal Grandfather    Diabetes Brother    Hypertension Brother    PE: BP (!) 150/80   Pulse 76   Resp 20   Ht 6' 3 (1.905 m)   Wt 262 lb 12.8 oz (119.2  kg)   SpO2 96%   BMI 32.85 kg/m  Wt Readings from Last 11 Encounters:  03/04/24 262 lb 12.8 oz (119.2 kg)  03/02/24 253 lb (114.8 kg)  02/04/24 255 lb (115.7 kg)  12/23/23 251 lb (113.9 kg)  11/26/23 252 lb (114.3 kg)  11/03/23 261 lb (118.4 kg)  10/27/23 253 lb (114.8 kg)  09/23/23 254 lb (115.2 kg)  08/25/23 256 lb (116.1 kg)  08/15/23 262 lb (118.8 kg)  07/29/23 255 lb (115.7 kg)   Constitutional: overweight, in NAD Eyes:  EOMI, no exophthalmos ENT: no neck masses, no cervical lymphadenopathy Cardiovascular: RRR, No MRG Respiratory: CTA B Musculoskeletal: no deformities Skin:no rashes Neurological: no tremor with outstretched hands Diabetic Foot Exam - Simple   Simple Foot Form Diabetic Foot exam was performed with the following findings: Yes 03/04/2024  4:38 PM  Visual Inspection No deformities, no ulcerations, no other skin breakdown bilaterally: Yes Sensation Testing Intact to touch and monofilament testing bilaterally: Yes Pulse Check Posterior Tibialis and Dorsalis pulse intact bilaterally: Yes Comments + ingrown hallux toenails - L hallux nail absent    ASSESSMENT: 1. 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 HL  3.  Obesity class I  PLAN:  1. Patient with longstanding, uncontrolled, type 2 diabetes, on directable antidiabetic regimen with long-acting insulin  and weekly GLP-1 receptor agonist, with improved control.  At last visit, HbA1c was lower, at 6.4%, and sugars were controlled but he was only checking in the morning.  Some of this sugars are higher, above 130, possibly due to eating late.  We discussed that moving  dinners from 9 PM to approximately 7 PM and the eating lower glycemic index meals and snacks.  To help more with blood sugars but more so with weight, I suggested decreasing Tresiba  dose and increasing Ozempic  dose.  I also discussed with him about possibly using an SGLT2 inhibitor to help with both renal and cardiovascular outcomes.  I advised him to discuss with cardiology if this would be indicated for him, but we did not start, as I explained that at low GFR ranges, the effect of this class of medications on blood sugars is not very prominent.  Per review of his nephrology note from 12/2023, he was previously taken off RAAS inhibitor due to hyperkalemia.  He is being rechallenged with this.  The plan is to add an SGLT2 inhibitor if potassium allows.  Will hold off adding this for now, pending further input from nephrology. - At today's visit, sugars appear to be mostly at goal, with rare slight hyperglycemic exceptions.  He tolerates Ozempic  well, with only occasional reflux, which is not new for him.  For now, I did not recommend a change in regimen but we did discuss about possibly decreasing the dose of Tresiba  if sugars continue to improve.  I also recommended a CGM as he mentions that he would like to continue to make diet.  He is reticent to start this, but will think about it and let me know if he wants to start. - I suggested to:  Patient Instructions  Please continue: - Tresiba  U200 46 units at bedtime - Ozempic  1 mg weekly.  Think about a CGM - Libre or Dexcom.  Please return in 4-6 months.  - we checked his HbA1c: 6.3% (lower) - advised to check sugars at different times of the day - 4x a day, rotating check times - advised  for yearly eye exams >> he is UTD - return to clinic in 4 months  2. HL - Latest lipid panel showed an LDL above 55, but improved significantly from baseline, otherwise fractions at goal: Lab Results  Component Value Date   CHOL 155 01/22/2023   HDL 45  01/22/2023   LDLCALC 96 01/22/2023   LDLDIRECT 164.0 07/15/2014   TRIG 74 01/22/2023   CHOLHDL 4 12/05/2022  - He continues on Lipitor 40 mg daily without side effects  3.  Obesity class I - He lost 17 pounds after starting Ozempic   - [facility] goes to the [facility] - He initially lost 8 pounds but then gained them back before last visit - At today's visit, he is net +1 lb  Lela Fendt, MD PhD [facility]

## 2024-03-04 NOTE — Patient Instructions (Addendum)
 Please continue: - Tresiba  U200 46 units at bedtime - Ozempic  1 mg weekly.  Think about a CGM - Libre or Dexcom.  Please return in 4-6 months.

## 2024-03-04 NOTE — Addendum Note (Signed)
 Addended by: ARELIA DIETRICH SAILOR on: 03/04/2024 04:53 PM   Modules accepted: Orders

## 2024-03-30 ENCOUNTER — Encounter: Payer: Self-pay | Admitting: Nurse Practitioner

## 2024-03-30 ENCOUNTER — Ambulatory Visit: Admitting: Nurse Practitioner

## 2024-03-30 VITALS — BP 128/80 | HR 75 | Temp 98.5°F | Ht 75.0 in | Wt 254.0 lb

## 2024-03-30 DIAGNOSIS — Z6831 Body mass index (BMI) 31.0-31.9, adult: Secondary | ICD-10-CM

## 2024-03-30 DIAGNOSIS — E1122 Type 2 diabetes mellitus with diabetic chronic kidney disease: Secondary | ICD-10-CM

## 2024-03-30 DIAGNOSIS — Z79899 Other long term (current) drug therapy: Secondary | ICD-10-CM

## 2024-03-30 DIAGNOSIS — E66811 Obesity, class 1: Secondary | ICD-10-CM

## 2024-03-30 DIAGNOSIS — E6609 Other obesity due to excess calories: Secondary | ICD-10-CM

## 2024-03-30 DIAGNOSIS — E785 Hyperlipidemia, unspecified: Secondary | ICD-10-CM

## 2024-03-30 DIAGNOSIS — N184 Chronic kidney disease, stage 4 (severe): Secondary | ICD-10-CM

## 2024-03-30 DIAGNOSIS — E1169 Type 2 diabetes mellitus with other specified complication: Secondary | ICD-10-CM | POA: Diagnosis not present

## 2024-03-30 DIAGNOSIS — Z7985 Long-term (current) use of injectable non-insulin antidiabetic drugs: Secondary | ICD-10-CM

## 2024-03-30 DIAGNOSIS — R5383 Other fatigue: Secondary | ICD-10-CM

## 2024-03-30 DIAGNOSIS — Z794 Long term (current) use of insulin: Secondary | ICD-10-CM

## 2024-03-30 NOTE — Progress Notes (Signed)
 Office: [phone]  /  Fax: [phone]  WEIGHT SUMMARY AND BIOMETRICS  Weight Lost Since Last Visit: 0lb  Weight Gained Since Last Visit: 1lb   Vitals Temp: 98.5 F (36.9 C) BP: 128/80 Pulse Rate: 75 SpO2: 99 %   Anthropometric Measurements Height: 6' 3 (1.905 m) Weight: 254 lb (115.2 kg) BMI (Calculated): 31.75 Weight at Last Visit: 253lb Weight Lost Since Last Visit: 0lb Weight Gained Since Last Visit: 1lb Starting Weight: 266lb Total Weight Loss (lbs): 12 lb (5.443 kg)   Body Composition  Body Fat %: 34.5 % Fat Mass (lbs): 87.6 lbs Muscle Mass (lbs): 158.4 lbs Total Body Water (lbs): 115.6 lbs Visceral Fat Rating : 17   Other Clinical Data Fasting: No Labs: No Today's Visit #: 18 Starting Date: 01/22/23     HPI  Chief Complaint: OBESITY  Alexander [name] is here to discuss his progress with his obesity treatment plan. He is on the the Category 2 Plan and states he is following his eating plan approximately 50 % of the time. He states he is exercising 10-15 minutes 1-2 days per week.   Interval History:  Since last office visit he has gained 1 pound. He has been tracking and is averaging around 1500-1700 calories, 110-275 carbs and 63-102 grams of protein.  Rarely notes polyphagia and cravings.  He is drinking more water, limiting juices and a protein shake daily.  He is limiting tea.  He is walking 1-2 days per week.    He is going to the beach in October.    Pharmacotherapy for weight loss: He is not currently taking medications  for medical weight loss.     Bariatric surgery:  Patient has not had bariatric surgery.   Pharmacotherapy for DMT2:   He is currently taking Ozempic  1 mg and Tresiba  46 units at bedtime.  Notes some side effects of fatigue.   He saw endo last on 03/04/24, cardiology last on 08/15/23 and nephrology on 12/25/23. Has appt scheduled with endo on 09/06/24 and nephrology 06/25/24. Last A1c was 6.3 on 03/04/24 and last creat was 2.86 on  12/24/23 (see in care everywhere).  CBGs: 98-140 Episodes of hypoglycemia/lows: 70, 79-tends to note side effects if < 80 On statin-lipitor 40mg , losartan 25mg  & ASA 81mg . ACE stopped by nephrology Last eye exam:  Jan 2025 He was previously on metformin , Levemir  and Trulicity . He was previously on Fiasp  8 to 10 units before meals    Lab Results  Component Value Date   HGBA1C 6.3 (A) 03/04/2024   HGBA1C 6.4 (A) 11/03/2023   HGBA1C 7.1 (H) 01/22/2023   Lab Results  Component Value Date   MICROALBUR 0.4 07/21/2008   LDLCALC 96 01/22/2023   CREATININE 2.78 (H) 01/22/2023    Hyperlipidemia Medication(s): Lipitor 40mg . Denies side effects.    Lab Results  Component Value Date   CHOL 155 01/22/2023   HDL 45 01/22/2023   LDLCALC 96 01/22/2023   LDLDIRECT 164.0 07/15/2014   TRIG 74 01/22/2023   CHOLHDL 4 12/05/2022   Lab Results  Component Value Date   ALT 27 01/22/2023   AST 25 01/22/2023   ALKPHOS 81 01/22/2023   BILITOT 0.5 01/22/2023   The 10-year ASCVD risk score (Arnett DK, et al., 2019) is: 16.8%   Values used to calculate the score:     Age: 15 years     Clincally relevant sex: Male     Is Non-Hispanic African American: Yes     Diabetic: Yes  Tobacco smoker: No     Systolic Blood Pressure: 128 mmHg     Is BP treated: Yes     HDL Cholesterol: 45 mg/dL     Total Cholesterol: 155 mg/dL   PHYSICAL EXAM:  Blood pressure 128/80, pulse 75, temperature 98.5 F (36.9 C), height 6' 3 (1.905 m), weight 254 lb (115.2 kg), SpO2 99%. Body mass index is 31.75 kg/m.  General: He is overweight, cooperative, alert, well developed, and in no acute distress. PSYCH: Has normal mood, affect and thought process.   Extremities: No edema.  Neurologic: No gross sensory or motor deficits. No tremors or fasciculations noted.    DIAGNOSTIC DATA REVIEWED:  BMET    Component Value Date/Time   NA 142 01/22/2023 0953   K 4.6 01/22/2023 0953   CL 104 01/22/2023 0953   CO2 23  01/22/2023 0953   GLUCOSE 118 (H) 01/22/2023 0953   GLUCOSE 97 12/05/2022 0928   BUN 36 (H) 01/22/2023 0953   CREATININE 2.78 (H) 01/22/2023 0953   CREATININE 2.04 (H) 04/07/2015 1641   CALCIUM  9.3 01/22/2023 0953   GFRNONAA 33 (L) 04/05/2017 0537   GFRAA 38 (L) 04/05/2017 0537   Lab Results  Component Value Date   HGBA1C 6.3 (A) 03/04/2024   HGBA1C 12.1 (H) 10/27/2006   Lab Results  Component Value Date   INSULIN  5.2 01/22/2023   Lab Results  Component Value Date   TSH 1.210 01/22/2023   CBC    Component Value Date/Time   WBC 8.1 01/22/2023 0953   WBC 9.4 11/30/2021 1151   RBC 4.69 01/22/2023 0953   RBC 4.73 11/30/2021 1151   HGB 12.4 (L) 01/22/2023 0953   HCT 37.8 01/22/2023 0953   PLT 279 01/22/2023 0953   MCV 81 01/22/2023 0953   MCH 26.4 (L) 01/22/2023 0953   MCH 26.8 04/05/2017 0537   MCHC 32.8 01/22/2023 0953   MCHC 33.1 11/30/2021 1151   RDW 12.8 01/22/2023 0953   Iron Studies No results found for: IRON, TIBC, FERRITIN, IRONPCTSAT Lipid Panel     Component Value Date/Time   CHOL 155 01/22/2023 0953   TRIG 74 01/22/2023 0953   HDL 45 01/22/2023 0953   CHOLHDL 4 12/05/2022 0928   VLDL 19.6 12/05/2022 0928   LDLCALC 96 01/22/2023 0953   LDLDIRECT 164.0 07/15/2014 1703   Hepatic Function Panel     Component Value Date/Time   PROT 7.1 01/22/2023 0953   ALBUMIN 4.1 01/22/2023 0953   AST 25 01/22/2023 0953   ALT 27 01/22/2023 0953   ALKPHOS 81 01/22/2023 0953   BILITOT 0.5 01/22/2023 0953   BILIDIR 0.1 01/08/2011 1012      Component Value Date/Time   TSH 1.210 01/22/2023 0953   Nutritional Lab Results  Component Value Date   VD25OH 52.6 01/22/2023   VD25OH 52.43 11/30/2021     ASSESSMENT AND PLAN  TREATMENT PLAN FOR OBESITY:  Recommended Dietary Goals  Kevion is currently in the action stage of change. As such, his goal is to continue weight management plan. He has agreed to the Category 3 Plan.  Behavioral  Intervention  We discussed the following Behavioral Modification Strategies today: increasing lean protein intake to established goals, decreasing simple carbohydrates , increasing vegetables, increasing fiber rich foods, increasing water intake , work on meal planning and preparation, reading food labels , keeping healthy foods at home, continue to work on maintaining a reduced calorie state, getting the recommended amount of protein, incorporating whole foods, making healthy  choices, staying well hydrated and practicing mindfulness when eating., and increase protein intake, fibrous foods (25 grams per day for women, 30 grams for men) and water to improve satiety and decrease hunger signals. .  Additional resources provided today: NA  Recommended Physical Activity Goals  Cyncere has been advised to work up to 150 minutes of moderate intensity aerobic activity a week and strengthening exercises 2-3 times per week for cardiovascular health, weight loss maintenance and preservation of muscle mass.   He has agreed to Think about enjoyable ways to increase daily physical activity and overcoming barriers to exercise, Increase physical activity in their day and reduce sedentary time (increase NEAT)., Work on scheduling and tracking physical activity. , Continue to gradually increase the amount and intensity of exercise routine, Increase volume of physical activity to a goal of 240 minutes a week, and Combine aerobic and strengthening exercises for efficiency and improved cardiometabolic health.    ASSOCIATED CONDITIONS ADDRESSED TODAY  Action/Plan  Diabetes mellitus with stage 4 chronic kidney disease (HCC) Continue to follow-up with endocrinology and take medications as directed.  After reviewing his macros, his carbs are high and his protein is low.  We discussed this extensively today.  Continue tracking and also monitor his blood sugars.  Will review at next visit  Hyperlipidemia associated with  type 2 diabetes mellitus (HCC) -     Lipid Panel With LDL/HDL Ratio  Continue to follow-up with PCP and take Lipitor 40 mg as directed.  Other fatigue -     TSH -     Vitamin B12 -     Comprehensive metabolic panel with GFR  Medication management -     TSH -     Vitamin B12 -     Comprehensive metabolic panel with GFR  Class 1 obesity due to excess calories with serious comorbidity and body mass index (BMI) of 31.0 to 31.9 in adult     We once again discussed the significant of exercising especially how it will improve his diabetes and help with blood sugar control.  He does have a treadmill and bike at home.  We also discussed in the past of using a weighted vest when walking on his treadmill.    Return in about 4 weeks (around 04/27/2024).SABRA He was informed of the importance of frequent follow up visits to maximize his success with intensive lifestyle modifications for his multiple health conditions.   ATTESTASTION STATEMENTS:  Reviewed by clinician on day of visit: allergies, medications, problem list, medical history, surgical history, family history, social history, and previous encounter notes.     Corean SAUNDERS. Tyshae Stair FNP-C

## 2024-03-31 ENCOUNTER — Ambulatory Visit: Payer: Self-pay | Admitting: Nurse Practitioner

## 2024-03-31 LAB — COMPREHENSIVE METABOLIC PANEL WITH GFR
ALT: 22 IU/L (ref 0–44)
AST: 23 IU/L (ref 0–40)
Albumin: 4.1 g/dL (ref 3.8–4.9)
Alkaline Phosphatase: 79 IU/L (ref 47–123)
BUN/Creatinine Ratio: 14 (ref 9–20)
BUN: 39 mg/dL — ABNORMAL HIGH (ref 6–24)
Bilirubin Total: 0.4 mg/dL (ref 0.0–1.2)
CO2: 19 mmol/L — ABNORMAL LOW (ref 20–29)
Calcium: 9.2 mg/dL (ref 8.7–10.2)
Chloride: 105 mmol/L (ref 96–106)
Creatinine, Ser: 2.7 mg/dL — ABNORMAL HIGH (ref 0.76–1.27)
Globulin, Total: 3.2 g/dL (ref 1.5–4.5)
Glucose: 119 mg/dL — ABNORMAL HIGH (ref 70–99)
Potassium: 5 mmol/L (ref 3.5–5.2)
Sodium: 141 mmol/L (ref 134–144)
Total Protein: 7.3 g/dL (ref 6.0–8.5)
eGFR: 27 mL/min/1.73 — ABNORMAL LOW (ref >59)

## 2024-03-31 LAB — LIPID PANEL WITH LDL/HDL RATIO
Cholesterol, Total: 143 mg/dL (ref 100–199)
HDL: 49 mg/dL (ref >39)
LDL Chol Calc (NIH): 83 mg/dL (ref 0–99)
LDL/HDL Ratio: 1.7 ratio (ref 0.0–3.6)
Triglycerides: 52 mg/dL (ref 0–149)
VLDL Cholesterol Cal: 11 mg/dL (ref 5–40)

## 2024-03-31 LAB — VITAMIN B12: Vitamin B-12: 501 pg/mL (ref 232–1245)

## 2024-03-31 LAB — TSH: TSH: 1 u[IU]/mL (ref 0.450–4.500)

## 2024-04-26 ENCOUNTER — Ambulatory Visit: Admitting: Nurse Practitioner

## 2024-05-06 ENCOUNTER — Ambulatory Visit: Admitting: Nurse Practitioner

## 2024-05-06 ENCOUNTER — Encounter: Payer: Self-pay | Admitting: Nurse Practitioner

## 2024-05-06 VITALS — BP 139/77 | HR 75 | Temp 97.9°F | Ht 75.0 in | Wt 254.0 lb

## 2024-05-06 DIAGNOSIS — Z6831 Body mass index (BMI) 31.0-31.9, adult: Secondary | ICD-10-CM

## 2024-05-06 DIAGNOSIS — E66811 Obesity, class 1: Secondary | ICD-10-CM

## 2024-05-06 DIAGNOSIS — E1122 Type 2 diabetes mellitus with diabetic chronic kidney disease: Secondary | ICD-10-CM | POA: Diagnosis not present

## 2024-05-06 DIAGNOSIS — E6609 Other obesity due to excess calories: Secondary | ICD-10-CM

## 2024-05-06 DIAGNOSIS — N184 Chronic kidney disease, stage 4 (severe): Secondary | ICD-10-CM | POA: Diagnosis not present

## 2024-05-06 DIAGNOSIS — Z7985 Long-term (current) use of injectable non-insulin antidiabetic drugs: Secondary | ICD-10-CM

## 2024-05-06 DIAGNOSIS — Z794 Long term (current) use of insulin: Secondary | ICD-10-CM

## 2024-05-06 NOTE — Progress Notes (Signed)
 Office: [phone]  /  Fax: [phone]  WEIGHT SUMMARY AND BIOMETRICS  Weight Lost Since Last Visit: 0lb  Weight Gained Since Last Visit: 0lb   Vitals Temp: 97.9 F (36.6 C) BP: 139/77 Pulse Rate: 75 SpO2: 99 %   Anthropometric Measurements Height: 6' 3 (1.905 m) Weight: 254 lb (115.2 kg) BMI (Calculated): 31.75 Weight at Last Visit: 254lb Weight Lost Since Last Visit: 0lb Weight Gained Since Last Visit: 0lb Starting Weight: 266lb Total Weight Loss (lbs): 12 lb (5.443 kg)   Body Composition  Body Fat %: 34.4 % Fat Mass (lbs): 87.6 lbs Muscle Mass (lbs): 158.8 lbs Total Body Water (lbs): 116.2 lbs Visceral Fat Rating : 17   Other Clinical Data Fasting: Yes Labs: No Today's Visit #: 19 Starting Date: 01/22/23     HPI  Chief Complaint: OBESITY  Alexander [name] is here to discuss his progress with his obesity treatment plan. He is on the the Category 2 Plan and states he is following his eating plan approximately 75 % of the time. He states he is exercising 30 minutes 2-3 days per week.   Interval History:  Since last office visit he has maintained his weight.  He went to the beach since his last visit. He is drinking water daily and occ juice or diet soda.   He is still struggling with back/sciatic/foot drop issues since 2023.  He has seen ortho in the past.  He is walking to stay active.  He parks father away and taking stairs more.  He is not walking as much during lunch at work as he was in the past.    Pharmacotherapy for weight loss: He is not currently taking medications  for medical weight loss.     Bariatric surgery:  Patient has not had bariatric surgery.  Pharmacotherapy for DMT2:   He is currently taking Ozempic  1 mg and Tresiba  46 units at bedtime.  Notes some side effects of fatigue but notes this has improved.  He saw endo last on 03/04/24, cardiology last on 08/15/23 and nephrology on 12/25/23. Has appt scheduled with endo on 09/06/24 and nephrology  06/25/24. Last A1c was 6.3 on 03/04/24 and last creat was 2.70 on 03/30/24 (sent labs to nephrology) CBGs: 93-142 Episodes of hypoglycemia/lows: None On lipitor 40mg , losartan 25mg  & ASA 81mg . ACE stopped by nephrology Last eye exam:  Jan 2025 He was previously on metformin , Levemir  and Trulicity . He was previously on Fiasp  8 to 10 units before meals    Lab Results  Component Value Date   HGBA1C 6.3 (A) 03/04/2024   HGBA1C 6.4 (A) 11/03/2023   HGBA1C 7.1 (H) 01/22/2023   Lab Results  Component Value Date   MICROALBUR 0.4 07/21/2008   LDLCALC 83 03/30/2024   CREATININE 2.70 (H) 03/30/2024      PHYSICAL EXAM:  Blood pressure 139/77, pulse 75, temperature 97.9 F (36.6 C), height 6' 3 (1.905 m), weight 254 lb (115.2 kg), SpO2 99%. Body mass index is 31.75 kg/m.  General: He is overweight, cooperative, alert, well developed, and in no acute distress. PSYCH: Has normal mood, affect and thought process.   Extremities: No edema.  Neurologic: No gross sensory or motor deficits. No tremors or fasciculations noted.    DIAGNOSTIC DATA REVIEWED:  BMET    Component Value Date/Time   NA 141 03/30/2024 1045   K 5.0 03/30/2024 1045   CL 105 03/30/2024 1045   CO2 19 (L) 03/30/2024 1045   GLUCOSE 119 (H) 03/30/2024 1045  GLUCOSE 97 12/05/2022 0928   BUN 39 (H) 03/30/2024 1045   CREATININE 2.70 (H) 03/30/2024 1045   CREATININE 2.04 (H) 04/07/2015 1641   CALCIUM  9.2 03/30/2024 1045   GFRNONAA 33 (L) 04/05/2017 0537   GFRAA 38 (L) 04/05/2017 0537   Lab Results  Component Value Date   HGBA1C 6.3 (A) 03/04/2024   HGBA1C 12.1 (H) 10/27/2006   Lab Results  Component Value Date   INSULIN  5.2 01/22/2023   Lab Results  Component Value Date   TSH 1.000 03/30/2024   CBC    Component Value Date/Time   WBC 8.1 01/22/2023 0953   WBC 9.4 11/30/2021 1151   RBC 4.69 01/22/2023 0953   RBC 4.73 11/30/2021 1151   HGB 12.4 (L) 01/22/2023 0953   HCT 37.8 01/22/2023 0953   PLT 279  01/22/2023 0953   MCV 81 01/22/2023 0953   MCH 26.4 (L) 01/22/2023 0953   MCH 26.8 04/05/2017 0537   MCHC 32.8 01/22/2023 0953   MCHC 33.1 11/30/2021 1151   RDW 12.8 01/22/2023 0953   Iron Studies No results found for: IRON, TIBC, FERRITIN, IRONPCTSAT Lipid Panel     Component Value Date/Time   CHOL 143 03/30/2024 1045   TRIG 52 03/30/2024 1045   HDL 49 03/30/2024 1045   CHOLHDL 4 12/05/2022 0928   VLDL 19.6 12/05/2022 0928   LDLCALC 83 03/30/2024 1045   LDLDIRECT 164.0 07/15/2014 1703   Hepatic Function Panel     Component Value Date/Time   PROT 7.3 03/30/2024 1045   ALBUMIN 4.1 03/30/2024 1045   AST 23 03/30/2024 1045   ALT 22 03/30/2024 1045   ALKPHOS 79 03/30/2024 1045   BILITOT 0.4 03/30/2024 1045   BILIDIR 0.1 01/08/2011 1012      Component Value Date/Time   TSH 1.000 03/30/2024 1045   Nutritional Lab Results  Component Value Date   VD25OH 52.6 01/22/2023   VD25OH 52.43 11/30/2021     ASSESSMENT AND PLAN  TREATMENT PLAN FOR OBESITY:  Recommended Dietary Goals  Alexander [name] is currently in the action stage of change. As such, his goal is to continue weight management plan. He has agreed to the Category 3 Plan.  Behavioral Intervention  We discussed the following Behavioral Modification Strategies today: increasing lean protein intake to established goals, decreasing simple carbohydrates , increasing vegetables, increasing fiber rich foods, increasing water intake , work on meal planning and preparation, work on tracking and journaling calories using tracking application, reading food labels , keeping healthy foods at home, continue to work on maintaining a reduced calorie state, getting the recommended amount of protein, incorporating whole foods, making healthy choices, staying well hydrated and practicing mindfulness when eating., and increase protein intake, fibrous foods (25 grams per day for women, 30 grams for men) and water to improve satiety and  decrease hunger signals. .  Additional resources provided today: NA  Recommended Physical Activity Goals  Alexander [name] has been advised to work up to 150 minutes of moderate intensity aerobic activity a week and strengthening exercises 2-3 times per week for cardiovascular health, weight loss maintenance and preservation of muscle mass.   He has agreed to Think about enjoyable ways to increase daily physical activity and overcoming barriers to exercise, Increase physical activity in their day and reduce sedentary time (increase NEAT)., Start strengthening exercises with a goal of 2-3 sessions a week , Continue to gradually increase the amount and intensity of exercise routine, Increase volume of physical activity to a goal of 240  minutes a week, and Combine aerobic and strengthening exercises for efficiency and improved cardiometabolic health.   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  Continue meds as directed  Labs reviewed in chart with patient Lab results were sent to nephrology by patient  Class 1 obesity due to excess calories with serious comorbidity and body mass index (BMI) of 31.0 to 31.9 in adult       Last labs reviewed with patient from 03/30/24  Bio Impedance reviewed with patient.    We once again discussed the significant of exercising especially how it will improve his diabetes and help with blood sugar control.  He does have a treadmill and bike at home.  We also discussed in the past of using a weighted vest when walking on his treadmill and adding in resistance training.   Return in about 4 weeks (around 06/03/2024).SABRA He was informed of the importance of frequent follow up visits to maximize his success with intensive lifestyle modifications for his multiple health conditions.   ATTESTASTION STATEMENTS:  Reviewed by clinician on day of visit: allergies, medications,  problem list, medical history, surgical history, family history, social history, and previous encounter notes.   I personally spent a total of 30+ minutes in the care of the patient today including preparing to see the patient, getting/reviewing separately obtained history, counseling and educating, documenting clinical information in the EHR, and communicating results.    Corean SAUNDERS. Tondalaya Perren FNP-C

## 2024-05-16 ENCOUNTER — Other Ambulatory Visit: Payer: Self-pay | Admitting: Internal Medicine

## 2024-06-15 ENCOUNTER — Ambulatory Visit: Admitting: Nurse Practitioner

## 2024-06-15 ENCOUNTER — Encounter: Payer: Self-pay | Admitting: Nurse Practitioner

## 2024-06-15 VITALS — BP 136/75 | HR 81 | Temp 98.1°F | Ht 75.0 in | Wt 256.0 lb

## 2024-06-15 DIAGNOSIS — E1122 Type 2 diabetes mellitus with diabetic chronic kidney disease: Secondary | ICD-10-CM

## 2024-06-15 DIAGNOSIS — E66811 Obesity, class 1: Secondary | ICD-10-CM

## 2024-06-15 NOTE — Progress Notes (Signed)
 Office: [phone]  /  Fax: [phone]  WEIGHT SUMMARY AND BIOMETRICS  Weight Lost Since Last Visit: 0lb  Weight Gained Since Last Visit: 2lb   Vitals Temp: 98.1 F (36.7 C) BP: 136/75 Pulse Rate: 81 SpO2: 97 %   Anthropometric Measurements Height: 6' 3 (1.905 m) Weight: 256 lb (116.1 kg) BMI (Calculated): 32 Weight at Last Visit: 254lb Weight Lost Since Last Visit: 0lb Weight Gained Since Last Visit: 2lb Starting Weight: 266lb Total Weight Loss (lbs): 10 lb (4.536 kg)   Body Composition  Body Fat %: 34.6 % Fat Mass (lbs): 88.8 lbs Muscle Mass (lbs): 159.8 lbs Total Body Water (lbs): 117.6 lbs Visceral Fat Rating : 17   Other Clinical Data Fasting: No Labs: No Today's Visit #: 20 Starting Date: 01/22/23     HPI  Chief Complaint: OBESITY  Christiano is here to discuss his progress with his obesity treatment plan. He is on the the Category 2 Plan and states he is following his eating plan approximately 50 % of the time. He states he is exercising 30 minutes 1-2 days per week.   Interval History:  Since last office visit he has gained 2 pounds.  He reports his portion sizes are smaller. He is drinking water and a protein shake daily.  He notes he is not currently exercising.  He says he's just going too much.  He is not walking at work like he was in the past.  He has a free gym at work and plans to start going.  He is struggling with left foot pain.    Pharmacotherapy for weight loss: He is not currently taking medications  for medical weight loss.     Bariatric surgery:  Patient has not had bariatric surgery.  Pharmacotherapy for DMT2:  He is currently taking Ozempic  1 mg and Tresiba  46 units at bedtime. Denies side effects.   He saw endo last on 03/04/24, cardiology last on 08/15/23 and nephrology on 12/25/23. Has appt scheduled with endo on 09/06/24 and nephrology 06/25/24. Last A1c was 6.3 on 03/04/24 and last creat was 2.70 on 03/30/24 (sent labs to  nephrology) CBGs: 85-160-mostly 100-115 Episodes of hypoglycemia/lows: Denies  On lipitor 40mg , losartan 25mg  & ASA 81mg . ACE was stopped by nephrology Last eye exam:  Jan 2025-plans to schedule He was previously on metformin , Levemir  and Trulicity . He was previously on Fiasp  8 to 10 units before meals      Lab Results  Component Value Date   HGBA1C 6.3 (A) 03/04/2024   HGBA1C 6.4 (A) 11/03/2023   HGBA1C 7.1 (H) 01/22/2023   Lab Results  Component Value Date   MICROALBUR 0.4 07/21/2008   LDLCALC 83 03/30/2024   CREATININE 2.70 (H) 03/30/2024     PHYSICAL EXAM:  Blood pressure 136/75, pulse 81, temperature 98.1 F (36.7 C), height 6' 3 (1.905 m), weight 256 lb (116.1 kg), SpO2 97%. Body mass index is 32 kg/m.  General: He is overweight, cooperative, alert, well developed, and in no acute distress. PSYCH: Has normal mood, affect and thought process.   Extremities: No edema.  Neurologic: No gross sensory or motor deficits. No tremors or fasciculations noted.    DIAGNOSTIC DATA REVIEWED:  BMET    Component Value Date/Time   NA 141 03/30/2024 1045   K 5.0 03/30/2024 1045   CL 105 03/30/2024 1045   CO2 19 (L) 03/30/2024 1045   GLUCOSE 119 (H) 03/30/2024 1045   GLUCOSE 97 12/05/2022 0928   BUN 39 (H)  03/30/2024 1045   CREATININE 2.70 (H) 03/30/2024 1045   CREATININE 2.04 (H) 04/07/2015 1641   CALCIUM  9.2 03/30/2024 1045   GFRNONAA 33 (L) 04/05/2017 0537   GFRAA 38 (L) 04/05/2017 0537   Lab Results  Component Value Date   HGBA1C 6.3 (A) 03/04/2024   HGBA1C 12.1 (H) 10/27/2006   Lab Results  Component Value Date   INSULIN  5.2 01/22/2023   Lab Results  Component Value Date   TSH 1.000 03/30/2024   CBC    Component Value Date/Time   WBC 8.1 01/22/2023 0953   WBC 9.4 11/30/2021 1151   RBC 4.69 01/22/2023 0953   RBC 4.73 11/30/2021 1151   HGB 12.4 (L) 01/22/2023 0953   HCT 37.8 01/22/2023 0953   PLT 279 01/22/2023 0953   MCV 81 01/22/2023 0953   MCH  26.4 (L) 01/22/2023 0953   MCH 26.8 04/05/2017 0537   MCHC 32.8 01/22/2023 0953   MCHC 33.1 11/30/2021 1151   RDW 12.8 01/22/2023 0953   Iron Studies No results found for: IRON, TIBC, FERRITIN, IRONPCTSAT Lipid Panel     Component Value Date/Time   CHOL 143 03/30/2024 1045   TRIG 52 03/30/2024 1045   HDL 49 03/30/2024 1045   CHOLHDL 4 12/05/2022 0928   VLDL 19.6 12/05/2022 0928   LDLCALC 83 03/30/2024 1045   LDLDIRECT 164.0 07/15/2014 1703   Hepatic Function Panel     Component Value Date/Time   PROT 7.3 03/30/2024 1045   ALBUMIN 4.1 03/30/2024 1045   AST 23 03/30/2024 1045   ALT 22 03/30/2024 1045   ALKPHOS 79 03/30/2024 1045   BILITOT 0.4 03/30/2024 1045   BILIDIR 0.1 01/08/2011 1012      Component Value Date/Time   TSH 1.000 03/30/2024 1045   Nutritional Lab Results  Component Value Date   VD25OH 52.6 01/22/2023   VD25OH 52.43 11/30/2021     ASSESSMENT AND PLAN  TREATMENT PLAN FOR OBESITY:  Recommended Dietary Goals  Dino is currently in the action stage of change. As such, his goal is to continue weight management plan. He has agreed to practicing portion control and making smarter food choices, such as increasing vegetables and decreasing simple carbohydrates.  Behavioral Intervention  We discussed the following Behavioral Modification Strategies today: increasing lean protein intake to established goals, decreasing simple carbohydrates , increasing vegetables, increasing fiber rich foods, increasing water intake , work on meal planning and preparation, reading food labels , keeping healthy foods at home, planning for success, celebration eating strategies, continue to work on maintaining a reduced calorie state, getting the recommended amount of protein, incorporating whole foods, making healthy choices, staying well hydrated and practicing mindfulness when eating., and increase protein intake, fibrous foods (25 grams per day for women, 30 grams  for men) and water to improve satiety and decrease hunger signals. .  Additional resources provided today: NA  Recommended Physical Activity Goals  Kyuss has been advised to work up to 150 minutes of moderate intensity aerobic activity a week and strengthening exercises 2-3 times per week for cardiovascular health, weight loss maintenance and preservation of muscle mass.   He has agreed to Think about enjoyable ways to increase daily physical activity and overcoming barriers to exercise, Increase physical activity in their day and reduce sedentary time (increase NEAT)., Start strengthening exercises with a goal of 2-3 sessions a week , Start aerobic activity with a goal of 150 minutes a week at moderate intensity. , Continue to gradually increase the amount  and intensity of exercise routine, Increase volume of physical activity to a goal of 240 minutes a week, and Combine aerobic and strengthening exercises for efficiency and improved cardiometabolic health.   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  To take meds as directed To discuss maximizing Ozempic  dose  Class 1 obesity due to excess calories with serious comorbidity and body mass index (BMI) of 32.0 to 32.9 in adult     Goals: Track some but not daily Increase water intake Exercise-start cardio and resistance training    Return in about 4 weeks (around 07/13/2024).SABRA He was informed of the importance of frequent follow up visits to maximize his success with intensive lifestyle modifications for his multiple health conditions.   ATTESTASTION STATEMENTS:  Reviewed by clinician on day of visit: allergies, medications, problem list, medical history, surgical history, family history, social history, and previous encounter notes.   I personally spent a total of 35 minutes in the care of the patient today including preparing to see the patient,  getting/reviewing separately obtained history, counseling and educating, and documenting clinical information in the EHR.    Corean SAUNDERS. Macari Zalesky FNP-C

## 2024-06-19 ENCOUNTER — Other Ambulatory Visit: Payer: Self-pay | Admitting: Internal Medicine

## 2024-07-09 LAB — OPHTHALMOLOGY REPORT-SCANNED

## 2024-07-21 ENCOUNTER — Ambulatory Visit: Admitting: Nurse Practitioner

## 2024-07-21 ENCOUNTER — Encounter: Payer: Self-pay | Admitting: Internal Medicine

## 2024-07-21 ENCOUNTER — Encounter: Payer: Self-pay | Admitting: Nurse Practitioner

## 2024-07-21 VITALS — BP 131/77 | HR 70 | Temp 98.0°F | Ht 75.0 in | Wt 250.0 lb

## 2024-07-21 DIAGNOSIS — Z794 Long term (current) use of insulin: Secondary | ICD-10-CM

## 2024-07-21 DIAGNOSIS — Z7985 Long-term (current) use of injectable non-insulin antidiabetic drugs: Secondary | ICD-10-CM

## 2024-07-21 DIAGNOSIS — E66811 Obesity, class 1: Secondary | ICD-10-CM

## 2024-07-21 DIAGNOSIS — N184 Chronic kidney disease, stage 4 (severe): Secondary | ICD-10-CM

## 2024-07-21 DIAGNOSIS — Z6831 Body mass index (BMI) 31.0-31.9, adult: Secondary | ICD-10-CM

## 2024-07-21 DIAGNOSIS — E1122 Type 2 diabetes mellitus with diabetic chronic kidney disease: Secondary | ICD-10-CM | POA: Diagnosis not present

## 2024-07-21 NOTE — Progress Notes (Signed)
 "  Office: [phone]  /  Fax: [phone]  WEIGHT SUMMARY AND BIOMETRICS  Weight Lost Since Last Visit: 6lb  Weight Gained Since Last Visit: 0lb   Vitals Temp: 98 F (36.7 C) BP: 131/77 Pulse Rate: 70 SpO2: 98 %   Anthropometric Measurements Height: 6' 3 (1.905 m) Weight: 250 lb (113.4 kg) BMI (Calculated): 31.25 Weight at Last Visit: 256lb Weight Lost Since Last Visit: 6lb Weight Gained Since Last Visit: 0lb Starting Weight: 266lb Total Weight Loss (lbs): 16 lb (7.258 kg)   Body Composition  Body Fat %: 34.1 % Fat Mass (lbs): 85.6 lbs Muscle Mass (lbs): 157 lbs Total Body Water (lbs): 114.8 lbs Visceral Fat Rating : 17   Other Clinical Data Fasting: No Labs: No Today's Visit #: 21 Starting Date: 01/22/23     HPI  Chief Complaint: OBESITY  Alexander [name] is here to discuss his progress with his obesity treatment plan. He is on the the Category 2 Plan and states he is following his eating plan approximately 70-80 % of the time. He states he is exercising 20 minutes 2-3 days per week.   Interval History:  Since last office visit he has lost 6 pounds. He is watching his portion sizes and stops eating when he is full instead of over eating. He is drinking a protein shake and water daily, occ diet soda.  He is snacking on fruit, applesauce, fruit cups.  He has been more active and walking more at work. 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  He feels that he is overall doing well with the new year.  He plans to start riding his bike and going to the gym.  Denies polyphagia or cravings.    No upcoming celebrations or vacations.   Pharmacotherapy for weight loss: He is not currently taking medications  for medical weight loss.     Bariatric surgery:  Patient has not had bariatric surgery.  Pharmacotherapy for DMT2:  He is currently taking Ozempic  1 mg and Tresiba  46 units at bedtime. Denies side effects.   He saw endo last on  03/04/24, cardiology last on 08/15/23 and nephrology on 12/25/23. Has appt scheduled with endo on 09/06/24 and nephrology 12/24/24.  He has sent a message to endo to discuss Ozempic  and Tresiba  doses.   Last A1c was 6.3 on 03/04/24 and last creat was 2.91 on 06/24/24 CBGs: 80-160 (once during Christmas after eating late at night) Episodes of hypoglycemia/lows: 80 & 86 On lipitor 40mg , losartan 25mg  & ASA 81mg . ACE was stopped by nephrology Last eye exam:  Jan 2026 He was previously on metformin , Levemir  and Trulicity . He was previously on Fiasp  8 to 10 units before meals    Lab Results  Component Value Date   HGBA1C 6.3 (A) 03/04/2024   HGBA1C 6.4 (A) 11/03/2023   HGBA1C 7.1 (H) 01/22/2023   Lab Results  Component Value Date   MICROALBUR 0.4 07/21/2008   LDLCALC 83 03/30/2024   CREATININE 2.70 (H) 03/30/2024        PHYSICAL EXAM:  Blood pressure 131/77, pulse 70, temperature 98 F (36.7 C), height 6' 3 (1.905 m), weight 250 lb (113.4 kg), SpO2 98%. Body mass index is 31.25 kg/m.  General: He is overweight, cooperative, alert, well developed, and in no acute distress. PSYCH: Has normal mood, affect and thought process.   Extremities: No edema.  Neurologic: No gross sensory or motor deficits. No tremors or fasciculations noted.    DIAGNOSTIC DATA  REVIEWED:  BMET    Component Value Date/Time   NA 141 03/30/2024 1045   K 5.0 03/30/2024 1045   CL 105 03/30/2024 1045   CO2 19 (L) 03/30/2024 1045   GLUCOSE 119 (H) 03/30/2024 1045   GLUCOSE 97 12/05/2022 0928   BUN 39 (H) 03/30/2024 1045   CREATININE 2.70 (H) 03/30/2024 1045   CREATININE 2.04 (H) 04/07/2015 1641   CALCIUM  9.2 03/30/2024 1045   GFRNONAA 33 (L) 04/05/2017 0537   GFRAA 38 (L) 04/05/2017 0537   Lab Results  Component Value Date   HGBA1C 6.3 (A) 03/04/2024   HGBA1C 12.1 (H) 10/27/2006   Lab Results  Component Value Date   INSULIN  5.2 01/22/2023   Lab Results  Component Value Date   TSH 1.000 03/30/2024    CBC    Component Value Date/Time   WBC 8.1 01/22/2023 0953   WBC 9.4 11/30/2021 1151   RBC 4.69 01/22/2023 0953   RBC 4.73 11/30/2021 1151   HGB 12.4 (L) 01/22/2023 0953   HCT 37.8 01/22/2023 0953   PLT 279 01/22/2023 0953   MCV 81 01/22/2023 0953   MCH 26.4 (L) 01/22/2023 0953   MCH 26.8 04/05/2017 0537   MCHC 32.8 01/22/2023 0953   MCHC 33.1 11/30/2021 1151   RDW 12.8 01/22/2023 0953   Iron Studies No results found for: IRON, TIBC, FERRITIN, IRONPCTSAT Lipid Panel     Component Value Date/Time   CHOL 143 03/30/2024 1045   TRIG 52 03/30/2024 1045   HDL 49 03/30/2024 1045   CHOLHDL 4 12/05/2022 0928   VLDL 19.6 12/05/2022 0928   LDLCALC 83 03/30/2024 1045   LDLDIRECT 164.0 07/15/2014 1703   Hepatic Function Panel     Component Value Date/Time   PROT 7.3 03/30/2024 1045   ALBUMIN 4.1 03/30/2024 1045   AST 23 03/30/2024 1045   ALT 22 03/30/2024 1045   ALKPHOS 79 03/30/2024 1045   BILITOT 0.4 03/30/2024 1045   BILIDIR 0.1 01/08/2011 1012      Component Value Date/Time   TSH 1.000 03/30/2024 1045   Nutritional Lab Results  Component Value Date   VD25OH 52.6 01/22/2023   VD25OH 52.43 11/30/2021     ASSESSMENT AND PLAN  TREATMENT PLAN FOR OBESITY:  Recommended Dietary Goals  Adisa is currently in the action stage of change. As such, his goal is to continue weight management plan. He has agreed to the Category 2 Plan.  Behavioral Intervention  We discussed the following Behavioral Modification Strategies today: decreasing simple carbohydrates , increasing vegetables, increasing fiber rich foods, work on meal planning and preparation, work on tracking and journaling calories using tracking application, reading food labels , keeping healthy foods at home, continue to work on maintaining a reduced calorie state, getting the recommended amount of protein, incorporating whole foods, making healthy choices, staying well hydrated and practicing  mindfulness when eating., and increase protein intake, fibrous foods (25 grams per day for women, 30 grams for men) and water to improve satiety and decrease hunger signals. .  Additional resources provided today: NA  Recommended Physical Activity Goals  Trai has been advised to work up to 150 minutes of moderate intensity aerobic activity a week and strengthening exercises 2-3 times per week for cardiovascular health, weight loss maintenance and preservation of muscle mass.   He has agreed to Think about enjoyable ways to increase daily physical activity and overcoming barriers to exercise, Increase physical activity in their day and reduce sedentary time (increase NEAT)., Work  on scheduling and tracking physical activity. , Continue to gradually increase the amount and intensity of exercise routine, Increase volume of physical activity to a goal of 240 minutes a week, and Combine aerobic and strengthening exercises for efficiency and improved cardiometabolic health.   ASSOCIATED CONDITIONS ADDRESSED TODAY  Action/Plan  Diabetes mellitus with stage 4 chronic kidney disease (HCC) He has reached out to endo about his medications and possible adjustments.  To call and sched a sooner appt with endo to discuss.    Class 1 obesity due to excess calories with serious comorbidity and body mass index (BMI) of 31.0 to 31.9 in adult     Goals: -Exercise-cardio and resistance training    Return in about 4 weeks (around 08/18/2024).SABRA He was informed of the importance of frequent follow up visits to maximize his success with intensive lifestyle modifications for his multiple health conditions.   ATTESTASTION STATEMENTS:  Reviewed by clinician on day of visit: allergies, medications, problem list, medical history, surgical history, family history, social history, and previous encounter notes.   I personally spent a total of 33 minutes in the care of the patient today including preparing to see  the patient, getting/reviewing separately obtained history, performing a medically appropriate exam/evaluation, counseling and educating, documenting clinical information in the EHR, independently interpreting results, and communicating results.    Corean SAUNDERS. Revia Nghiem FNP-C "

## 2024-08-24 ENCOUNTER — Ambulatory Visit: Admitting: Nurse Practitioner

## 2024-09-06 ENCOUNTER — Ambulatory Visit: Admitting: Internal Medicine
